# Patient Record
Sex: Female | Born: 1970
Health system: Southern US, Community
[De-identification: ages and names within clinical notes are randomized; demographics above are authoritative.]

## PROBLEM LIST (undated history)

## (undated) DIAGNOSIS — O139 Gestational [pregnancy-induced] hypertension without significant proteinuria, unspecified trimester: Secondary | ICD-10-CM

## (undated) DIAGNOSIS — Z789 Other specified health status: Secondary | ICD-10-CM

## (undated) DIAGNOSIS — E669 Obesity, unspecified: Secondary | ICD-10-CM

## (undated) DIAGNOSIS — R7303 Prediabetes: Secondary | ICD-10-CM

## (undated) DIAGNOSIS — M6740 Ganglion, unspecified site: Secondary | ICD-10-CM

## (undated) DIAGNOSIS — D649 Anemia, unspecified: Secondary | ICD-10-CM

## (undated) DIAGNOSIS — O09529 Supervision of elderly multigravida, unspecified trimester: Secondary | ICD-10-CM

## (undated) DIAGNOSIS — N200 Calculus of kidney: Secondary | ICD-10-CM

## (undated) DIAGNOSIS — E119 Type 2 diabetes mellitus without complications: Secondary | ICD-10-CM

## (undated) DIAGNOSIS — Z603 Acculturation difficulty: Secondary | ICD-10-CM

## (undated) DIAGNOSIS — Z758 Other problems related to medical facilities and other health care: Secondary | ICD-10-CM

## (undated) HISTORY — DX: Obesity, unspecified: E66.9

## (undated) HISTORY — DX: Prediabetes: R73.03

## (undated) HISTORY — PX: NO PAST SURGERIES: SHX2092

## (undated) HISTORY — DX: Ganglion, unspecified site: M67.40

## (undated) HISTORY — DX: Other problems related to medical facilities and other health care: Z75.8

## (undated) HISTORY — DX: Calculus of kidney: N20.0

## (undated) HISTORY — DX: Gestational (pregnancy-induced) hypertension without significant proteinuria, unspecified trimester: O13.9

## (undated) HISTORY — DX: Acculturation difficulty: Z60.3

## (undated) HISTORY — DX: Supervision of elderly multigravida, unspecified trimester: O09.529

## (undated) HISTORY — DX: Other specified health status: Z78.9

## (undated) HISTORY — DX: Anemia, unspecified: D64.9

## (undated) HISTORY — DX: Type 2 diabetes mellitus without complications: E11.9

---

## 2011-10-17 DIAGNOSIS — N200 Calculus of kidney: Secondary | ICD-10-CM

## 2011-10-17 HISTORY — DX: Calculus of kidney: N20.0

## 2011-10-17 NOTE — L&D Delivery Note (Signed)
I attended and precepted this delivery.  Agree with the above note.  Levie Heritage, DO 05/10/2012 8:26 PM

## 2011-10-17 NOTE — L&D Delivery Note (Signed)
Delivery Note At 7:07 PM a viable and healthy female was delivered via SVD (Presentation: left occiput anterior ).  APGAR: pending; weight pending.   Placenta status: intact.  Cord: 3 vessels with the following complications: thin cord.  Meconium present.  Anesthesia:  none Episiotomy: none Lacerations: none Suture Repair: n/a Est. Blood Loss (mL): 300  Mom to postpartum.  Baby to nursery-stable.  [name] 05/10/2012, 7:21 PM

## 2011-10-31 ENCOUNTER — Other Ambulatory Visit: Payer: Self-pay

## 2011-10-31 LAB — OB RESULTS CONSOLE RPR: RPR: NONREACTIVE

## 2011-10-31 LAB — OB RESULTS CONSOLE HIV ANTIBODY (ROUTINE TESTING): HIV: NONREACTIVE

## 2011-10-31 LAB — OB RESULTS CONSOLE GC/CHLAMYDIA: Chlamydia: NEGATIVE

## 2011-11-01 ENCOUNTER — Other Ambulatory Visit (HOSPITAL_COMMUNITY): Payer: Self-pay | Admitting: Nurse Practitioner

## 2011-11-01 DIAGNOSIS — O09529 Supervision of elderly multigravida, unspecified trimester: Secondary | ICD-10-CM

## 2011-11-01 DIAGNOSIS — Z3689 Encounter for other specified antenatal screening: Secondary | ICD-10-CM

## 2011-11-06 ENCOUNTER — Other Ambulatory Visit (HOSPITAL_COMMUNITY): Payer: Self-pay | Admitting: Nurse Practitioner

## 2011-11-06 ENCOUNTER — Ambulatory Visit (HOSPITAL_COMMUNITY)
Admission: RE | Admit: 2011-11-06 | Discharge: 2011-11-06 | Disposition: A | Discharge status: Home / Self Care | Payer: Self-pay | Source: Ambulatory Visit | Attending: Nurse Practitioner | Admitting: Nurse Practitioner

## 2011-11-06 DIAGNOSIS — Z3689 Encounter for other specified antenatal screening: Secondary | ICD-10-CM | POA: Insufficient documentation

## 2011-11-06 DIAGNOSIS — O09529 Supervision of elderly multigravida, unspecified trimester: Secondary | ICD-10-CM

## 2011-11-21 ENCOUNTER — Other Ambulatory Visit (HOSPITAL_COMMUNITY): Payer: Self-pay | Admitting: Physician Assistant

## 2011-11-21 DIAGNOSIS — Z3689 Encounter for other specified antenatal screening: Secondary | ICD-10-CM

## 2011-12-05 ENCOUNTER — Ambulatory Visit (HOSPITAL_COMMUNITY)
Admission: RE | Admit: 2011-12-05 | Discharge: 2011-12-05 | Disposition: A | Discharge status: Home / Self Care | Payer: Self-pay | Source: Ambulatory Visit | Attending: Physician Assistant | Admitting: Physician Assistant

## 2011-12-05 ENCOUNTER — Encounter (HOSPITAL_COMMUNITY): Payer: Self-pay

## 2011-12-05 DIAGNOSIS — O358XX0 Maternal care for other (suspected) fetal abnormality and damage, not applicable or unspecified: Secondary | ICD-10-CM | POA: Insufficient documentation

## 2011-12-05 DIAGNOSIS — Z1389 Encounter for screening for other disorder: Secondary | ICD-10-CM | POA: Insufficient documentation

## 2011-12-05 DIAGNOSIS — O09299 Supervision of pregnancy with other poor reproductive or obstetric history, unspecified trimester: Secondary | ICD-10-CM | POA: Insufficient documentation

## 2011-12-05 DIAGNOSIS — O09529 Supervision of elderly multigravida, unspecified trimester: Secondary | ICD-10-CM | POA: Insufficient documentation

## 2011-12-05 DIAGNOSIS — Z363 Encounter for antenatal screening for malformations: Secondary | ICD-10-CM | POA: Insufficient documentation

## 2011-12-05 DIAGNOSIS — Z3689 Encounter for other specified antenatal screening: Secondary | ICD-10-CM

## 2011-12-19 ENCOUNTER — Other Ambulatory Visit (HOSPITAL_COMMUNITY): Payer: Self-pay | Admitting: Physician Assistant

## 2011-12-19 DIAGNOSIS — Z0489 Encounter for examination and observation for other specified reasons: Secondary | ICD-10-CM

## 2011-12-26 ENCOUNTER — Ambulatory Visit (HOSPITAL_COMMUNITY): Payer: Self-pay

## 2011-12-27 ENCOUNTER — Encounter (HOSPITAL_COMMUNITY): Payer: Self-pay

## 2011-12-27 ENCOUNTER — Ambulatory Visit (HOSPITAL_COMMUNITY)
Admission: RE | Admit: 2011-12-27 | Discharge: 2011-12-27 | Disposition: A | Discharge status: Home / Self Care | Payer: Self-pay | Source: Ambulatory Visit | Attending: Physician Assistant | Admitting: Physician Assistant

## 2011-12-27 ENCOUNTER — Other Ambulatory Visit (HOSPITAL_COMMUNITY): Payer: Self-pay | Admitting: Physician Assistant

## 2011-12-27 DIAGNOSIS — O09529 Supervision of elderly multigravida, unspecified trimester: Secondary | ICD-10-CM | POA: Insufficient documentation

## 2011-12-27 DIAGNOSIS — Z363 Encounter for antenatal screening for malformations: Secondary | ICD-10-CM | POA: Insufficient documentation

## 2011-12-27 DIAGNOSIS — O09299 Supervision of pregnancy with other poor reproductive or obstetric history, unspecified trimester: Secondary | ICD-10-CM | POA: Insufficient documentation

## 2011-12-27 DIAGNOSIS — O280 Abnormal hematological finding on antenatal screening of mother: Secondary | ICD-10-CM

## 2011-12-27 DIAGNOSIS — Z0489 Encounter for examination and observation for other specified reasons: Secondary | ICD-10-CM

## 2011-12-27 DIAGNOSIS — Z1389 Encounter for screening for other disorder: Secondary | ICD-10-CM | POA: Insufficient documentation

## 2011-12-27 DIAGNOSIS — O289 Unspecified abnormal findings on antenatal screening of mother: Secondary | ICD-10-CM | POA: Insufficient documentation

## 2011-12-27 DIAGNOSIS — O358XX0 Maternal care for other (suspected) fetal abnormality and damage, not applicable or unspecified: Secondary | ICD-10-CM | POA: Insufficient documentation

## 2011-12-27 NOTE — Progress Notes (Addendum)
Genetic Counseling  High-Risk Gestation Note  Appointment Date:  12/27/2011 Referred By: Quentin Mulling, PA Date of Birth:  [birth year]   Pregnancy History: [id number] Estimated Date of Delivery: 05/06/12 Estimated Gestational Age: [redacted]w[redacted]d Attending: Particia Nearing, MD   Rebecca Sharp was seen for genetic counseling regarding a maternal age of 77 and an increased risk for Down syndrome based on Quad screening through [facility]. Farrel Gobble, [facility] medical interpreter, translated during our session today.  She was counseled regarding maternal age and the association with risk for chromosome conditions due to nondisjunction with aging of the ova.   We reviewed chromosomes, nondisjunction, and the associated 1 in 73 risk for fetal aneuploidy related to a maternal age of 60 at 21.[redacted] weeks gestation.  She was counseled that the risk for aneuploidy decreases as gestational age increases, accounting for those pregnancies which spontaneously abort.  We specifically discussed Down syndrome (trisomy 33), trisomies 41 and 46, and sex chromosome aneuploidies (47,XXX and 47,XXY) including the common features and prognoses of each.   We also reviewed Rebecca Sharp's maternal serum Quad screen result.  Although technically screen positive for Down syndrome, we discussed that the screen adjusted risk is just slightly higher than her term age related risk (1 in 12) for fetal Down syndrome.  She was counseled regarding other explanations for a screen positive result including normal variation and differences in maternal metabolism.  In addition, we reviewed the screen adjusted reduction in risks for trisomy 18 and ONTDs.  She understands that Quad screening provides a pregnancy specific risk for Down syndrome, but is not considered to be diagnostic.    She was counseled regarding other available screening and diagnostic options including ultrasound, NIPT, and amniocentesis.  Specifically, we  discussed that We discussed another type of screening test, noninvasive prenatal testing (NIPT), which utilizes cell free fetal DNA found in the maternal circulation. This test is not diagnostic for chromosome conditions, but can provide information regarding the presence or absence of extra fetal DNA for chromosomes 13, 18 and 21. Thus, it would not identify or rule out all fetal aneuploidy. The reported detection rate is greater than 99% for Trisomy 21, greater than 97% for Trisomy 18, and is approximately 80% (8 out of 10) for Trisomy 13. The false positive rate is reported to be less than 1% for any of these conditions. The risks, benefits, and limitations of each of these options were reviewed in detail.  After thoughtful consideration of these options, they elected to proceed with ultrasound, but declined NIPT and amniocentesis.  A complete detailed ultrasound was performed today.  The ultrasound report will be documented separately.  They understand that screening tests cannot rule out all birth defects or genetic syndromes.  The patient was advised of this limitation and states she still does not want diagnostic testing at this time.  However, they were counseled that 50-80% of fetuses with Down syndrome and up to 90% of fetuses with trisomies 13 and 18, when well visualized, have detectable anomalies or soft markers by ultrasound.   Rebecca Sharp was provided with written information regarding sickle cell anemia (SCA) including the carrier frequency and incidence in the Hispanic population, the availability of carrier testing and prenatal diagnosis if indicated.  In addition, we discussed that hemoglobinopathies are routinely screened for as part of the [facility] newborn screening panel.  She declined hemoglobin electrophoresis today.   Both family histories were reviewed and found to be noncontributory for birth defects, mental  retardation, and known genetic conditions. Without further information  regarding the provided family history, an accurate genetic risk cannot be calculated. Further genetic counseling is warranted if more information is obtained.  Rebecca Sharp denied exposure to environmental toxins or chemical agents. She denied the use of alcohol, tobacco or street drugs. She denied significant viral illnesses during the course of her pregnancy.   I counseled this patient regarding the above risks and available options.  The approximate face-to-face time with the genetic counselor was 65 minutes.    Despina Arias, MS Certified Genetic Counselor

## 2012-01-02 ENCOUNTER — Other Ambulatory Visit (HOSPITAL_COMMUNITY): Payer: Self-pay | Admitting: Family

## 2012-01-02 DIAGNOSIS — R319 Hematuria, unspecified: Secondary | ICD-10-CM

## 2012-01-05 ENCOUNTER — Ambulatory Visit (HOSPITAL_COMMUNITY)
Admission: RE | Admit: 2012-01-05 | Discharge: 2012-01-05 | Disposition: A | Discharge status: Home / Self Care | Payer: Self-pay | Source: Ambulatory Visit | Attending: Family | Admitting: Family

## 2012-01-05 DIAGNOSIS — O99891 Other specified diseases and conditions complicating pregnancy: Secondary | ICD-10-CM | POA: Insufficient documentation

## 2012-01-05 DIAGNOSIS — R319 Hematuria, unspecified: Secondary | ICD-10-CM | POA: Insufficient documentation

## 2012-02-13 ENCOUNTER — Other Ambulatory Visit (HOSPITAL_COMMUNITY): Payer: Self-pay | Admitting: Family

## 2012-02-13 DIAGNOSIS — Z1389 Encounter for screening for other disorder: Secondary | ICD-10-CM

## 2012-02-21 ENCOUNTER — Ambulatory Visit (HOSPITAL_COMMUNITY): Payer: Self-pay

## 2012-02-22 ENCOUNTER — Ambulatory Visit (HOSPITAL_COMMUNITY)
Admission: RE | Admit: 2012-02-22 | Discharge: 2012-02-22 | Disposition: A | Discharge status: Home / Self Care | Payer: Self-pay | Source: Ambulatory Visit | Attending: Family | Admitting: Family

## 2012-02-22 DIAGNOSIS — O09529 Supervision of elderly multigravida, unspecified trimester: Secondary | ICD-10-CM | POA: Insufficient documentation

## 2012-02-22 DIAGNOSIS — O289 Unspecified abnormal findings on antenatal screening of mother: Secondary | ICD-10-CM | POA: Insufficient documentation

## 2012-02-22 DIAGNOSIS — Z1389 Encounter for screening for other disorder: Secondary | ICD-10-CM

## 2012-02-22 DIAGNOSIS — O09299 Supervision of pregnancy with other poor reproductive or obstetric history, unspecified trimester: Secondary | ICD-10-CM | POA: Insufficient documentation

## 2012-02-27 ENCOUNTER — Other Ambulatory Visit (HOSPITAL_COMMUNITY): Payer: Self-pay | Admitting: Family

## 2012-02-27 DIAGNOSIS — Z1389 Encounter for screening for other disorder: Secondary | ICD-10-CM

## 2012-03-22 ENCOUNTER — Ambulatory Visit (HOSPITAL_COMMUNITY)
Admission: RE | Admit: 2012-03-22 | Discharge: 2012-03-22 | Disposition: A | Discharge status: Home / Self Care | Payer: Self-pay | Source: Ambulatory Visit | Attending: Family | Admitting: Family

## 2012-03-22 DIAGNOSIS — O09299 Supervision of pregnancy with other poor reproductive or obstetric history, unspecified trimester: Secondary | ICD-10-CM | POA: Insufficient documentation

## 2012-03-22 DIAGNOSIS — O289 Unspecified abnormal findings on antenatal screening of mother: Secondary | ICD-10-CM | POA: Insufficient documentation

## 2012-03-22 DIAGNOSIS — O09529 Supervision of elderly multigravida, unspecified trimester: Secondary | ICD-10-CM | POA: Insufficient documentation

## 2012-03-22 DIAGNOSIS — Z1389 Encounter for screening for other disorder: Secondary | ICD-10-CM

## 2012-04-10 LAB — OB RESULTS CONSOLE GBS: GBS: POSITIVE

## 2012-05-06 ENCOUNTER — Encounter (HOSPITAL_COMMUNITY): Payer: Self-pay | Admitting: *Deleted

## 2012-05-06 ENCOUNTER — Other Ambulatory Visit (HOSPITAL_COMMUNITY): Payer: Self-pay | Admitting: Physician Assistant

## 2012-05-06 ENCOUNTER — Telehealth (HOSPITAL_COMMUNITY): Payer: Self-pay | Admitting: *Deleted

## 2012-05-06 DIAGNOSIS — O480 Post-term pregnancy: Secondary | ICD-10-CM

## 2012-05-06 NOTE — Telephone Encounter (Signed)
Preadmission screen Interpreter number [phone]

## 2012-05-07 NOTE — Telephone Encounter (Signed)
Interpreter on 7/23 was [phone]

## 2012-05-09 ENCOUNTER — Ambulatory Visit (HOSPITAL_COMMUNITY)
Admission: RE | Admit: 2012-05-09 | Discharge: 2012-05-09 | Disposition: A | Discharge status: Home / Self Care | Payer: Self-pay | Source: Ambulatory Visit | Attending: Physician Assistant | Admitting: Physician Assistant

## 2012-05-09 DIAGNOSIS — O09529 Supervision of elderly multigravida, unspecified trimester: Secondary | ICD-10-CM | POA: Insufficient documentation

## 2012-05-09 DIAGNOSIS — O480 Post-term pregnancy: Secondary | ICD-10-CM | POA: Insufficient documentation

## 2012-05-10 ENCOUNTER — Encounter (HOSPITAL_COMMUNITY): Payer: Self-pay | Admitting: *Deleted

## 2012-05-10 ENCOUNTER — Inpatient Hospital Stay (HOSPITAL_COMMUNITY)
Admission: AD | Admit: 2012-05-10 | Discharge: 2012-05-11 | DRG: 775 | Disposition: A | Discharge status: Home / Self Care | Payer: Medicaid Other | Source: Ambulatory Visit | Attending: Obstetrics & Gynecology | Admitting: Obstetrics & Gynecology

## 2012-05-10 DIAGNOSIS — O99892 Other specified diseases and conditions complicating childbirth: Principal | ICD-10-CM | POA: Diagnosis present

## 2012-05-10 DIAGNOSIS — Z22330 Carrier of Group B streptococcus: Secondary | ICD-10-CM

## 2012-05-10 DIAGNOSIS — IMO0001 Reserved for inherently not codable concepts without codable children: Secondary | ICD-10-CM

## 2012-05-10 DIAGNOSIS — O09529 Supervision of elderly multigravida, unspecified trimester: Secondary | ICD-10-CM

## 2012-05-10 DIAGNOSIS — O9989 Other specified diseases and conditions complicating pregnancy, childbirth and the puerperium: Secondary | ICD-10-CM

## 2012-05-10 DIAGNOSIS — IMO0002 Reserved for concepts with insufficient information to code with codable children: Secondary | ICD-10-CM

## 2012-05-10 LAB — CBC
HCT: 33.2 % — ABNORMAL LOW (ref 36.0–46.0)
Hemoglobin: 10.7 g/dL — ABNORMAL LOW (ref 12.0–15.0)
MCH: 27.9 pg (ref 26.0–34.0)
MCHC: 32.2 g/dL (ref 30.0–36.0)
RBC: 3.84 MIL/uL — ABNORMAL LOW (ref 3.87–5.11)

## 2012-05-10 MED ORDER — OXYCODONE-ACETAMINOPHEN 5-325 MG PO TABS
1.0000 | ORAL_TABLET | ORAL | Status: DC | PRN
Start: 2012-05-10 — End: 2012-05-11

## 2012-05-10 MED ORDER — OXYTOCIN 40 UNITS IN LACTATED RINGERS INFUSION - SIMPLE MED
1.0000 m[IU]/min | INTRAVENOUS | Status: DC
  Administered 2012-05-10: 2 m[IU]/min via INTRAVENOUS
  Filled 2012-05-10: qty 1000

## 2012-05-10 MED ORDER — IBUPROFEN 600 MG PO TABS
600.0000 mg | ORAL_TABLET | Freq: Four times a day (QID) | ORAL | Status: DC | PRN
  Filled 2012-05-10: qty 1

## 2012-05-10 MED ORDER — BENZOCAINE-MENTHOL 20-0.5 % EX AERO
1.0000 "application " | INHALATION_SPRAY | CUTANEOUS | Status: DC | PRN
  Administered 2012-05-11: 1 via TOPICAL
  Filled 2012-05-10: qty 56

## 2012-05-10 MED ORDER — ONDANSETRON HCL 4 MG PO TABS: 4.0000 mg | ORAL_TABLET | ORAL | Status: DC | PRN

## 2012-05-10 MED ORDER — CITRIC ACID-SODIUM CITRATE 334-500 MG/5ML PO SOLN: 30.0000 mL | ORAL | Status: DC | PRN

## 2012-05-10 MED ORDER — LANOLIN HYDROUS EX OINT: TOPICAL_OINTMENT | CUTANEOUS | Status: DC | PRN

## 2012-05-10 MED ORDER — ONDANSETRON HCL 4 MG/2ML IJ SOLN: 4.0000 mg | Freq: Four times a day (QID) | INTRAMUSCULAR | Status: DC | PRN

## 2012-05-10 MED ORDER — ACETAMINOPHEN 325 MG PO TABS: 650.0000 mg | ORAL_TABLET | ORAL | Status: DC | PRN

## 2012-05-10 MED ORDER — DIPHENHYDRAMINE HCL 25 MG PO CAPS: 25.0000 mg | ORAL_CAPSULE | Freq: Four times a day (QID) | ORAL | Status: DC | PRN

## 2012-05-10 MED ORDER — OXYTOCIN 40 UNITS IN LACTATED RINGERS INFUSION - SIMPLE MED
INTRAVENOUS | Status: AC
  Filled 2012-05-10: qty 1000

## 2012-05-10 MED ORDER — SIMETHICONE 80 MG PO CHEW: 80.0000 mg | CHEWABLE_TABLET | ORAL | Status: DC | PRN

## 2012-05-10 MED ORDER — OXYTOCIN 40 UNITS IN LACTATED RINGERS INFUSION - SIMPLE MED
62.5000 mL/h | Freq: Once | INTRAVENOUS | Status: AC
  Administered 2012-05-10: 62.5 mL/h via INTRAVENOUS

## 2012-05-10 MED ORDER — FENTANYL CITRATE 0.05 MG/ML IJ SOLN
100.0000 ug | INTRAMUSCULAR | Status: DC | PRN
  Administered 2012-05-10 (×2): 100 ug via INTRAVENOUS
  Filled 2012-05-10 (×2): qty 2

## 2012-05-10 MED ORDER — DIBUCAINE 1 % RE OINT: 1.0000 "application " | TOPICAL_OINTMENT | RECTAL | Status: DC | PRN

## 2012-05-10 MED ORDER — ONDANSETRON HCL 4 MG/2ML IJ SOLN: 4.0000 mg | INTRAMUSCULAR | Status: DC | PRN

## 2012-05-10 MED ORDER — ZOLPIDEM TARTRATE 5 MG PO TABS: 5.0000 mg | ORAL_TABLET | Freq: Every evening | ORAL | Status: DC | PRN

## 2012-05-10 MED ORDER — FLEET ENEMA 7-19 GM/118ML RE ENEM: 1.0000 | ENEMA | RECTAL | Status: DC | PRN

## 2012-05-10 MED ORDER — TETANUS-DIPHTH-ACELL PERTUSSIS 5-2.5-18.5 LF-MCG/0.5 IM SUSP: 0.5000 mL | Freq: Once | INTRAMUSCULAR | Status: DC

## 2012-05-10 MED ORDER — DEXTROSE 5 % IV SOLN
5.0000 10*6.[IU] | Freq: Once | INTRAVENOUS | Status: AC
  Administered 2012-05-10: 5 10*6.[IU] via INTRAVENOUS
  Filled 2012-05-10: qty 5

## 2012-05-10 MED ORDER — WITCH HAZEL-GLYCERIN EX PADS: 1.0000 "application " | MEDICATED_PAD | CUTANEOUS | Status: DC | PRN

## 2012-05-10 MED ORDER — DEXTROSE 5 % IV SOLN
2.5000 10*6.[IU] | INTRAVENOUS | Status: DC
  Filled 2012-05-10 (×3): qty 2.5

## 2012-05-10 MED ORDER — OXYTOCIN BOLUS FROM INFUSION
250.0000 mL | Freq: Once | INTRAVENOUS | Status: DC
  Filled 2012-05-10: qty 500

## 2012-05-10 MED ORDER — LACTATED RINGERS IV SOLN: INTRAVENOUS | Status: DC

## 2012-05-10 MED ORDER — SENNOSIDES-DOCUSATE SODIUM 8.6-50 MG PO TABS: 2.0000 | ORAL_TABLET | Freq: Every day | ORAL | Status: DC

## 2012-05-10 MED ORDER — PRENATAL MULTIVITAMIN CH
1.0000 | ORAL_TABLET | Freq: Every day | ORAL | Status: DC
  Administered 2012-05-11: 1 via ORAL
  Filled 2012-05-10: qty 1

## 2012-05-10 MED ORDER — TERBUTALINE SULFATE 1 MG/ML IJ SOLN: 0.2500 mg | Freq: Once | INTRAMUSCULAR | Status: DC | PRN

## 2012-05-10 MED ORDER — IBUPROFEN 600 MG PO TABS
600.0000 mg | ORAL_TABLET | Freq: Four times a day (QID) | ORAL | Status: DC
  Administered 2012-05-10 – 2012-05-11 (×3): 600 mg via ORAL
  Filled 2012-05-10 (×2): qty 1

## 2012-05-10 MED ORDER — LIDOCAINE HCL (PF) 1 % IJ SOLN
30.0000 mL | INTRAMUSCULAR | Status: DC | PRN
  Filled 2012-05-10 (×2): qty 30

## 2012-05-10 MED ORDER — LACTATED RINGERS IV SOLN: 500.0000 mL | INTRAVENOUS | Status: DC | PRN

## 2012-05-10 MED ORDER — OXYCODONE-ACETAMINOPHEN 5-325 MG PO TABS: 1.0000 | ORAL_TABLET | ORAL | Status: DC | PRN

## 2012-05-10 NOTE — H&P (Signed)
Patient seen and examined.  Agree with above note.  Levie Heritage, DO 05/10/2012 4:09 PM

## 2012-05-10 NOTE — H&P (Signed)
Rebecca Sharp is a 41 y.o. female presenting for onset of contractions. States contractions started around 7:30 am and have been every 4-6 minutes.  They are getting closer together.  She has not had loss of fluid.  Has had bloody show.  Has a history of pregnancy induced HTN, but has had no issues with this pregnancy. She denies any other complaints.  Maternal Medical History:  Reason for admission: Reason for admission: contractions.  Contractions: Onset was 3-5 hours ago.   Frequency: irregular.   Perceived severity is moderate.    Fetal activity: Perceived fetal activity is normal.   Last perceived fetal movement was within the past hour.    Prenatal Complications - Diabetes: none.    OB History    Grav Para Term Preterm Abortions TAB SAB Ect Mult Living   3 2 2  0 0 0 0 0 0 2     Past Medical History  Diagnosis Date  . Pregnancy induced hypertension     at end of last 2 pregnancies  . AMA (advanced maternal age) multigravida 35+   . Language Barrier   . Chronic kidney disease     stones  . Obese   . Anemia    Past Surgical History  Procedure Date  . No past surgeries    Family History: family history includes Diabetes in her father. Social History:  reports that she has never smoked. She has never used smokeless tobacco. She reports that she does not drink alcohol or use illicit drugs.   Prenatal Transfer Tool  Maternal Diabetes: No Genetic Screening: Most recent AFP elevated, slightly increased risk for Down Syndrome Maternal Ultrasounds/Referrals: Normal Fetal Ultrasounds or other Referrals:  None Maternal Substance Abuse:  No Significant Maternal Medications:  None Significant Maternal Lab Results:  Lab values include: Group B Strep positive Other Comments:  None  ROS negative except per HPI  Dilation: 2.5 Effacement (%): 50 Station: -3 Exam by:: Dr. Birdie Sons, J. Lowe RN Cervix unchanged on recheck. Blood pressure 127/70, pulse 84, temperature  97.9 F (36.6 C), temperature source Oral, resp. rate 18, height 4' 11.5" (1.511 m), weight 88.905 kg (196 lb), last menstrual period 08/01/2011. Exam Physical Exam  Constitutional: She is oriented to person, place, and time. She appears well-developed and well-nourished.  HENT:  Head: Normocephalic and atraumatic.  Cardiovascular: Normal rate, regular rhythm and normal heart sounds.   Respiratory: Effort normal and breath sounds normal.  GI: Soft. There is no tenderness.       Gravid abdomen  Neurological: She is alert and oriented to person, place, and time.  Psychiatric: She has a normal mood and affect.    FHT: 145, moderate, no accels, prolonged decel seen with a couple of late decels as well, contractions every 5-6 minutes  Prenatal labs: ABO, Rh: B/Positive/-- (01/15 0000) Antibody: Negative (01/15 0000) Rubella: Immune (01/15 0000) RPR: Nonreactive (01/15 0000)  HBsAg: Negative (01/15 0000)  HIV: Non-reactive (01/15 0000)  GBS: Positive (06/26 0000)   Assessment/Plan: Patient is a 41 yo G3P2002 at 40.4 EGA presenting with onset of contractions.  Patient will be admitted to birthing suite for augmentation of labor given prolonged decel. Will start pitocin for labor augmentation. Penicillin for GBS positive. Will support patient through labor.  [name] 05/10/2012, 11:35 AM

## 2012-05-10 NOTE — Progress Notes (Signed)
Rebecca Sharp is a 41 y.o. G3P2002 at [redacted]w[redacted]d by LMP admitted for active labor  Subjective: Patient doing well. No complaints.  Objective: BP 117/71  Pulse 78  Temp 97.7 F (36.5 C) (Oral)  Resp 18  Ht 4' 11.5" (1.511 m)  Wt 88.905 kg (196 lb)  BMI 38.92 kg/m2  LMP 08/01/2011      FHT:  FHR: 145 bpm, variability: moderate,  accelerations:  Abscent,  decelerations:  Present early variables UC:   regular, every 5 minutes SVE:   Dilation: 3.5 Effacement (%): 80 Station: -3 Exam by:: [employer] RNC  Labs: No results found for this basename: WBC, HGB, HCT, MCV, PLT    Assessment / Plan: Augmentation of labor, progressing well  Labor: Pitocin not started yet, SROM with meconium Preeclampsia:  no signs or symptoms of toxicity Fetal Wellbeing:  Category II Pain Control:  Labor support without medications I/D:  n/a Anticipated MOD:  NSVD  [name] 05/10/2012, 4:46 PM

## 2012-05-10 NOTE — Plan of Care (Signed)
Assumed care of pt @ 1440; pt called out with c/o SROM- cardio readj to find fhr recovering from decel- fhr indeterminate for prior 10 min.

## 2012-05-10 NOTE — Progress Notes (Signed)
[facility] Department of Clinical Social Work Documentation of Interpretation   I assisted ___Judy  RN________________ with interpretation of ____questions__________________ for this patient. 

## 2012-05-10 NOTE — MAU Note (Signed)
Contractions started this morning, coming every 6 min,  Small amt of bleeding, no water leaking. Taken to rm for eval.

## 2012-05-10 NOTE — Plan of Care (Signed)
Modification to prior note: assumed care of pt at 1430.

## 2012-05-11 LAB — RPR: RPR Ser Ql: NONREACTIVE

## 2012-05-11 MED ORDER — IBUPROFEN 600 MG PO TABS: 600.0000 mg | ORAL_TABLET | Freq: Four times a day (QID) | ORAL | Status: AC

## 2012-05-11 NOTE — Discharge Summary (Signed)
Obstetric Discharge Summary Reason for Admission: onset of labor Prenatal Procedures: ultrasound Intrapartum Procedures: spontaneous vaginal delivery and GBS prophylaxis Postpartum Procedures: none Complications-Operative and Postpartum: none Hemoglobin  Date Value Range Status  05/10/2012 10.7* 12.0 - 15.0 g/dL Final     HCT  Date Value Range Status  05/10/2012 33.2* 36.0 - 46.0 % Final    Physical Exam:  General: alert and cooperative Lochia: appropriate Uterine Fundus: firm Incision: n/a DVT Evaluation: No evidence of DVT seen on physical exam. Negative Homan's sign. No cords or calf tenderness. No significant calf/ankle edema.  Discharge Diagnoses: Term Pregnancy-delivered  Discharge Information: Date: 05/11/2012 Activity: unrestricted Diet: routine Medications: PNV and Ibuprofen Condition: stable Instructions: refer to practice specific booklet Discharge to: home Follow-up Information    Please follow up. (Please follow-up with your OB within 6 weeks of discharge)          Newborn Data: Live born female  Birth Weight: 7 lb 2.3 oz (3240 g) APGAR: 8, 9  Home with mother.  [name] 05/11/2012, 7:39 AM I have seen/examed this patient and agree with the above assessment and plan. Plumer Mittelstaedt E.

## 2012-05-13 NOTE — Progress Notes (Signed)
Post discharge chart review completed.  

## 2012-05-15 ENCOUNTER — Inpatient Hospital Stay (HOSPITAL_COMMUNITY): Admission: RE | Admit: 2012-05-15 | Payer: Self-pay | Source: Ambulatory Visit

## 2012-05-16 NOTE — Discharge Summary (Signed)
Agree with above note.  Rebecca Sharp 05/16/2012 9:55 AM

## 2013-06-26 ENCOUNTER — Ambulatory Visit: Payer: Medicaid Other

## 2013-06-26 ENCOUNTER — Ambulatory Visit: Payer: Medicaid Other | Admitting: Internal Medicine

## 2013-10-14 IMAGING — US US OB COMP +14 WK
1 series · 12 of 23 positions shown · non-contrast
Comparison: none

[Series 1: us ob detail +14 wk · 12 of 23 slices shown]
[im 1/23]
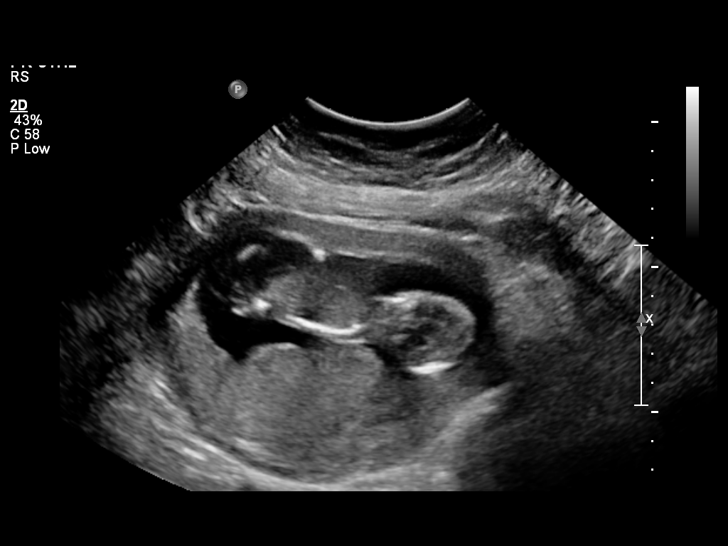
[im 3/23]
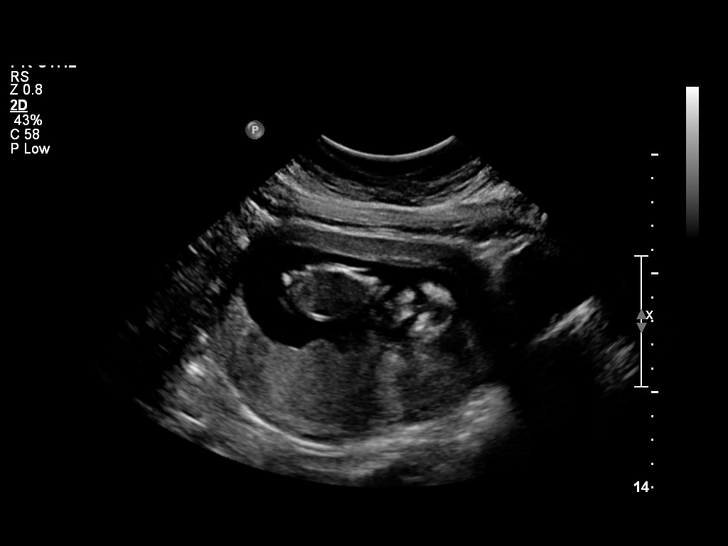
[im 5/23]
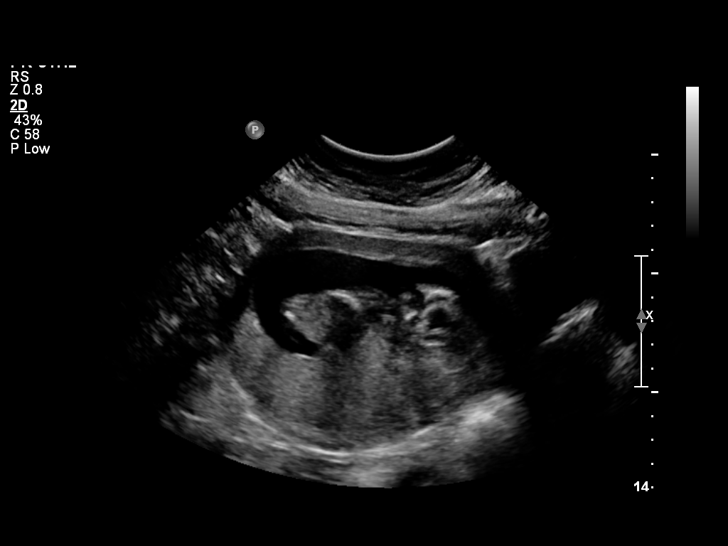
[im 7/23]
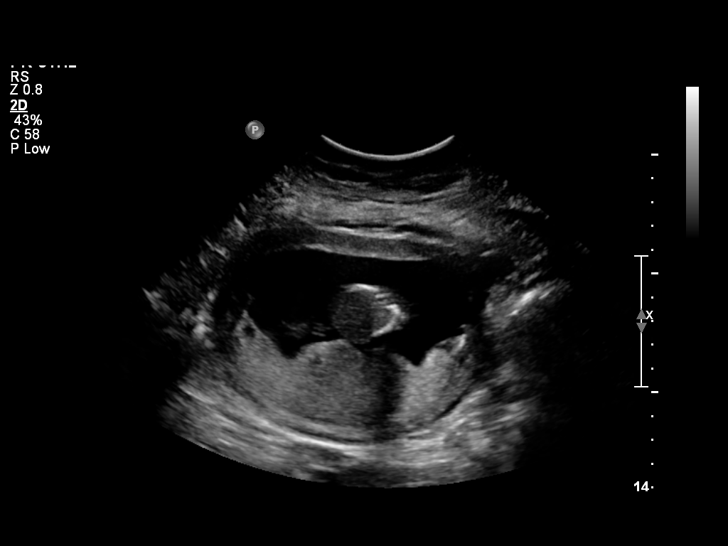
[im 9/23]
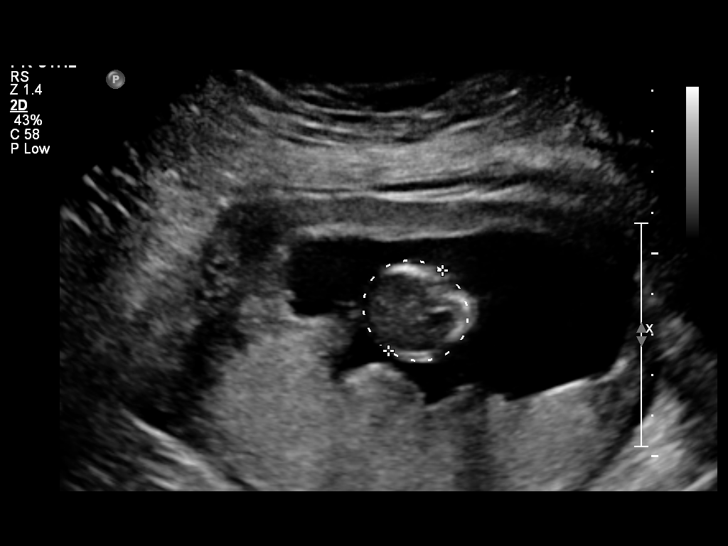
[im 11/23]
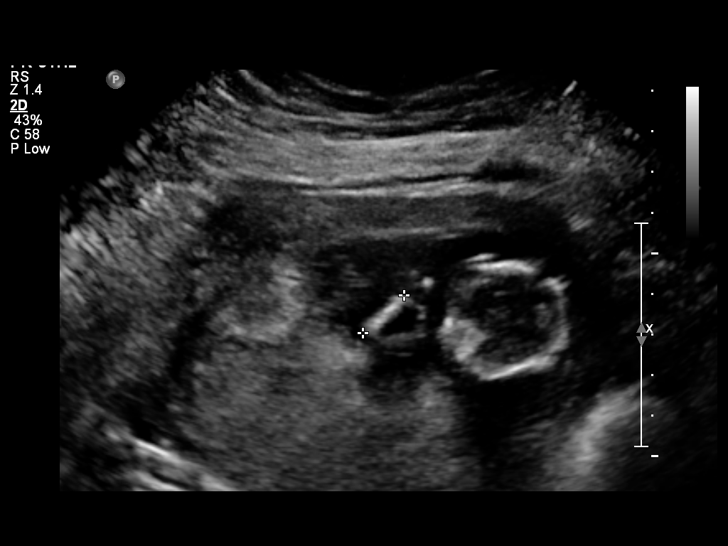
[im 13/23]
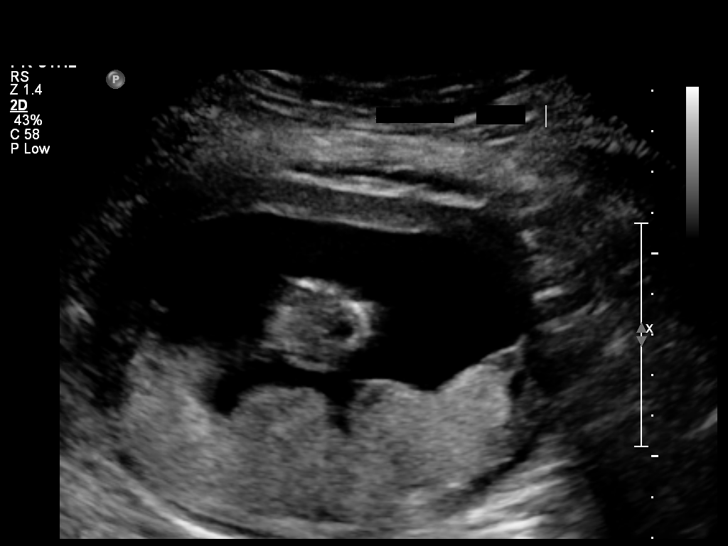
[im 15/23]
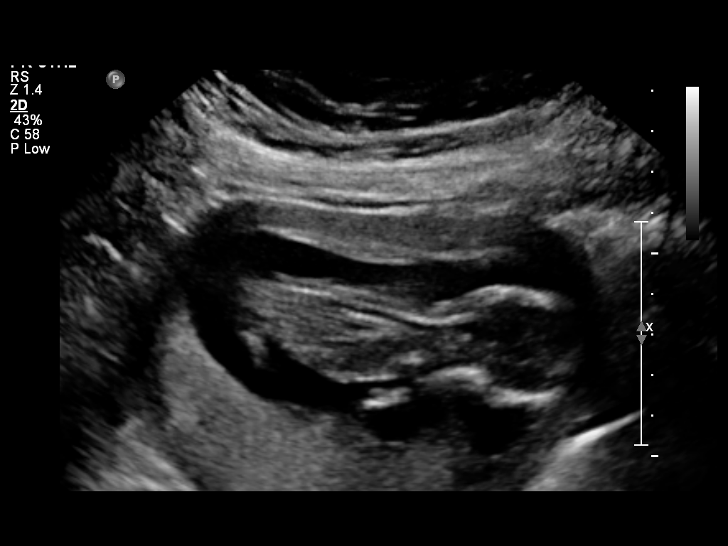
[im 17/23]
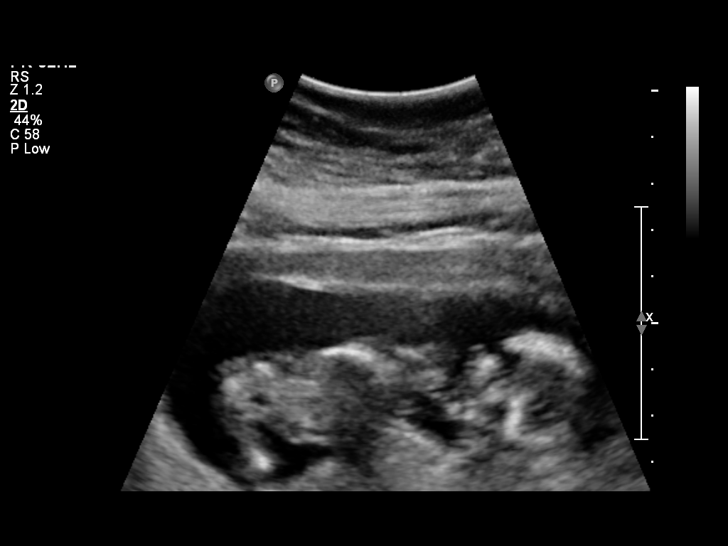
[im 19/23]
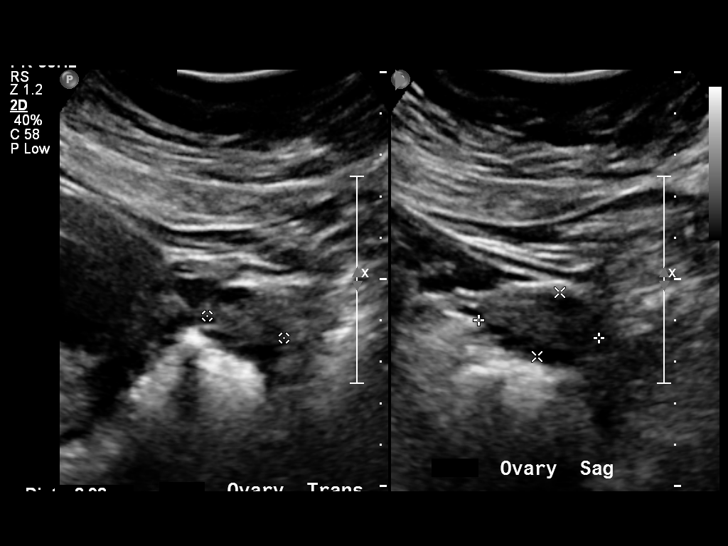
[im 21/23]
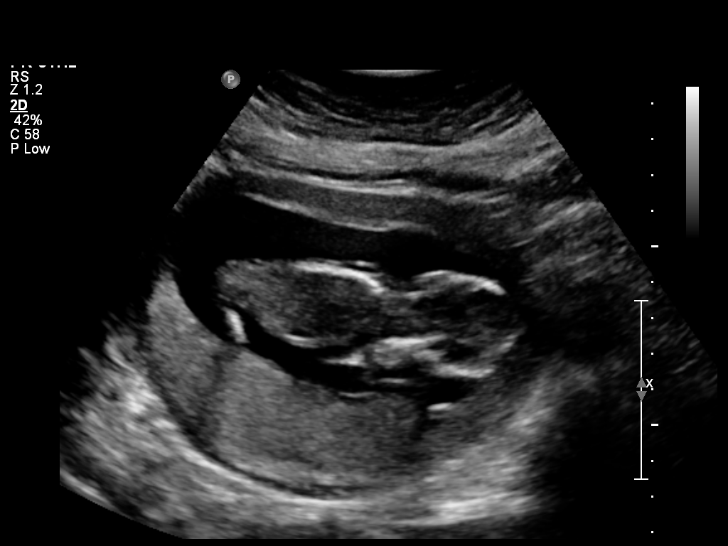
[im 23/23]
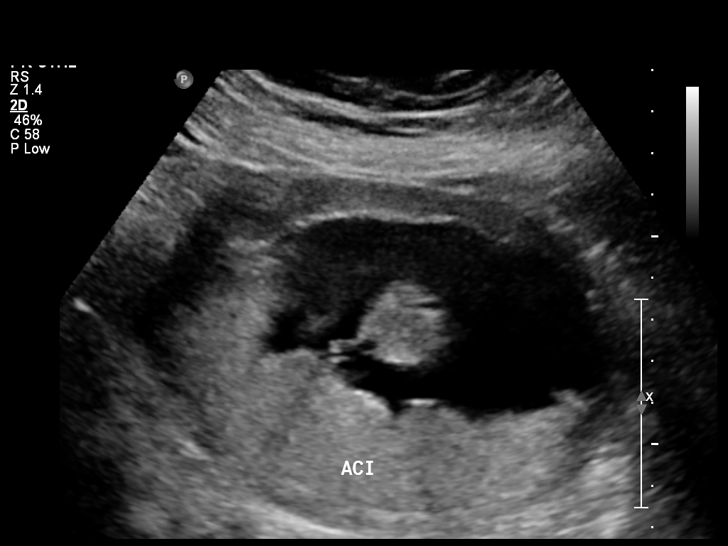

[12 of 23 positions shown; findings below may reference images not displayed]

OBSTETRICS REPORT
                      (Signed Final 11/06/2011 [DATE])

 Order#:         [id number]
Procedures

 US OB COMP + 14 WK                                    76805.1
Indications

 Unsure of LMP;  Establish Gestational [AGE]
Fetal Evaluation

 Fetal Heart Rate:  153                         bpm
 Cardiac Activity:  Observed
 Presentation:      Cephalic
 Placenta:          Posterior
 P. Cord            Not well visualized
 Insertion:

 Amniotic Fluid
 AFI FV:      Subjectively within normal limits
Biometry

 BPD:     24.2  mm    G. Age:   14w 1d                CI:        70.48   70 - 86
                                                      FL/HC:
 HC:      91.9  mm    G. Age:   14w 1d                HC/AC:      1.16   1.14 -

 AC:      78.9  mm    G. Age:   14w 2d                FL/BPD:
 FL:      12.4  mm    G. Age:   13w 4d                FL/AC:      15.7   20 - 24
 HUM:     13.7  mm    G. Age:   13w 6d
 Est. FW:      88  gm      0 lb 3 oz
Gestational Age

 LMP:           16w 5d       Date:   07/12/11                 EDD:   04/17/12
 U/S Today:     14w 0d                                        EDD:   05/06/12
 Best:          14w 0d    Det. By:   U/S (11/06/11)           EDD:   05/06/12
Anatomy

 Cranium:           Appears normal      Aortic Arch:       Basic anatomy
                                                           exam per order
 Fetal Cavum:       Not well            Ductal Arch:       Basic anatomy
                    visualized                             exam per order
 Ventricles:        Not well            Diaphragm:         Basic anatomy
                    visualized                             exam per order
 Choroid Plexus:    Appears normal      Stomach:           Appears
                                                           normal, left
                                                           sided
 Cerebellum:        Not well            Abdomen:           Not well
                    visualized                             visualized
 Posterior Fossa:   Not well            Abdominal Wall:    Appears nml
                    visualized                             (cord insert,
                                                           abd wall)
 Nuchal Fold:       Not well            Cord Vessels:      Not well
                    visualized                             visualized
 Face:              Not well            Kidneys:           Not well
                    visualized                             visualized
 Heart:             Not well            Bladder:           Appears normal
                    visualized
 RVOT:              Not well            Spine:             Not well
                    visualized                             visualized
 LVOT:              Not well            Limbs:             Not well
                    visualized                             visualized

 Other:     Technically difficult due to early GA.
Cervix Uterus Adnexa

 Cervix:       Closed.

 Left Ovary:   Within normal limits. 2.9cm x 1.6cm x 1.9cm
 Right Ovary:  Within normal limits. 2.3cm x 1.8cm x 1.9cm
 Adnexa:     No abnormality visualized.
Impression

 Single living IUP with US Gest. Age of 14w 0d, and EDD of
 05/06/2012.
 Suboptimal evaluation of anatomy due to early GA, but no
 early fetal anomalies visualized.
Recommendations

 Followup OB US for fetal anatomic evaluation at 18-19 wks
 GA.

## 2014-01-30 IMAGING — US US OB FOLLOW-UP
1 series · 12 of 28 positions shown · non-contrast
Comparison: none

[Series 1: us ob follow up · 12 of 47 slices shown]
[im 2/47]
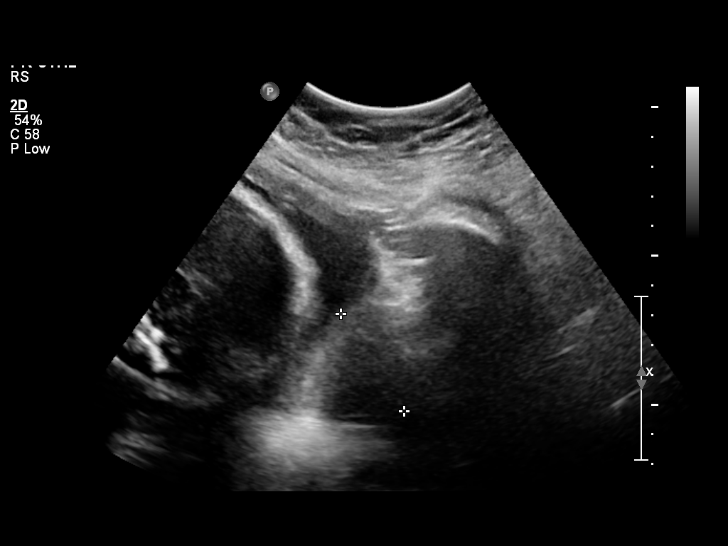
[im 6/47]
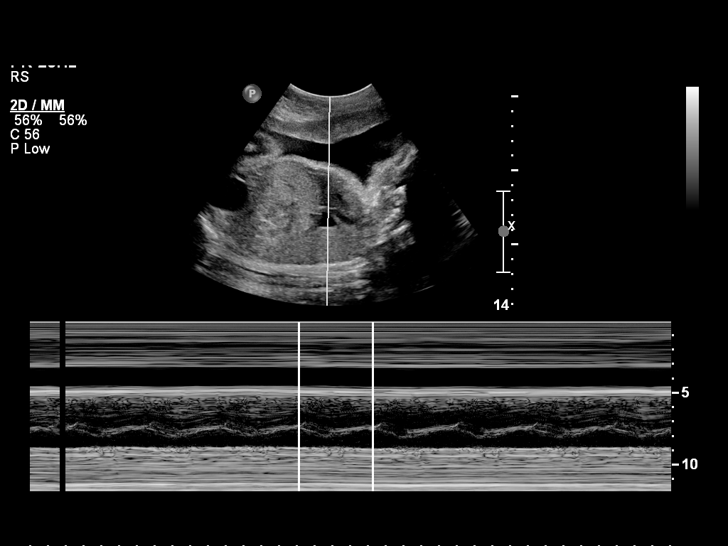
[im 9/47]
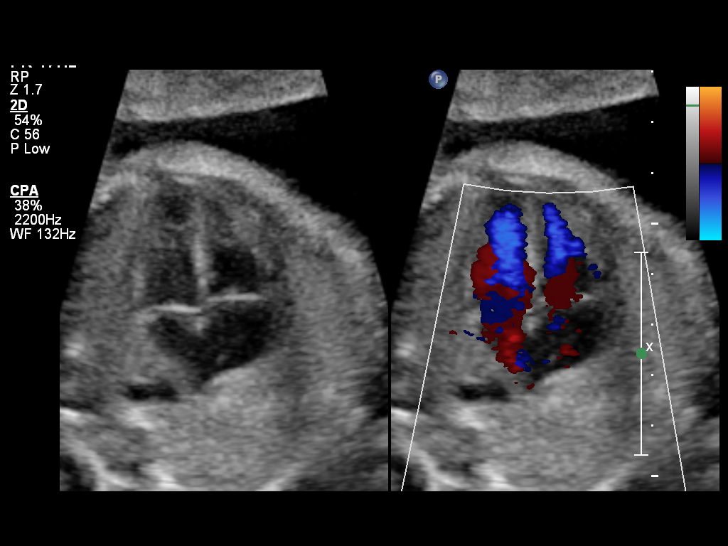
[im 14/47]
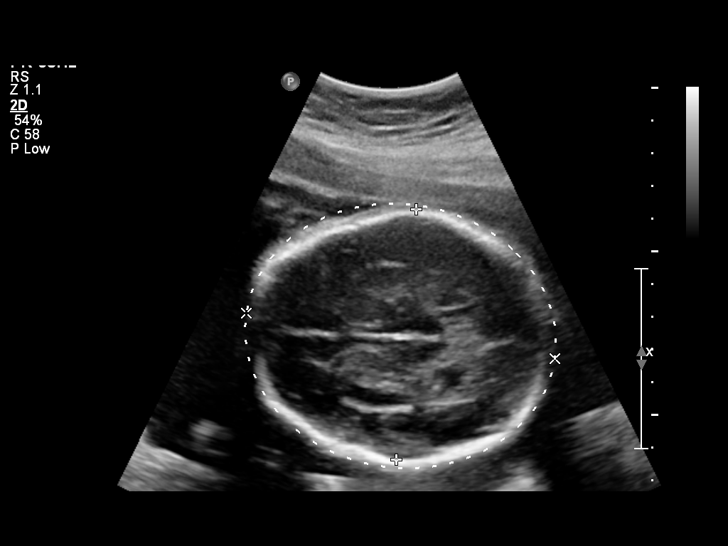
[im 18/47]
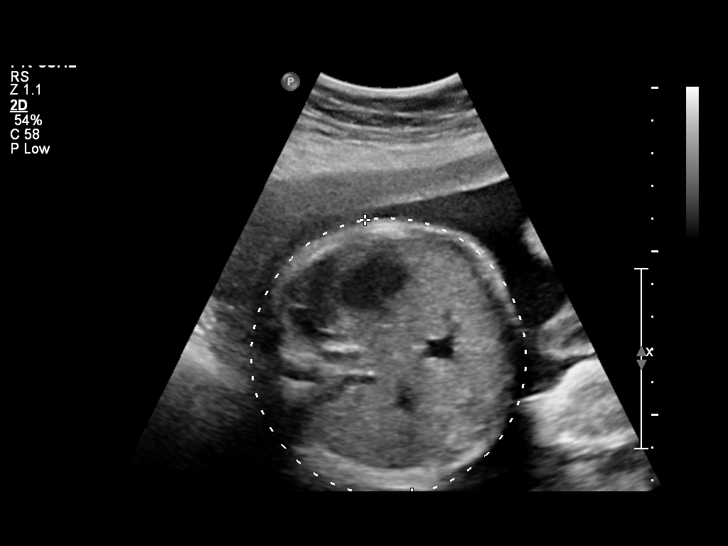
[im 21/47]
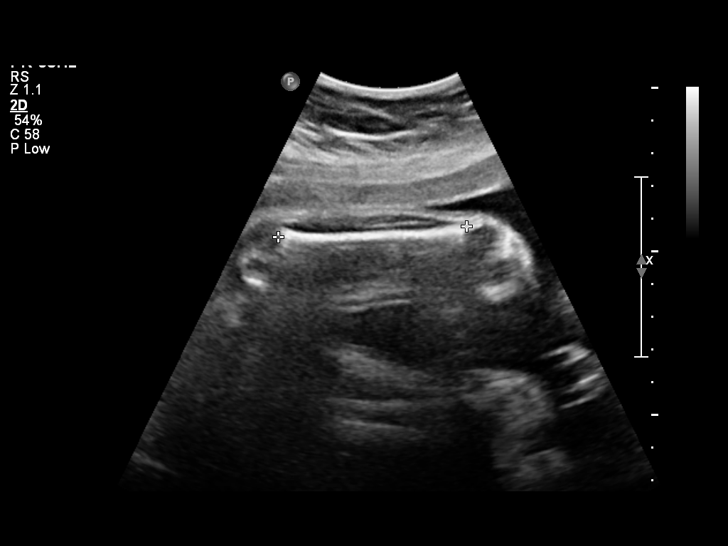
[im 26/47]
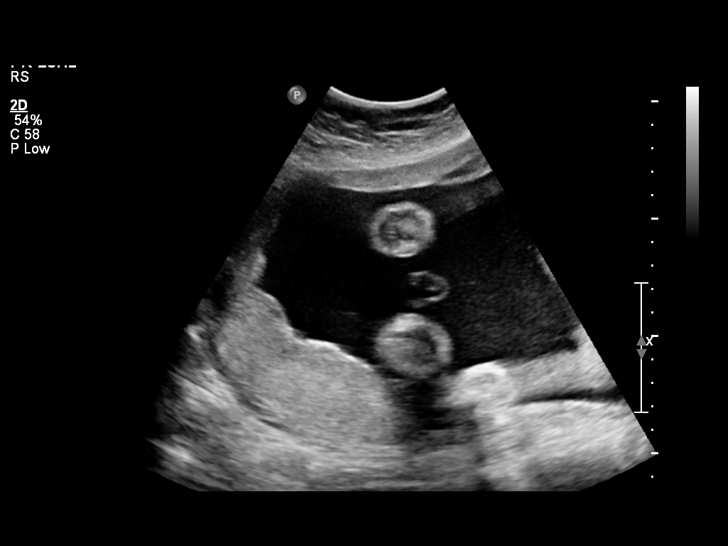
[im 29/47]
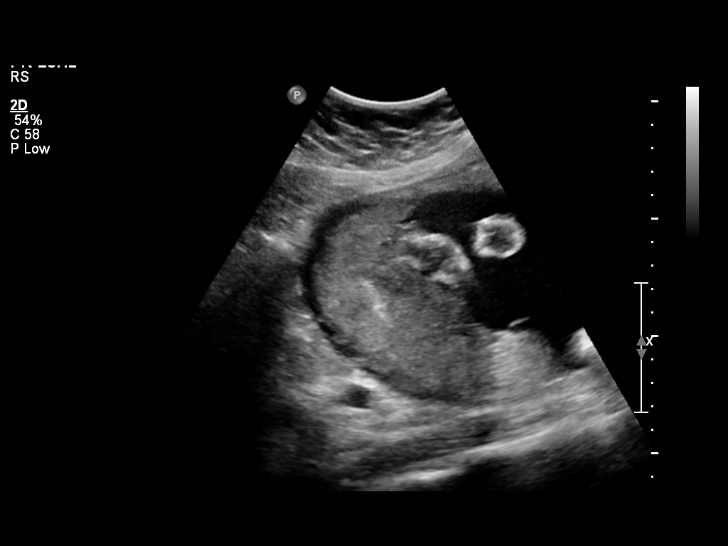
[im 33/47]
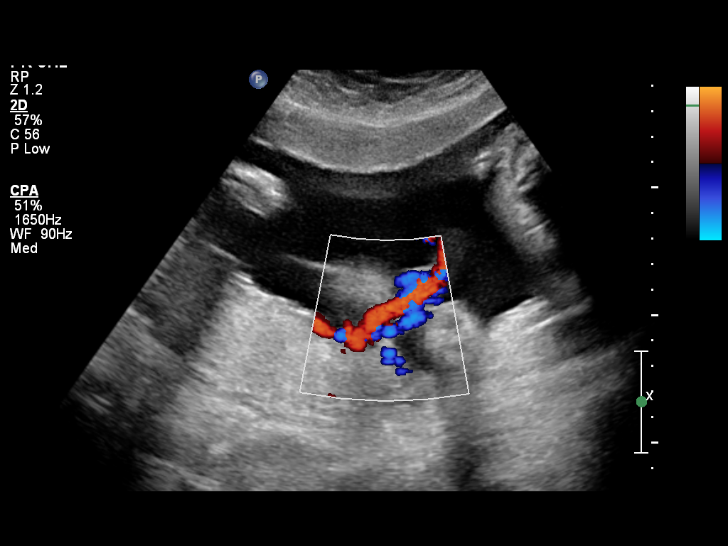
[im 38/47]
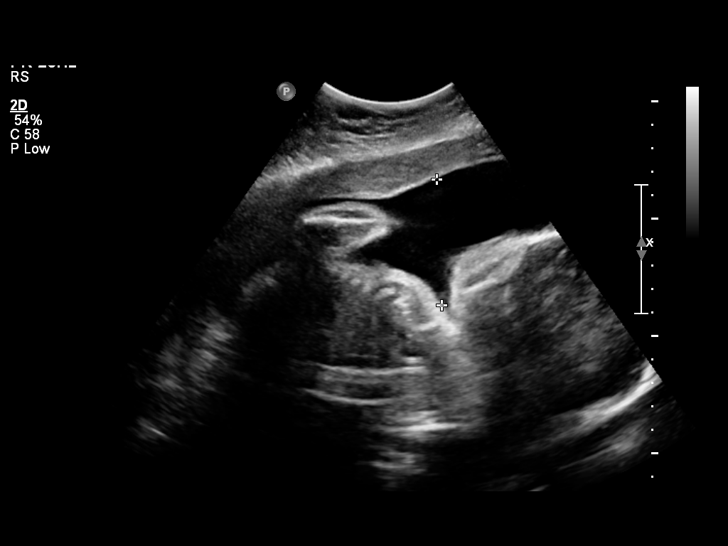
[im 41/47]
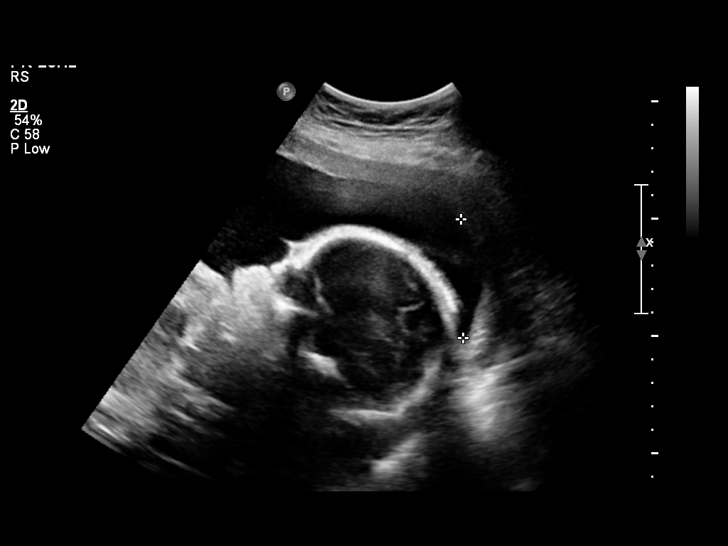
[im 45/47]
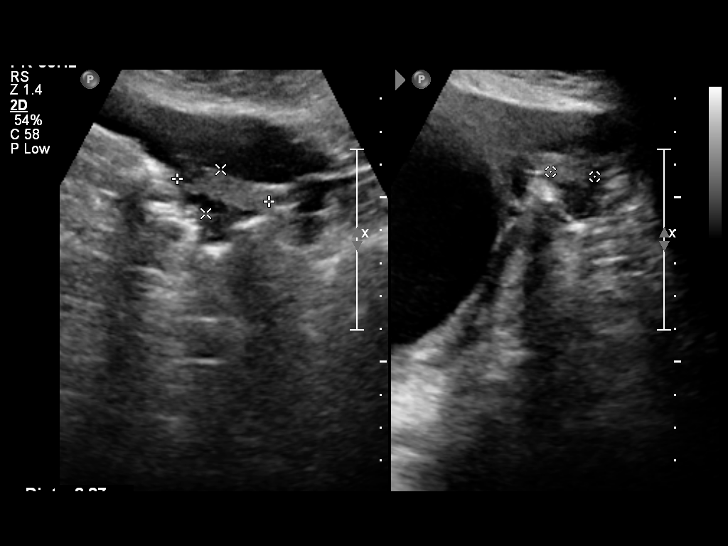

[12 of 28 positions shown; findings below may reference images not displayed]

OBSTETRICS REPORT
                      (Signed Final 02/22/2012 [DATE])

 Order#:         [id number]
Procedures

 US OB FOLLOW UP                                       76816.1
Indications

 Assess Fetal Growth / Estimated Fetal Weight
 Poor obstetrical history (previous gestational HTN)
 Advanced maternal age (AMA), Multigravida
 Abnormal biochemical screen (quad) for Trisomy
 21
 Increased risk of Down Syndrome ([DATE])
Fetal Evaluation

 Fetal Heart Rate:  144                         bpm
 Cardiac Activity:  Observed
 Presentation:      Cephalic
 Placenta:          Posterior Fundal, above
                    cervical os
 P. Cord            Marginal insertion
 Insertion:

 Amniotic Fluid
 AFI FV:      Subjectively within normal limits
 AFI Sum:     23.12   cm      94   %Tile     Larg Pckt:   8.22   cm
 RUQ:   4.5    cm    RLQ:    8.22   cm    LUQ:   5.05    cm   LLQ:    5.35   cm
Biometry

 BPD:     76.2  mm    G. Age:   30w 4d                CI:        76.61   70 - 86
                                                      FL/HC:      20.6   19.6 -

 HC:     275.8  mm    G. Age:   30w 1d       37  %    HC/AC:      1.05   0.99 -

 AC:     262.5  mm    G. Age:   30w 2d       73  %    FL/BPD:     74.7   71 - 87
 FL:      56.9  mm    G. Age:   29w 6d       48  %    FL/AC:      21.7   20 - 24

 Est. FW:    0035  gm      3 lb 6 oz     67  %
Gestational Age

 LMP:           32w 1d       Date:   07/12/11                 EDD:   04/17/12
 U/S Today:     30w 2d                                        EDD:   04/30/12
 Best:          29w 3d    Det. By:   U/S (11/06/11)           EDD:   05/06/12
Anatomy

 Cranium:           Appears normal      Aortic Arch:       Previously seen
 Fetal Cavum:       Appears normal      Ductal Arch:       Appears normal
 Ventricles:        Appears normal      Diaphragm:         Previously seen
 Choroid Plexus:    Previously seen     Stomach:           Appears
                                                           normal, left
                                                           sided
 Cerebellum:        Previously seen     Abdomen:           Appears normal
 Posterior Fossa:   Previously seen     Abdominal Wall:    Previously seen
 Nuchal Fold:       Previously seen     Cord Vessels:      Previously seen
 Face:              Appears normal      Kidneys:           Appear normal
                    (lips/profile/orbit
                    s)
 Heart:             Appears normal      Bladder:           Appears normal
                    (4 chamber &
                    axis)
 RVOT:              Previously seen     Spine:             Previously seen
 LVOT:              Previously seen     Limbs:             Previously seen
Cervix Uterus Adnexa

 Cervical Length:   3.4       cm

 Cervix:       Normal appearance by transabdominal scan.
 Uterus:       No abnormality visualized.
 Cul De Sac:   No free fluid seen.
 Left Ovary:   Within normal limits.
 Right Ovary:  Within normal limits.

 Adnexa:     No abnormality visualized.
Impression

 Single living intrauterine gestation in cephalic presentation
 with concordant gestational age and fetal indices.  The EFW
 today is at the 67th percentile.  Normal amniotic fluid volume.

 Marginal insertion of umbilical cord into placenta, 1.6 cm from
 edge.

## 2014-08-17 ENCOUNTER — Encounter (HOSPITAL_COMMUNITY): Payer: Self-pay | Admitting: *Deleted

## 2015-07-22 ENCOUNTER — Ambulatory Visit (INDEPENDENT_AMBULATORY_CARE_PROVIDER_SITE_OTHER): Payer: Self-pay | Admitting: Internal Medicine

## 2015-07-22 ENCOUNTER — Encounter: Payer: Self-pay | Admitting: Internal Medicine

## 2015-07-22 VITALS — BP 144/92 | HR 76 | Ht 59.5 in | Wt 196.0 lb

## 2015-07-22 DIAGNOSIS — R109 Unspecified abdominal pain: Secondary | ICD-10-CM

## 2015-07-22 DIAGNOSIS — I10 Essential (primary) hypertension: Secondary | ICD-10-CM

## 2015-07-22 DIAGNOSIS — N200 Calculus of kidney: Secondary | ICD-10-CM | POA: Insufficient documentation

## 2015-07-22 MED ORDER — LISINOPRIL-HYDROCHLOROTHIAZIDE 10-12.5 MG PO TABS: 1.0000 | ORAL_TABLET | Freq: Every day | ORAL | Status: DC

## 2015-07-22 NOTE — Patient Instructions (Signed)
Please let us know if you start a relationship.   You do not want to get pregnant with your new blood pressure medication. Once you start Lisinopril/HCTZ, stop the  hydrochlorothiazide you are taking now.

## 2015-07-22 NOTE — Progress Notes (Signed)
   Subjective:    Patient ID: Rebecca Sharp, female    DOB: April 02, 1971, 44 y.o.   MRN: [id number]  Hypertension Chronicity: Follow up of Hypertension.  Hx of PIH with all 3 pregnancies.  Started on HCTZ 12.5 mg  on 06/23/2015. Episode onset: 2 months ago noted to have high bp during physical at [facility]. The problem has been gradually improving since onset. Pertinent negatives include no chest pain, headaches or shortness of breath. The current treatment provides mild improvement. There are no compliance problems (Though patient did not bring her medication with her today to confirm.).       Review of Systems  Respiratory: Negative for shortness of breath.   Cardiovascular: Negative for chest pain.  Neurological: Negative for headaches.       Objective:   Physical Exam  Constitutional: No distress.  obese  Eyes: EOM are normal. Pupils are equal, round, and reactive to light.  Cardiovascular: Normal rate, regular rhythm and normal heart sounds.  Exam reveals no friction rub.   No murmur heard. Pulmonary/Chest: Breath sounds normal.  Abdominal: Soft. Bowel sounds are normal.  Musculoskeletal:       Right ankle: She exhibits no swelling.       Left ankle: She exhibits no swelling.          Assessment & Plan:  1.  Hypertension:  Improved, but still not at goal.   Add Lisinopril 10 mg to HCTZ 12.5 mg for combination pill.  Discussed importance of not getting pregnant while taking this combination medication as can cause severe problems with fetus/child. She agrees to notify the office if she is contemplating relationship/intercourse to discuss birth control.  Not interested in birth control other than condoms at this time. Previously discussed improved diet and physical activity.  Currently not in a relationship and specifically does not want a relationship.  BMET today after taking HCTZ for 1 month. NUrse visit with bp check and BMet in 2 weeks after starting Lisinopril/HCTZ  combination.

## 2015-07-23 LAB — BASIC METABOLIC PANEL
BUN/Creatinine Ratio: 16 (ref 9–23)
BUN: 11 mg/dL (ref 6–24)
CO2: 20 mmol/L (ref 18–29)
Calcium: 9 mg/dL (ref 8.7–10.2)
Chloride: 99 mmol/L (ref 97–108)
Creatinine, Ser: 0.68 mg/dL (ref 0.57–1.00)
GFR calc Af Amer: 124 mL/min/{1.73_m2} (ref >59)
GFR calc non Af Amer: 107 mL/min/{1.73_m2} (ref >59)
GLUCOSE: 89 mg/dL (ref 65–99)
POTASSIUM: 4.4 mmol/L (ref 3.5–5.2)
SODIUM: 136 mmol/L (ref 134–144)

## 2015-07-30 NOTE — Progress Notes (Signed)
Quick Note:  07/30/15 9:04am Patient informed of results and reminded of appointment on 08/05/15 @ 10:00am. ______

## 2015-08-05 ENCOUNTER — Ambulatory Visit (INDEPENDENT_AMBULATORY_CARE_PROVIDER_SITE_OTHER): Payer: Self-pay

## 2015-08-05 ENCOUNTER — Other Ambulatory Visit: Payer: No Typology Code available for payment source | Admitting: Internal Medicine

## 2015-08-05 VITALS — BP 136/86 | HR 72

## 2015-08-05 DIAGNOSIS — I10 Essential (primary) hypertension: Secondary | ICD-10-CM

## 2015-08-06 LAB — BASIC METABOLIC PANEL
BUN / CREAT RATIO: 19 (ref 9–23)
BUN: 14 mg/dL (ref 6–24)
CHLORIDE: 104 mmol/L (ref 97–106)
CO2: 20 mmol/L (ref 18–29)
Calcium: 9.4 mg/dL (ref 8.7–10.2)
Creatinine, Ser: 0.74 mg/dL (ref 0.57–1.00)
GFR calc Af Amer: 115 mL/min/{1.73_m2} (ref >59)
GFR calc non Af Amer: 100 mL/min/{1.73_m2} (ref >59)
GLUCOSE: 105 mg/dL — AB (ref 65–99)
Potassium: 4.3 mmol/L (ref 3.5–5.2)
Sodium: 142 mmol/L (ref 136–144)

## 2015-08-19 ENCOUNTER — Ambulatory Visit (INDEPENDENT_AMBULATORY_CARE_PROVIDER_SITE_OTHER): Payer: Self-pay | Admitting: Internal Medicine

## 2015-08-19 ENCOUNTER — Encounter: Payer: Self-pay | Admitting: Internal Medicine

## 2015-08-19 VITALS — BP 136/88 | HR 78 | Ht 59.5 in | Wt 192.0 lb

## 2015-08-19 DIAGNOSIS — R739 Hyperglycemia, unspecified: Secondary | ICD-10-CM

## 2015-08-19 DIAGNOSIS — Z0000 Encounter for general adult medical examination without abnormal findings: Secondary | ICD-10-CM

## 2015-08-19 DIAGNOSIS — I10 Essential (primary) hypertension: Secondary | ICD-10-CM

## 2015-08-19 NOTE — Progress Notes (Signed)
   Subjective:    Patient ID: Rebecca Sharp, female    DOB: 1971/07/08, 44 y.o.   MRN: [id number]  HPI   1.  Hypertension/obesity:  Has increased physical activity, eating healthier.  Decreased meat intake and increased more veggies and fruits.  States dancing in the house now.  Lost 4 lbs since visit last month.  No problems with Lisinopril/HCTZ combo.    2.  Mild Hyperglycemia:  Discussed her BMP recently was fine except for mildly elevated blood sugar.  Pt. States she was fasting with that bloodwork.   Had a 20 minute episode of generalized weakness, headache when returning home from work 3 nights ago.  Had eaten 6 hours earlier and felt really hungry.  Drank some soda and put a wet rag on back of neck and gradually felt better.  No associated nausea or vomiting.    Review of Systems     Objective:   Physical Exam  NAD Chest:  CTA CV:  RRR without murmur or rub, radial pulses normal and equal LE:  No ankle edema        Assessment & Plan:  1.  Hypertension:  Better control  On LIsinopril/HCTZ.  Reminded patient to notify office if she started relationship where she might become pregnant.    2.  Hyperglycemia:  Hemoglobin A1C today. Episode of weakness may have been low blood sugar.  Encouraged pt. To have healthy snacks to eat at work to eat when she goes several hours without calories--apple with small amt PB for instance.  3.  Obesity:  Encouraged continued weight loss with lifestyle changes.  Applauded 4 lb weight loss.

## 2015-08-19 NOTE — Patient Instructions (Signed)
Sigue haciendo actividades fisicas y comer saludable para [employer]ar peso

## 2015-08-20 LAB — HEMOGLOBIN A1C
ESTIMATED AVERAGE GLUCOSE: 128 mg/dL
HEMOGLOBIN A1C: 6.1 % — AB (ref 4.8–5.6)

## 2015-09-01 NOTE — Progress Notes (Signed)
Quick Note: ° °09/01/15 left voicemail to return call °______ °

## 2015-09-02 NOTE — Progress Notes (Signed)
Quick Note:  09/02/15 Patient returned call. Results were given. ______

## 2015-09-06 ENCOUNTER — Other Ambulatory Visit: Payer: Self-pay | Admitting: Internal Medicine

## 2015-09-06 DIAGNOSIS — Z1231 Encounter for screening mammogram for malignant neoplasm of breast: Secondary | ICD-10-CM

## 2015-09-30 ENCOUNTER — Ambulatory Visit
Admission: RE | Admit: 2015-09-30 | Discharge: 2015-09-30 | Disposition: A | Discharge status: Home / Self Care | Payer: No Typology Code available for payment source | Source: Ambulatory Visit | Attending: Internal Medicine | Admitting: Internal Medicine

## 2015-09-30 DIAGNOSIS — Z1231 Encounter for screening mammogram for malignant neoplasm of breast: Secondary | ICD-10-CM

## 2015-11-18 ENCOUNTER — Ambulatory Visit (INDEPENDENT_AMBULATORY_CARE_PROVIDER_SITE_OTHER): Payer: Self-pay | Admitting: Internal Medicine

## 2015-11-18 VITALS — BP 124/78 | HR 76 | Ht 59.5 in | Wt 187.0 lb

## 2015-11-18 DIAGNOSIS — M545 Low back pain: Secondary | ICD-10-CM

## 2015-11-18 DIAGNOSIS — I10 Essential (primary) hypertension: Secondary | ICD-10-CM

## 2015-11-18 DIAGNOSIS — R7309 Other abnormal glucose: Secondary | ICD-10-CM

## 2015-11-18 LAB — GLUCOSE, POCT (MANUAL RESULT ENTRY): POC GLUCOSE: 88 mg/dL (ref 70–99)

## 2015-11-18 NOTE — Progress Notes (Signed)
   Subjective:    Patient ID: Rebecca Sharp, female    DOB: [birth year], 45 y.o.   MRN: [id number]  HPI  1.  Prediabetes:  A1C after last visit in November at 6.1%.  Discussed diabetes.  Has lost 5 lbs since November.  Long discussion about diet and physical activity again.  Wants to get an exercise machine of some sort.  2.  Hypertension:  Tolerating Lisinopril/Hctz well.  No relationship currently.  Rediscussed need to change medication if contemplating a sexual relationship, especially if not using condoms regularly.  3.  Left low back pain to lateral left thigh:  States had this with pregnancy in 2013.  Has this maybe once or twice a month and lasts 1-2 weeks.  Only has the pain when sitting and walking.  Better if lying down when pain comes on.  Cannot relate the pain to any position or activity.   Pain can be quite severe.  Describes a cramp or colicky type pain.   Last time had this was about 2 months ago.  Lasted only 2 days. Has never tried Tylenol or Ibuprofen for the pain (or other med)   Current outpatient prescriptions:  .  lisinopril-hydrochlorothiazide (PRINZIDE,ZESTORETIC) 10-12.5 MG tablet, Take 1 tablet by mouth daily., Disp: 90 tablet, Rfl: 3 .  Multiple Vitamin (MULTIVITAMIN) tablet, Take 1 tablet by mouth daily., Disp: , Rfl:    Allergies: NKDA   Review of Systems     Objective:   Physical Exam NAD HEENT:  PERRL, EOMI, Neck:  Supple, No adenopathy Chest:  CTA CV:  RRR with normal S1 and S2, No S3, S4 or murmur appreciated, Radial pulses normal and equal Abd:  S, NT, No HSM or masses, +BS throughout.  No flank tenderness Back:  NT over L/S spinous processes, NT over paraspinous musculature.  Full forward flexion and extension. Neuro:  LE with 5/5 strength and 2+/4 DTRs throughout. Normal gait.       Assessment & Plan:  1.  Prediabetes:  Congratulated on weight loss.  Discussed importance on continued daily physical activity and healthy eating habits  for more weight loss.  2.  Essential Hypertension:  Controlled.    3.  History of left low back pain with extension into left thigh.  No findings today.  To return when symptomatic.

## 2015-11-18 NOTE — Patient Instructions (Addendum)
Tome un vaso de agua antes de cada comida Tome un minimo de 6 a 8 vasos de agua diarios Coma tres veces al dia Coma una proteina y Neomia Dear grasa saludable con comida.  (huevos, pescado, pollo, pavo, y limite carnes rojas Coma 5 porciones diarias de legumbres.  Mezcle los colores Coma 2 porciones diarias de frutas con cascara cuando sea comestible Use platos pequeos Suelte su tenedor o cuchara despues de cada mordida hata que se mastique y se trague Come en la mesa con amigos o familiares por lo menos una vez al dia Apague la televisin y aparatos electrnicos durante la comida  Su objetivo debe ser perder Neomia Dear libra por semana  Habla si tiene dolor de espalda

## 2015-11-19 ENCOUNTER — Ambulatory Visit: Payer: No Typology Code available for payment source | Admitting: Internal Medicine

## 2015-12-13 ENCOUNTER — Encounter: Payer: Self-pay | Admitting: Internal Medicine

## 2016-02-17 ENCOUNTER — Encounter: Payer: Self-pay | Admitting: Internal Medicine

## 2016-02-17 ENCOUNTER — Ambulatory Visit (INDEPENDENT_AMBULATORY_CARE_PROVIDER_SITE_OTHER): Payer: Self-pay | Admitting: Internal Medicine

## 2016-02-17 VITALS — BP 130/80 | HR 70 | Resp 17 | Ht 59.0 in | Wt 186.5 lb

## 2016-02-17 DIAGNOSIS — R7303 Prediabetes: Secondary | ICD-10-CM | POA: Insufficient documentation

## 2016-02-17 DIAGNOSIS — I10 Essential (primary) hypertension: Secondary | ICD-10-CM

## 2016-02-17 NOTE — Progress Notes (Signed)
   Subjective:    Patient ID: Rebecca Sharp, female    DOB: [birth year], 45 y.o.   MRN: [id number]  HPI   1.  Prediabetes:  Has lost about 1/2 lb since February.  Blood glucose this morning at 11:35 was 91. She states she will lose more weight over the summer as she will be more physically active. Not eating red meat much at all any longer. Drinking water before and during meals. Does not eat much in way of vegetables--does not like.     2.  Essential Hypertension:  Taking Lisinopril/HCTZ daily.  No problems.     Current outpatient prescriptions:  .  lisinopril-hydrochlorothiazide (PRINZIDE,ZESTORETIC) 10-12.5 MG tablet, Take 1 tablet by mouth daily., Disp: 90 tablet, Rfl: 3 .  Multiple Vitamin (MULTIVITAMIN) tablet, Take 1 tablet by mouth daily. Reported on 02/17/2016, Disp: , Rfl:    No Known Allergies    Review of Systems     Objective:   Physical Exam HEENT:  PERRL, EOMI Lungs:  CTA CV:  RRR without murmur or rub, radial pulses normal and equal LE:  No edema       Assessment & Plan:  1.  Prediabetes with obesity:  Urged patient to work on diet and add more veggies and fruit with goal at 1 lb per week weight loss.  Dietary information given again.  Continue 3 month follow up  2.  Essential Hypertension:  Fair control with Lisinopril/HCTZ.  To continue to work on lifestyle changes for weight loss and to increase Physical activity.

## 2016-02-17 NOTE — Patient Instructions (Signed)

## 2016-02-19 LAB — GLUCOSE, POCT (MANUAL RESULT ENTRY): POC GLUCOSE: 91 mg/dL (ref 70–99)

## 2016-05-18 ENCOUNTER — Ambulatory Visit (INDEPENDENT_AMBULATORY_CARE_PROVIDER_SITE_OTHER): Payer: Self-pay | Admitting: Internal Medicine

## 2016-05-18 ENCOUNTER — Encounter: Payer: Self-pay | Admitting: Internal Medicine

## 2016-05-18 VITALS — BP 122/70 | HR 74 | Resp 16 | Ht 59.0 in | Wt 191.0 lb

## 2016-05-18 DIAGNOSIS — Z79899 Other long term (current) drug therapy: Secondary | ICD-10-CM

## 2016-05-18 DIAGNOSIS — R7303 Prediabetes: Secondary | ICD-10-CM

## 2016-05-18 DIAGNOSIS — I10 Essential (primary) hypertension: Secondary | ICD-10-CM

## 2016-05-18 LAB — GLUCOSE, POCT (MANUAL RESULT ENTRY): POC Glucose: 120 mg/dl — AB (ref 70–99)

## 2016-05-18 MED ORDER — LISINOPRIL-HYDROCHLOROTHIAZIDE 10-12.5 MG PO TABS: ORAL_TABLET | ORAL | 11 refills | Status: DC

## 2016-05-18 NOTE — Progress Notes (Signed)
   Subjective:    Patient ID: Rebecca Sharp, female    DOB: September 09, 1971, 45 y.o.   MRN: [id number]  HPI   1.  Essential Hypertension:  Taking meds and no problems  2.   Prediabetes:  Weight up 5 lbs from May.  Not eating vegetables much. Many excuses as to why not eating vegetables. Has not been called about diabetic prevention class.  She is interested. Lab Results  Component Value Date   POCGLU 120 (A) 05/18/2016   Current Meds  Medication Sig  . lisinopril-hydrochlorothiazide (PRINZIDE,ZESTORETIC) 10-12.5 MG tablet 1 tab by mouth daily  . Multiple Vitamin (MULTIVITAMIN) tablet Take 1 tablet by mouth daily. Reported on 02/17/2016  . [DISCONTINUED] lisinopril-hydrochlorothiazide (PRINZIDE,ZESTORETIC) 10-12.5 MG tablet Take 1 tablet by mouth daily.   No Known Allergies      Review of Systems     Objective:   Physical Exam  Obese Lungs:  CTA CV:  RRR without murmur or rub, radial pulses normal and equal.  LE without edema Abd:  S, NT, No HSM or mass.        Assessment & Plan:  1.  Essential Hypertension:  Controlled.   CMP  2.  Prediabetes:  Planning to have diabetic nutrition next door with Susie Piper in next week.  Schedule with session.  Listed for diabetes prevention course. Check A1C  Follow up in 4 months--to work on physical activity and increasing veggies in diet.

## 2016-05-19 LAB — COMPREHENSIVE METABOLIC PANEL
A/G RATIO: 1.2 (ref 1.2–2.2)
ALT: 19 IU/L (ref 0–32)
AST: 17 IU/L (ref 0–40)
Albumin: 4.1 g/dL (ref 3.5–5.5)
Alkaline Phosphatase: 63 IU/L (ref 39–117)
BILIRUBIN TOTAL: 0.3 mg/dL (ref 0.0–1.2)
BUN / CREAT RATIO: 14 (ref 9–23)
BUN: 9 mg/dL (ref 6–24)
CHLORIDE: 103 mmol/L (ref 96–106)
CO2: 23 mmol/L (ref 18–29)
Calcium: 8.9 mg/dL (ref 8.7–10.2)
Creatinine, Ser: 0.65 mg/dL (ref 0.57–1.00)
GFR calc non Af Amer: 109 mL/min/{1.73_m2} (ref >59)
GFR, EST AFRICAN AMERICAN: 126 mL/min/{1.73_m2} (ref >59)
Globulin, Total: 3.3 g/dL (ref 1.5–4.5)
Glucose: 99 mg/dL (ref 65–99)
POTASSIUM: 4.4 mmol/L (ref 3.5–5.2)
SODIUM: 139 mmol/L (ref 134–144)
TOTAL PROTEIN: 7.4 g/dL (ref 6.0–8.5)

## 2016-05-19 LAB — HGB A1C W/O EAG: Hgb A1c MFr Bld: 5.6 % (ref 4.8–5.6)

## 2016-09-21 ENCOUNTER — Ambulatory Visit (INDEPENDENT_AMBULATORY_CARE_PROVIDER_SITE_OTHER): Payer: Self-pay | Admitting: Internal Medicine

## 2016-09-21 ENCOUNTER — Encounter: Payer: Self-pay | Admitting: Internal Medicine

## 2016-09-21 VITALS — BP 128/84 | HR 76 | Resp 12 | Ht 59.0 in | Wt 190.0 lb

## 2016-09-21 DIAGNOSIS — Z1239 Encounter for other screening for malignant neoplasm of breast: Secondary | ICD-10-CM

## 2016-09-21 DIAGNOSIS — R7303 Prediabetes: Secondary | ICD-10-CM

## 2016-09-21 DIAGNOSIS — I10 Essential (primary) hypertension: Secondary | ICD-10-CM

## 2016-09-21 DIAGNOSIS — Z1231 Encounter for screening mammogram for malignant neoplasm of breast: Secondary | ICD-10-CM

## 2016-09-21 LAB — GLUCOSE, POCT (MANUAL RESULT ENTRY): POC GLUCOSE: 106 mg/dL — AB (ref 70–99)

## 2016-09-21 NOTE — Progress Notes (Signed)
   Subjective:    Patient ID: Rebecca Sharp, female    DOB: 1971/07/23, 45 y.o.   MRN: [id number]  HPI   1.  Obesity with prediabetes:  A1C last visit was down into high normal range.  She has decreased weight by one lb during holiday season.   Is eating more servings daily--but then states only 2 portions.   2 servings of fruit daily. Has a treadmill she uses 30-40 minutes daily  [facility]  Has not had mammogram, but due this month. [facility]  2.  Essential Hypertension:  Relates she stopped her Lisinopril/HCTZ 6 months ago, something she had not previously shared.   Current Meds  Medication Sig  . Multiple Vitamin (MULTIVITAMIN) tablet Take 1 tablet by mouth daily. Reported on 02/17/2016   No Known Allergies      Review of Systems     Objective:   Physical Exam  Happy, NAD Obese Lungs:  CTA CV:  RRR without murmur or rub, radial and DP pulses normal and equal LE:  No edema        Assessment & Plan:  1.  Obesity/Prediabetes:  A1C in normal range in August.  Recheck again in April.  Goal to remain at current weight or lose any weight during holidays, but to start with goal of 1 lb loss per week starting in January. Recheck in April  2.  Essential Hypertension: not taking Lisinopril/HCTZ--hold medication for now as bp fine.  3. HM:  Refuses influenza, encouraged her to have all her blood work done here and not PHD as I cannot see what they do. Needs mammogram scholarship To consider having annual exam here save for the every third year she needs a pap as [facility]

## 2016-11-22 ENCOUNTER — Ambulatory Visit
Admission: RE | Admit: 2016-11-22 | Discharge: 2016-11-22 | Disposition: A | Discharge status: Home / Self Care | Payer: No Typology Code available for payment source | Source: Ambulatory Visit | Attending: Internal Medicine | Admitting: Internal Medicine

## 2016-11-22 DIAGNOSIS — Z1239 Encounter for other screening for malignant neoplasm of breast: Secondary | ICD-10-CM

## 2017-01-25 ENCOUNTER — Ambulatory Visit (INDEPENDENT_AMBULATORY_CARE_PROVIDER_SITE_OTHER): Payer: Self-pay | Admitting: Internal Medicine

## 2017-01-25 ENCOUNTER — Encounter: Payer: Self-pay | Admitting: Internal Medicine

## 2017-01-25 VITALS — BP 124/86 | HR 62 | Resp 12 | Ht 58.5 in | Wt 194.0 lb

## 2017-01-25 DIAGNOSIS — E6609 Other obesity due to excess calories: Secondary | ICD-10-CM | POA: Insufficient documentation

## 2017-01-25 DIAGNOSIS — E66812 Obesity, class 2: Secondary | ICD-10-CM | POA: Insufficient documentation

## 2017-01-25 DIAGNOSIS — IMO0001 Reserved for inherently not codable concepts without codable children: Secondary | ICD-10-CM

## 2017-01-25 DIAGNOSIS — R7303 Prediabetes: Secondary | ICD-10-CM

## 2017-01-25 DIAGNOSIS — E669 Obesity, unspecified: Secondary | ICD-10-CM

## 2017-01-25 DIAGNOSIS — Z6839 Body mass index (BMI) 39.0-39.9, adult: Secondary | ICD-10-CM

## 2017-01-25 DIAGNOSIS — M6740 Ganglion, unspecified site: Secondary | ICD-10-CM

## 2017-01-25 DIAGNOSIS — I10 Essential (primary) hypertension: Secondary | ICD-10-CM

## 2017-01-25 HISTORY — DX: Obesity, unspecified: E66.9

## 2017-01-25 HISTORY — DX: Ganglion, unspecified site: M67.40

## 2017-01-25 NOTE — Progress Notes (Signed)
   Subjective:    Patient ID: Rebecca Sharp, female    DOB: 05-29-1971, 46 y.o.   MRN: [id number]  HPI   1.  History of elevated BP for which she took Lisinopril/HCTZ.  Stopped 3 months before last visit in December and BP has remained in normal range.    2.  Obesity:  Has not been physically active as before. Describes a fairly healthy diet, but only eating 2 vegetables daily.    3.  Swelling on left lateral ankle since January 2018. Noted when working at [employer] when first appeared.  Not enlarging, no redness or pain associated.  4.  Problem for which she would like to speak with Samul Dada, LCSW     No outpatient prescriptions have been marked as taking for the 01/25/17 encounter (Office Visit) with Julieanne Manson, MD.   No Known Allergies   Review of Systems     Objective:   Physical Exam  NAD Lungs:  CTA CV:  RRR without murmur or rub, radial and DP pulses normal and equal. LE:  1.5 cm mildly compressible fluid filled cyst beneath skin, lateral left ankle.  No erythema, nontender.  Full ROM of left ankle       Assessment & Plan:  1.  Obesity:  To get back to work on lifestyle changes.  2.  Prediabetes:  As above.  Follow up in 3 months with fasting labs.  3.  Ganglion cyst, left lateral ankle:  She is not interested in surgery.  Not causing problems at this time.  4.  History of elevated bp:  No concern at this time.  5.  "Problem":  To speak with LCSW, Samul Dada --on her way for warm hand off.

## 2017-01-25 NOTE — Patient Instructions (Signed)

## 2017-01-30 ENCOUNTER — Other Ambulatory Visit: Payer: Self-pay | Admitting: Licensed Clinical Social Worker

## 2017-02-02 ENCOUNTER — Ambulatory Visit (INDEPENDENT_AMBULATORY_CARE_PROVIDER_SITE_OTHER): Payer: Self-pay | Admitting: Licensed Clinical Social Worker

## 2017-02-02 DIAGNOSIS — F439 Reaction to severe stress, unspecified: Secondary | ICD-10-CM
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
## 2017-02-13 ENCOUNTER — Ambulatory Visit (INDEPENDENT_AMBULATORY_CARE_PROVIDER_SITE_OTHER): Payer: Self-pay | Admitting: Licensed Clinical Social Worker

## 2017-02-13 DIAGNOSIS — F439 Reaction to severe stress, unspecified: Secondary | ICD-10-CM
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
## 2017-05-03 ENCOUNTER — Ambulatory Visit (INDEPENDENT_AMBULATORY_CARE_PROVIDER_SITE_OTHER): Payer: Self-pay | Admitting: Internal Medicine

## 2017-05-03 ENCOUNTER — Encounter: Payer: Self-pay | Admitting: Internal Medicine

## 2017-05-03 VITALS — BP 128/82 | HR 70 | Resp 12 | Ht 59.5 in | Wt 197.0 lb

## 2017-05-03 DIAGNOSIS — Z658 Other specified problems related to psychosocial circumstances: Secondary | ICD-10-CM

## 2017-05-03 DIAGNOSIS — Z6839 Body mass index (BMI) 39.0-39.9, adult: Secondary | ICD-10-CM

## 2017-05-03 DIAGNOSIS — R7303 Prediabetes: Secondary | ICD-10-CM
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
## 2017-05-04 LAB — HGB A1C W/O EAG: Hgb A1c MFr Bld: 5.6 % (ref 4.8–5.6)

## 2017-05-11 ENCOUNTER — Ambulatory Visit (INDEPENDENT_AMBULATORY_CARE_PROVIDER_SITE_OTHER): Payer: Self-pay | Admitting: Licensed Clinical Social Worker

## 2017-05-11 DIAGNOSIS — Z639 Problem related to primary support group, unspecified: Secondary | ICD-10-CM

## 2017-05-11 DIAGNOSIS — Z630 Problems in relationship with spouse or partner: Secondary | ICD-10-CM
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
## 2017-06-14 ENCOUNTER — Other Ambulatory Visit: Payer: Self-pay | Admitting: Licensed Clinical Social Worker

## 2017-06-19 ENCOUNTER — Other Ambulatory Visit: Payer: Self-pay | Admitting: Licensed Clinical Social Worker

## 2017-08-02 ENCOUNTER — Ambulatory Visit (INDEPENDENT_AMBULATORY_CARE_PROVIDER_SITE_OTHER): Payer: Self-pay | Admitting: Internal Medicine

## 2017-08-02 ENCOUNTER — Encounter: Payer: Self-pay | Admitting: Internal Medicine

## 2017-08-02 VITALS — BP 134/78 | HR 72 | Resp 12 | Ht 59.5 in | Wt 200.0 lb

## 2017-08-02 DIAGNOSIS — R7303 Prediabetes: Secondary | ICD-10-CM

## 2017-08-02 DIAGNOSIS — Z6839 Body mass index (BMI) 39.0-39.9, adult: Secondary | ICD-10-CM

## 2017-08-02 DIAGNOSIS — Z638 Other specified problems related to primary support group: Secondary | ICD-10-CM

## 2017-08-02 NOTE — Progress Notes (Signed)
   Subjective:    Patient ID: Rebecca Sharp, female    DOB: 06-09-1971, 46 y.o.   MRN: [id number]  HPI   1.  Family concerns:  Spoke with Karie FetchJennifer Pena once after our last visit.  Was unable to follow up subsequently as her phone was out.  Is not taking care of herself and has gained a bit more weight. Has a court date regarding her son, who is now 45 yo.    2.  Elevated BP:  Okay after rests and rechecked.   Review of Systems     Objective:   Physical Exam  Lungs:  CTA CV:  RRR without murmur or rub, radial pulses normal and equal      Assessment & Plan:  1.  Elevated BP:  Resolved to normal range with rest.  2.  Prediabetes/weight concerns:  Not able to focus on her health currently due to the family stress regarding her 45 yo son.    3.  Family stress:  Warm hand off again to Samul DadaN. Knight, LCSW--to schedule follow up

## 2017-08-14 ENCOUNTER — Ambulatory Visit (INDEPENDENT_AMBULATORY_CARE_PROVIDER_SITE_OTHER): Payer: Self-pay | Admitting: Licensed Clinical Social Worker

## 2017-08-14 DIAGNOSIS — Z638 Other specified problems related to primary support group: Secondary | ICD-10-CM
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
## 2017-08-28 ENCOUNTER — Ambulatory Visit: Payer: Self-pay | Admitting: Licensed Clinical Social Worker

## 2017-08-28 DIAGNOSIS — Z638 Other specified problems related to primary support group: Secondary | ICD-10-CM
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
## 2017-11-01 ENCOUNTER — Ambulatory Visit: Payer: Self-pay | Admitting: Internal Medicine

## 2017-11-02 ENCOUNTER — Ambulatory Visit: Payer: Self-pay | Admitting: Internal Medicine

## 2017-12-04 ENCOUNTER — Ambulatory Visit: Payer: Self-pay | Admitting: Internal Medicine

## 2018-01-02 ENCOUNTER — Ambulatory Visit: Payer: Self-pay | Admitting: Internal Medicine

## 2018-01-02 ENCOUNTER — Encounter: Payer: Self-pay | Admitting: Internal Medicine

## 2018-01-02 VITALS — BP 130/88 | HR 88 | Resp 12 | Ht 59.5 in | Wt 207.0 lb

## 2018-01-02 DIAGNOSIS — F439 Reaction to severe stress, unspecified: Secondary | ICD-10-CM

## 2018-01-02 DIAGNOSIS — Z1239 Encounter for other screening for malignant neoplasm of breast: Secondary | ICD-10-CM

## 2018-01-02 DIAGNOSIS — Z1231 Encounter for screening mammogram for malignant neoplasm of breast: Secondary | ICD-10-CM

## 2018-01-02 DIAGNOSIS — R7303 Prediabetes: Secondary | ICD-10-CM

## 2018-01-02 DIAGNOSIS — I10 Essential (primary) hypertension: Secondary | ICD-10-CM

## 2018-01-02 DIAGNOSIS — Z6839 Body mass index (BMI) 39.0-39.9, adult: Secondary | ICD-10-CM

## 2018-01-02 DIAGNOSIS — L818 Other specified disorders of pigmentation: Secondary | ICD-10-CM

## 2018-01-02 NOTE — Patient Instructions (Signed)
Latex:  NO!!!!!!!  Latex Free:  probablemente esta bien

## 2018-01-02 NOTE — Progress Notes (Signed)
   Subjective:    Patient ID: Rebecca Sharp, female    DOB: 04/12/1971, 47 y.o.   MRN: [id number]  HPI  1.  Stress:  Still concerns  with son, who is living with her brother.  He has probation until November.  Her relationship with her partner is still quite strained.  He does not want their daughter to get counseling or treatment.   Partner is easily angered so she does not feel she can go against his wishes. She does not feel he would harm her unless perhaps she left him and took their daughter.  He has told her she can leave, but she cannot take their daughter.   They went to counseling together once, but he refused to return, though it was suggested he should. She is willing to get back in counseling with Nilda SimmerNatosha Knight, LCSW.  She states she hasn't been in as it is too cold.    2.  Prediabetes/obesity:  Has not been walking as she feels the temp has been too cold outside.    3.  Right upper chest and right dorsal hand with losesof pigment.  Does state when wears gloves, her hand gets itchy, she scratches and also her right chest.  She is also losing pigment under her breasts bilaterally. She is not sure if the gloves are latex or not.  Cannot get a good description of the gloves from her..  Current Meds  Medication Sig  . Multiple Vitamin (MULTIVITAMIN) tablet Take 1 tablet by mouth daily. Reported on 02/17/2016   No Known Allergies  Review of Systems     Objective:   Physical Exam NAD Lungs:  CTA CV:  RRR without murmur or rub, radial and DP pulses normal and equal. Abd:  S, NT, No HSM or mass, + BS Skin:  Areas of differing degrees of hypopigmentation on right dorsal hand and right upper chest as well as under both breast that are not well demarcated.    LE:  No edema       Assessment & Plan:  1.  Family dysfunction/difficulties:  Have asked patient to restart counseling with Samul DadaN. Knight, LCSW.  She seems amenable today. Suspect much of her lack of self care is due  to this stressor.  2.  Obesity and prediabetes:  Weight back up over last few months.  Encouraged to always have a plan B for physical activity when she feels weather is inclement or better yet, to wear layers and get outside regardless.  3.  Essential Hypertension:  Encouraged working on #2 to prevent return to more elevated BP.  Currently not taking medication as just in high normal range.  4.  Postinflammatory hypopigmentation:  Encouraged to utilize other gloves (needs to find out what her gloves are made of) Also to be certain her skin is not coming in contact with latex from bra--should be cloth covered to avoid inflammation of inframammary area as well.  5.  HM:  Schedule  mammogram

## 2018-02-06 ENCOUNTER — Telehealth: Payer: Self-pay | Admitting: Licensed Clinical Social Worker

## 2018-02-06 NOTE — Telephone Encounter (Signed)
LCSW left VM with patient.

## 2018-02-19 ENCOUNTER — Ambulatory Visit
Admission: RE | Admit: 2018-02-19 | Discharge: 2018-02-19 | Disposition: A | Discharge status: Home / Self Care | Payer: No Typology Code available for payment source | Source: Ambulatory Visit | Attending: Internal Medicine | Admitting: Internal Medicine

## 2018-02-19 DIAGNOSIS — Z1239 Encounter for other screening for malignant neoplasm of breast: Secondary | ICD-10-CM

## 2018-04-03 ENCOUNTER — Encounter: Payer: Self-pay | Admitting: Nurse Practitioner

## 2018-04-03 ENCOUNTER — Ambulatory Visit: Payer: Self-pay | Attending: Nurse Practitioner | Admitting: Nurse Practitioner

## 2018-04-03 VITALS — BP 132/84 | HR 88 | Temp 98.7°F | Ht 59.0 in | Wt 207.2 lb

## 2018-04-03 DIAGNOSIS — L80 Vitiligo: Secondary | ICD-10-CM | POA: Insufficient documentation

## 2018-04-03 DIAGNOSIS — Z87898 Personal history of other specified conditions: Secondary | ICD-10-CM

## 2018-04-03 DIAGNOSIS — I10 Essential (primary) hypertension: Secondary | ICD-10-CM | POA: Insufficient documentation

## 2018-04-03 DIAGNOSIS — R7303 Prediabetes: Secondary | ICD-10-CM | POA: Insufficient documentation

## 2018-04-03 DIAGNOSIS — Z87442 Personal history of urinary calculi: Secondary | ICD-10-CM | POA: Insufficient documentation

## 2018-04-03 DIAGNOSIS — Z8249 Family history of ischemic heart disease and other diseases of the circulatory system: Secondary | ICD-10-CM | POA: Insufficient documentation

## 2018-04-03 DIAGNOSIS — E669 Obesity, unspecified: Secondary | ICD-10-CM | POA: Insufficient documentation

## 2018-04-03 MED ORDER — BETAMETHASONE DIPROPIONATE 0.05 % EX LOTN: TOPICAL_LOTION | Freq: Two times a day (BID) | CUTANEOUS | 0 refills | Status: DC

## 2018-04-03 MED FILL — BETAMETHASONE DP 0.05% LOT: 0.05 | 30 days supply | Qty: 60 | Fill #0

## 2018-04-03 NOTE — Progress Notes (Signed)
Assessment & Plan:  Rebecca Sharp was seen today for new patient (initial visit).  Diagnoses and all orders for this visit:  Vitiligo -     betamethasone dipropionate 0.05 % lotion; Apply topically 2 (two) times daily. Follow up 6 weeks for improvement  Essential hypertension -     CBC -     CMP14+EGFR -     Lipid panel Continue all antihypertensives as prescribed.  Remember to bring in your blood pressure log with you for your follow up appointment.  DASH/Mediterranean Diets are healthier choices for HTN.    History of prediabetes -     Hemoglobin A1c Continue blood sugar control as discussed in office today, low carbohydrate diet, and regular physical exercise as tolerated, 150 minutes per week (30 min each day, 5 days per week, or 50 min 3 days per week).    Patient has been counseled on age-appropriate routine health concerns for screening and prevention. These are reviewed and up-to-date. Referrals have been placed accordingly. Immunizations are up-to-date or declined.    Subjective:   Chief Complaint  Patient presents with  . New Patient (Initial Visit)    Pt. have spots on her hands and stated it itches for a month.    HPI Rebecca Sharp 47 y.o. female presents to office today to establish care. VRI was used to communicate directly with patient for the entire encounter including providing detailed patient instructions. She has concerns of an ongoing rash on her bilateral hands R>L, forearms and under right breast. This has been ongoing for several months. She has a history of HTN and was taking lisinopril several years ago however she reports her blood pressure is controlled with ginger tea. Denies chest pain, shortness of breath, palpitations, lightheadedness, dizziness, headaches or BLE edema. She has a history of prediabetes with highest A1c 6.1. She has never taken any oral diabetic agents. Denies any hypo or hyperglycemic symptoms.  BP Readings from Last 3 Encounters:   04/03/18 132/84  01/02/18 130/88  08/02/17 134/78   Lab Results  Component Value Date   HGBA1C 6.1 08/19/2015    Rash Patient presents with a rash.  Symptoms have been present for several months.  The rash is located:  SEE HPI. Since then it has not spread to other body parts.  Parent has not tried any topical or oral medications for initial treatment. Pruriitis is mild. Patient does not have fever. Recent illnesses: none. Sick contacts: none known     Review of Systems  Constitutional: Negative for fever, malaise/fatigue and weight loss.  HENT: Negative.  Negative for nosebleeds.   Eyes: Negative.  Negative for blurred vision, double vision and photophobia.  Respiratory: Negative.  Negative for cough and shortness of breath.   Cardiovascular: Negative.  Negative for chest pain, palpitations and leg swelling.  Gastrointestinal: Negative.  Negative for heartburn, nausea and vomiting.  Musculoskeletal: Negative.  Negative for myalgias.  Skin: Positive for itching and rash.  Neurological: Negative.  Negative for dizziness, focal weakness, seizures and headaches.  Psychiatric/Behavioral: Negative.  Negative for suicidal ideas.    Past Medical History:  Diagnosis Date  . AMA (advanced maternal age) multigravida 76+   . Anemia   . Ganglion cyst 01/25/2017   Left ankle  . Kidney stones 2013   With pregnancy   . Language barrier   . Obese   . Obesity 01/25/2017  . Prediabetes 02/17/2016  . Pregnancy induced hypertension    at end of last 2 pregnancies  Past Surgical History:  Procedure Laterality Date  . NO PAST SURGERIES      Family History  Problem Relation Age of Onset  . Diabetes Father   . Hypertension Father     Social History Reviewed with no changes to be made today.   Outpatient Medications Prior to Visit  Medication Sig Dispense Refill  . Multiple Vitamin (MULTIVITAMIN) tablet Take 1 tablet by mouth daily. Reported on 02/17/2016     No  facility-administered medications prior to visit.     No Known Allergies     Objective:    BP 132/84 (BP Location: Left Arm, Patient Position: Sitting, Cuff Size: Large)   Pulse 88   Temp 98.7 F (37.1 C) (Oral)   Ht 4' 11"  (1.499 m)   Wt 207 lb 3.2 oz (94 kg)   SpO2 98%   BMI 41.85 kg/m  Wt Readings from Last 3 Encounters:  04/03/18 207 lb 3.2 oz (94 kg)  01/02/18 207 lb (93.9 kg)  08/02/17 200 lb (90.7 kg)    Physical Exam  Constitutional: She is oriented to person, place, and time. She appears well-developed and well-nourished. She is cooperative.  HENT:  Head: Normocephalic and atraumatic.  Eyes: EOM are normal.  Neck: Normal range of motion.  Cardiovascular: Normal rate, regular rhythm and normal heart sounds. Exam reveals no gallop and no friction rub.  No murmur heard. Pulmonary/Chest: Effort normal and breath sounds normal. No tachypnea. No respiratory distress. She has no decreased breath sounds. She has no wheezes. She has no rhonchi. She has no rales. She exhibits no tenderness.  Abdominal: Soft. Bowel sounds are normal.  Musculoskeletal: Normal range of motion. She exhibits no edema.  Neurological: She is alert and oriented to person, place, and time. Coordination normal.  Skin: Skin is warm, dry and intact. Rash noted. Rash is macular (and hypopigmented areas on bilateral hands and forearms as well as under the right breast. ).     Psychiatric: She has a normal mood and affect. Her behavior is normal. Judgment and thought content normal.  Nursing note and vitals reviewed.        Patient has been counseled extensively about nutrition and exercise as well as the importance of adherence with medications and regular follow-up. The patient was given clear instructions to go to ER or return to medical center if symptoms don't improve, worsen or new problems develop. The patient verbalized understanding.   Follow-up: Return in about 6 weeks (around 05/15/2018).    Gildardo Pounds, FNP-BC [facility] [place], [place]   04/03/2018, 2:48 PM

## 2018-04-04 ENCOUNTER — Ambulatory Visit: Payer: Self-pay | Admitting: Internal Medicine

## 2018-04-04 LAB — LIPID PANEL
CHOLESTEROL TOTAL: 155 mg/dL (ref 100–199)
Chol/HDL Ratio: 3.8 ratio (ref 0.0–4.4)
HDL: 41 mg/dL (ref >39)
LDL Calculated: 77 mg/dL (ref 0–99)
TRIGLYCERIDES: 186 mg/dL — AB (ref 0–149)
VLDL Cholesterol Cal: 37 mg/dL (ref 5–40)

## 2018-04-04 LAB — CMP14+EGFR
A/G RATIO: 1.3 (ref 1.2–2.2)
ALK PHOS: 66 IU/L (ref 39–117)
ALT: 46 IU/L — ABNORMAL HIGH (ref 0–32)
AST: 60 IU/L — AB (ref 0–40)
Albumin: 4.2 g/dL (ref 3.5–5.5)
BILIRUBIN TOTAL: 0.2 mg/dL (ref 0.0–1.2)
BUN/Creatinine Ratio: 21 (ref 9–23)
BUN: 16 mg/dL (ref 6–24)
CHLORIDE: 101 mmol/L (ref 96–106)
CO2: 21 mmol/L (ref 20–29)
Calcium: 9.4 mg/dL (ref 8.7–10.2)
Creatinine, Ser: 0.75 mg/dL (ref 0.57–1.00)
GFR calc Af Amer: 111 mL/min/{1.73_m2} (ref >59)
GFR calc non Af Amer: 97 mL/min/{1.73_m2} (ref >59)
GLOBULIN, TOTAL: 3.3 g/dL (ref 1.5–4.5)
Glucose: 89 mg/dL (ref 65–99)
POTASSIUM: 4.3 mmol/L (ref 3.5–5.2)
SODIUM: 136 mmol/L (ref 134–144)
Total Protein: 7.5 g/dL (ref 6.0–8.5)

## 2018-04-04 LAB — CBC
HEMATOCRIT: 34 % (ref 34.0–46.6)
Hemoglobin: 10.9 g/dL — ABNORMAL LOW (ref 11.1–15.9)
MCH: 26.1 pg — AB (ref 26.6–33.0)
MCHC: 32.1 g/dL (ref 31.5–35.7)
MCV: 81 fL (ref 79–97)
Platelets: 296 10*3/uL (ref 150–450)
RBC: 4.18 x10E6/uL (ref 3.77–5.28)
RDW: 16 % — AB (ref 12.3–15.4)
WBC: 9.2 10*3/uL (ref 3.4–10.8)

## 2018-04-04 LAB — HEMOGLOBIN A1C
ESTIMATED AVERAGE GLUCOSE: 123 mg/dL
Hgb A1c MFr Bld: 5.9 % — ABNORMAL HIGH (ref 4.8–5.6)

## 2018-04-04 LAB — TSH: TSH: 1.33 u[IU]/mL (ref 0.450–4.500)

## 2018-04-16 ENCOUNTER — Telehealth: Payer: Self-pay

## 2018-04-16 NOTE — Telephone Encounter (Signed)
-----   Message from Claiborne RiggZelda W Fleming, NP sent at 04/11/2018  9:38 AM EDT ----- Thyroid is normal. Slightly anemic likely due to periods. She can take an iron tablet over the counter ferrous sulfate 325mg  every other day to help with this. Triglycerides are also increased. INSTRUCTIONS: Work on a low fat, heart healthy diet and participate in regular aerobic exercise program by working out at least 150 minutes per week. No fried foods. No red meat or beef. No junk foods, sodas, sugary drinks, unhealthy snacking, alcohol or smoking.

## 2018-04-16 NOTE — Telephone Encounter (Signed)
CMA attempt to call patient to inform on lab results.  No answer and left a VM for patient to call back.  If patient call back, please inform:  Thyroid is normal. Slightly anemic likely due to periods. She can take an iron tablet over the counter ferrous sulfate 325mg  every other day to help with this. Triglycerides are also increased. INSTRUCTIONS: Work on a low fat, heart healthy diet and participate in regular aerobic exercise program by working out at least 150 minutes per week. No fried foods. No red meat or beef. No junk foods, sodas, sugary drinks, unhealthy snacking, alcohol or smoking.

## 2018-05-15 ENCOUNTER — Ambulatory Visit: Payer: Self-pay

## 2018-05-15 ENCOUNTER — Encounter: Payer: Self-pay | Admitting: Nurse Practitioner

## 2018-05-15 ENCOUNTER — Ambulatory Visit: Payer: Self-pay | Attending: Nurse Practitioner | Admitting: Nurse Practitioner

## 2018-05-15 DIAGNOSIS — Z79899 Other long term (current) drug therapy: Secondary | ICD-10-CM | POA: Insufficient documentation

## 2018-05-15 DIAGNOSIS — L80 Vitiligo: Secondary | ICD-10-CM | POA: Insufficient documentation

## 2018-05-15 MED ORDER — BETAMETHASONE DIPROPIONATE 0.05 % EX LOTN: TOPICAL_LOTION | Freq: Two times a day (BID) | CUTANEOUS | 0 refills | Status: DC

## 2018-05-15 MED ORDER — BETAMETHASONE DIPROPIONATE 0.05 % EX LOTN: TOPICAL_LOTION | Freq: Two times a day (BID) | CUTANEOUS | 2 refills | Status: DC

## 2018-05-15 MED FILL — BETAMETHASONE DIPROPIONATE: 0.05 | 20 days supply | Qty: 60 | Fill #0

## 2018-05-15 NOTE — Progress Notes (Signed)
Assessment & Plan:  Kenady was seen today for follow-up.  Diagnoses and all orders for this visit:  Vitiligo -     betamethasone dipropionate 0.05 % lotion; Apply topically 2 (two) times daily.    Patient has been counseled on age-appropriate routine health concerns for screening and prevention. These are reviewed and up-to-date. Referrals have been placed accordingly. Immunizations are up-to-date or declined.    Subjective:   Chief Complaint  Patient presents with  . Follow-up    Patient is here to follow-up on skin rash. Patient stated the lotion seem like it's helping her.    HPI Rebecca Sharp 47 y.o. female presents to office today for follow up of vitiligo. She was prescribed topical therapy at her last appointment with me on 04-03-2018 for vitiligo. Today she endorses some improvement of the hyporpigmentation of her right hand and forearm. Will refill topical lotion today. She denies any new rashes or lesions.    Review of Systems  Constitutional: Negative for fever, malaise/fatigue and weight loss.  Eyes: Negative.  Negative for blurred vision, double vision and photophobia.  Respiratory: Negative.  Negative for cough and shortness of breath.   Cardiovascular: Negative.  Negative for chest pain, palpitations and leg swelling.  Gastrointestinal: Negative.  Negative for heartburn, nausea and vomiting.  Skin: Positive for rash.  Neurological: Negative.  Negative for dizziness, focal weakness, seizures and headaches.  Psychiatric/Behavioral: Negative.  Negative for suicidal ideas.    Past Medical History:  Diagnosis Date  . AMA (advanced maternal age) multigravida 35+   . Anemia   . Ganglion cyst 01/25/2017   Left ankle  . Kidney stones 2013   With pregnancy   . Language barrier   . Obese   . Obesity 01/25/2017  . Prediabetes 02/17/2016  . Pregnancy induced hypertension    at end of last 2 pregnancies    Past Surgical History:  Procedure Laterality Date  .  NO PAST SURGERIES      Family History  Problem Relation Age of Onset  . Diabetes Father   . Hypertension Father     Social History Reviewed with no changes to be made today.   Outpatient Medications Prior to Visit  Medication Sig Dispense Refill  . Multiple Vitamin (MULTIVITAMIN) tablet Take 1 tablet by mouth daily. Reported on 02/17/2016    . betamethasone dipropionate 0.05 % lotion Apply topically 2 (two) times daily. 60 mL 0   No facility-administered medications prior to visit.     No Known Allergies     Objective:    BP 133/83 (BP Location: Left Arm, Patient Position: Sitting, Cuff Size: Large)   Pulse 93   Temp 98.1 F (36.7 C) (Oral)   Ht 4\' 11"  (1.499 m)   Wt 202 lb 12.8 oz (92 kg)   SpO2 95%   BMI 40.96 kg/m  Wt Readings from Last 3 Encounters:  05/15/18 202 lb 12.8 oz (92 kg)  04/03/18 207 lb 3.2 oz (94 kg)  01/02/18 207 lb (93.9 kg)    Physical Exam  Constitutional: She is oriented to person, place, and time. She appears well-developed and well-nourished. She is cooperative.  HENT:  Head: Normocephalic and atraumatic.  Cardiovascular: Normal rate, regular rhythm and normal heart sounds. Exam reveals no gallop and no friction rub.  No murmur heard. Pulmonary/Chest: Effort normal and breath sounds normal. No tachypnea. No respiratory distress. She has no decreased breath sounds. She has no wheezes. She has no rhonchi. She has no  rales. She exhibits no tenderness.  Abdominal: Bowel sounds are normal.  Musculoskeletal: Normal range of motion. She exhibits no edema.  Neurological: She is alert and oriented to person, place, and time. Coordination normal.  Skin: Skin is warm and dry. Rash noted. Rash is macular.  Hypopigmented areas on right hand and right forearm  Psychiatric: She has a normal mood and affect. Her behavior is normal. Judgment and thought content normal.  Nursing note and vitals reviewed.        Patient has been counseled extensively about  nutrition and exercise as well as the importance of adherence with medications and regular follow-up. The patient was given clear instructions to go to ER or return to medical center if symptoms don't improve, worsen or new problems develop. The patient verbalized understanding.   Follow-up: No follow-ups on file.   Claiborne RiggZelda W Jayleen Scaglione, FNP-BC [facility] [place], [place] [phone] [id number]   05/15/2018, 4:05 PM

## 2018-06-22 ENCOUNTER — Encounter (HOSPITAL_COMMUNITY): Payer: Self-pay | Admitting: *Deleted

## 2018-06-22 ENCOUNTER — Emergency Department (HOSPITAL_COMMUNITY)
Admission: EM | Admit: 2018-06-22 | Discharge: 2018-06-22 | Disposition: A | Discharge status: Home / Self Care | Payer: No Typology Code available for payment source | Attending: Emergency Medicine | Admitting: Emergency Medicine

## 2018-06-22 ENCOUNTER — Other Ambulatory Visit: Payer: Self-pay

## 2018-06-22 DIAGNOSIS — M25511 Pain in right shoulder: Secondary | ICD-10-CM | POA: Insufficient documentation

## 2018-06-22 DIAGNOSIS — Y999 Unspecified external cause status: Secondary | ICD-10-CM | POA: Insufficient documentation

## 2018-06-22 DIAGNOSIS — Z79899 Other long term (current) drug therapy: Secondary | ICD-10-CM | POA: Insufficient documentation

## 2018-06-22 DIAGNOSIS — Y9389 Activity, other specified: Secondary | ICD-10-CM | POA: Insufficient documentation

## 2018-06-22 DIAGNOSIS — Y929 Unspecified place or not applicable: Secondary | ICD-10-CM | POA: Insufficient documentation

## 2018-06-22 DIAGNOSIS — I10 Essential (primary) hypertension: Secondary | ICD-10-CM | POA: Insufficient documentation

## 2018-06-22 DIAGNOSIS — X500XXA Overexertion from strenuous movement or load, initial encounter: Secondary | ICD-10-CM | POA: Insufficient documentation

## 2018-06-22 MED ORDER — METHOCARBAMOL 500 MG PO TABS
500.0000 mg | ORAL_TABLET | Freq: Once | ORAL | Status: AC
  Administered 2018-06-22: 500 mg via ORAL
  Filled 2018-06-22: qty 1

## 2018-06-22 MED ORDER — METHOCARBAMOL 500 MG PO TABS: 500.0000 mg | ORAL_TABLET | Freq: Two times a day (BID) | ORAL | 0 refills | Status: DC

## 2018-06-22 MED ORDER — NAPROXEN 500 MG PO TABS: 500.0000 mg | ORAL_TABLET | Freq: Two times a day (BID) | ORAL | 0 refills | Status: DC

## 2018-06-22 MED ORDER — NAPROXEN 250 MG PO TABS
500.0000 mg | ORAL_TABLET | Freq: Once | ORAL | Status: AC
  Administered 2018-06-22: 500 mg via ORAL
  Filled 2018-06-22: qty 2

## 2018-06-22 NOTE — ED Provider Notes (Signed)
[facility] EMERGENCY DEPARTMENT Provider Note   CSN: [id number] Arrival date & time: 06/22/18  1935     History   Chief Complaint Chief Complaint  Patient presents with  . Shoulder Pain    HPI Rebecca Sharp is a 47 y.o. female with a hx of anemia, kidney stones, obesity presents to the Emergency Department complaining of gradual, persistent, progressively worsening left shoulder pain onset 6:30PM after picking up a tote bag.  Pt reports she did not think the bag was very heavy.  No other new activity or heavy lifting.  Pt reports the pain is intense and in the posterior right shoulder.  She states the area feels "tight."  No treatments PTA.  Pt denies neck pain, falls, known trauma, numbness, tingling, weakness.  Additionally, pt denies SOB, CP, pain with exertion, abd pain, N/V/D, syncope.  She denies recent travel, leg swelling, calf tenderness, fever, chills, headache, vision changes.  Nothing seems to make the pain better.  It is worsened with palpation.  She reports full ROM of the right shoulder.    The history is provided by the patient and medical records. No language interpreter was used.    Past Medical History:  Diagnosis Date  . AMA (advanced maternal age) multigravida 35+   . Anemia   . Ganglion cyst 01/25/2017   Left ankle  . Kidney stones 2013   With pregnancy   . Language barrier   . Obese   . Obesity 01/25/2017  . Prediabetes 02/17/2016  . Pregnancy induced hypertension    at end of last 2 pregnancies    Patient Active Problem List   Diagnosis Date Noted  . Obesity 01/25/2017  . Ganglion cyst 01/25/2017  . Prediabetes 02/17/2016  . Essential hypertension 08/19/2015  . Kidney stones     Past Surgical History:  Procedure Laterality Date  . NO PAST SURGERIES       OB History    Gravida  3   Para  3   Term  3   Preterm  0   AB  0   Living  3     SAB  0   TAB  0   Ectopic  0   Multiple  0   Live Births  3           Home Medications    Prior to Admission medications   Medication Sig Start Date End Date Taking? Authorizing Provider  Multiple Vitamin (MULTIVITAMIN) tablet Take 1 tablet by mouth daily. Reported on 02/17/2016 05/22/15  [facility] Public  betamethasone dipropionate 0.05 % lotion Apply topically 2 (two) times daily. Patient not taking: Reported on 06/22/2018 05/15/18   Claiborne Rigg, NP  methocarbamol (ROBAXIN) 500 MG tablet Take 1 tablet (500 mg total) by mouth 2 (two) times daily. 06/22/18   Tysean [place], Dahlia Client, PA-C  naproxen (NAPROSYN) 500 MG tablet Take 1 tablet (500 mg total) by mouth 2 (two) times daily with a meal. 06/22/18   Mirtie Bastyr, Dahlia Client, PA-C    Family History Family History  Problem Relation Age of Onset  . Diabetes Father   . Hypertension Father     Social History Social History   Tobacco Use  . Smoking status: Never Smoker  . Smokeless tobacco: Never Used  Substance Use Topics  . Alcohol use: No    Alcohol/week: 0.0 standard drinks  . Drug use: No     Allergies   Patient has no known allergies.  Review of Systems Review of Systems  Constitutional: Negative for appetite change, diaphoresis, fatigue, fever and unexpected weight change.  HENT: Negative for mouth sores.   Eyes: Negative for visual disturbance.  Respiratory: Negative for cough, chest tightness, shortness of breath and wheezing.   Cardiovascular: Negative for chest pain.  Gastrointestinal: Negative for abdominal pain, constipation, diarrhea, nausea and vomiting.  Endocrine: Negative for polydipsia, polyphagia and polyuria.  Genitourinary: Negative for dysuria, frequency, hematuria and urgency.  Musculoskeletal: Positive for arthralgias. Negative for back pain and neck stiffness.  Skin: Negative for rash.  Allergic/Immunologic: Negative for immunocompromised state.  Neurological: Negative for syncope, light-headedness and headaches.  Hematological:  Does not bruise/bleed easily.  Psychiatric/Behavioral: Negative for sleep disturbance. The patient is not nervous/anxious.      Physical Exam Updated Vital Signs BP 135/79 (BP Location: Right Arm)   Pulse 93   Temp 98.9 F (37.2 C) (Oral)   Resp 16   Ht 5\' 3"  (1.6 m)   Wt 90.7 kg   SpO2 100%   BMI 35.43 kg/m   Physical Exam  Constitutional: She appears well-developed and well-nourished. No distress.  Awake, alert, nontoxic appearance  HENT:  Head: Normocephalic and atraumatic.  Mouth/Throat: Oropharynx is clear and moist. No oropharyngeal exudate.  FROM without pain No TTP along the midline or paraspinal muscles No step-off or deformity  Eyes: Conjunctivae are normal. No scleral icterus.  Neck: Normal range of motion. Neck supple.  Cardiovascular: Normal rate, regular rhythm and intact distal pulses.  Pulmonary/Chest: Effort normal and breath sounds normal. No respiratory distress. She has no wheezes.  Equal chest expansion  Abdominal: Soft. Bowel sounds are normal. She exhibits no mass. There is no tenderness. There is no rigidity, no rebound, no guarding, no CVA tenderness and negative Murphy's sign.  Musculoskeletal: Normal range of motion. She exhibits no edema.       Right shoulder: She exhibits tenderness, pain and spasm. She exhibits normal range of motion, no bony tenderness, no effusion, no deformity, no laceration and normal strength.  TTP along the right trapezius and posterior shoulder No TTP along the midline or paraspinal muscles of the T-spine or L-spine.    Neurological: She is alert.  Speech is clear and goal oriented Moves extremities without ataxia  Skin: Skin is warm and dry. She is not diaphoretic.  Psychiatric: She has a normal mood and affect.  Nursing note and vitals reviewed.    ED Treatments / Results   Procedures Procedures (including critical care time)  Medications Ordered in ED Medications  naproxen (NAPROSYN) tablet 500 mg (has no  administration in time range)  methocarbamol (ROBAXIN) tablet 500 mg (has no administration in time range)     Initial Impression / Assessment and Plan / ED Course  I have reviewed the triage vital signs and the nursing notes.  Pertinent labs & imaging results that were available during my care of the patient were reviewed by me and considered in my medical decision making (see chart for details).  Clinical Course as of Jun 22 2304  Sat Jun 22, 2018  2302 No tachycardia, no hypotension, afebrile  Pulse Rate: 93 [HM]    Clinical Course User Index [HM] Lyniah Fujita, Dahlia Client, [place]    Patient presents with posterior right shoulder pain and tenderness to palpation over the right trapezius.  She has no chest pain or shortness of breath.  No dyspnea on exertion.  Doubt acute coronary syndrome.  Patient has no abdominal pain, nausea or vomiting.  Abdomen is soft and nontender.  Doubt cholelithiasis or cholecystitis.  Patient is full range of motion without nuchal rigidity of her neck.  Suspect strain/spasm of the right trapezius.  She has full range of motion of the right shoulder and no joint line tenderness.  No evidence of septic joint.  No trauma to suggest fracture.  No indication for x-ray at this time.  Patient is low risk for DVT and is PERC negative.  Patient is well-appearing.  She does not appear distressed.  Naproxen and Robaxin given here in the emergency department.  She is to follow with her primary care physician for further evaluation in the next 48 hours.  She is to return to the emergency department for shortness of breath, chest pain, worsening shoulder pain, vomiting or other concerns.  Final Clinical Impressions(s) / ED Diagnoses   Final diagnoses:  Acute pain of right shoulder    ED Discharge Orders         Ordered    naproxen (NAPROSYN) 500 MG tablet  2 times daily with meals     06/22/18 2304    methocarbamol (ROBAXIN) 500 MG tablet  2 times daily     06/22/18 2304             Joycelynn Fritsche, Dahlia Client, PA-C 06/22/18 2305    Margarita Grizzle, MD 06/24/18 2017

## 2018-06-22 NOTE — Discharge Instructions (Signed)
1. Medications: alternate naprosyn and tylenol for pain control, Robaxin for muscle spasm; usual home medications 2. Treatment: rest, ice, drink plenty of fluids, gentle stretching 3. Follow Up: Please followup with your PCP in 2-3 days if no improvement for discussion of your diagnoses and further evaluation after today's visit; if you do not have a primary care doctor use the resource guide provided to find one; Please return to the ER for worsening symptoms or other concerns

## 2018-06-22 NOTE — ED Triage Notes (Signed)
THE PT HAS  HAD RT SHOULDER PAIN FOR ONE HOUR  NO KNOWN INJURY  SHE IS LAUGHING TALKING WITH FAMILY  NO DISTRESS

## 2018-07-03 ENCOUNTER — Ambulatory Visit: Payer: Self-pay | Attending: Nurse Practitioner | Admitting: Physician Assistant

## 2018-07-03 ENCOUNTER — Other Ambulatory Visit: Payer: Self-pay

## 2018-07-03 VITALS — BP 129/86 | HR 99 | Temp 98.1°F | Resp 16 | Wt 200.0 lb

## 2018-07-03 DIAGNOSIS — Z87442 Personal history of urinary calculi: Secondary | ICD-10-CM | POA: Insufficient documentation

## 2018-07-03 DIAGNOSIS — Z789 Other specified health status: Secondary | ICD-10-CM

## 2018-07-03 DIAGNOSIS — R12 Heartburn: Secondary | ICD-10-CM

## 2018-07-03 DIAGNOSIS — R7303 Prediabetes: Secondary | ICD-10-CM | POA: Insufficient documentation

## 2018-07-03 DIAGNOSIS — Z603 Acculturation difficulty: Secondary | ICD-10-CM

## 2018-07-03 DIAGNOSIS — Z09 Encounter for follow-up examination after completed treatment for conditions other than malignant neoplasm: Secondary | ICD-10-CM

## 2018-07-03 DIAGNOSIS — E669 Obesity, unspecified: Secondary | ICD-10-CM | POA: Insufficient documentation

## 2018-07-03 DIAGNOSIS — L80 Vitiligo: Secondary | ICD-10-CM

## 2018-07-03 DIAGNOSIS — M25511 Pain in right shoulder: Secondary | ICD-10-CM

## 2018-07-03 MED ORDER — OMEPRAZOLE 20 MG PO CPDR: 20.0000 mg | DELAYED_RELEASE_CAPSULE | Freq: Every day | ORAL | 3 refills | Status: DC

## 2018-07-03 MED ORDER — BETAMETHASONE DIPROPIONATE 0.05 % EX LOTN: TOPICAL_LOTION | Freq: Two times a day (BID) | CUTANEOUS | 2 refills | Status: DC

## 2018-07-03 MED FILL — ?OMEPRazole 20mg CPDR: 20 | 30 days supply | Qty: 30 | Fill #0

## 2018-07-03 MED FILL — BETAMETHASONE DIPROPIONATE: 0.05 | 60 days supply | Qty: 60 | Fill #0

## 2018-07-03 NOTE — Progress Notes (Signed)
Right shoulder pain

## 2018-07-03 NOTE — Progress Notes (Signed)
Patient ID: Rebecca Sharp, female   DOB: 07/07/1971, 47 y.o.   MRN: [id number]         Rebecca Sharp, is a 47 y.o. female  [id number]  [id number]  DOB - 07/07/1971  Subjective:  Chief Complaint and HPI: Rebecca Sharp is a 47 y.o. female here today After being seen in the ED for shoulder pain 07/02/2018.  She took naproxen and methocarbamol and the problem is resolved.    She c/o many years h/o heartburn.  No OTC.  No CP.  No N/V.  Worse when she lays down.  Some burping and excess gas.  No changes in BMs.  No melena, no hematochezia.    From ED note:  Patient presents with posterior right shoulder pain and tenderness to palpation over the right trapezius.  She has no chest pain or shortness of breath.  No dyspnea on exertion.  Doubt acute coronary syndrome.  Patient has no abdominal pain, nausea or vomiting.  Abdomen is soft and nontender.  Doubt cholelithiasis or cholecystitis.  Patient is full range of motion without nuchal rigidity of her neck.  Suspect strain/spasm of the right trapezius.  She has full range of motion of the right shoulder and no joint line tenderness.  No evidence of septic joint.  No trauma to suggest fracture.  No indication for x-ray at this time.  Patient is low risk for DVT and is PERC negative.  Patient is well-appearing.  She does not appear distressed.  Naproxen and Robaxin given here in the emergency department.  She is to follow with her primary care physician for further evaluation in the next 48 hours.  She is to return to the emergency department for shortness of breath, chest pain, worsening shoulder pain, vomiting or other concerns.   ROS:   Constitutional:  No f/c, No night sweats, No unexplained weight loss. EENT:  No vision changes, No blurry vision, No hearing changes. No mouth, throat, or ear problems.  Respiratory: No cough, No SOB Cardiac: No CP, no palpitations GI:  No abd pain, No N/V/D. GU: No Urinary  s/sx Musculoskeletal: No joint pain Neuro: No headache, no dizziness, no motor weakness.  Skin: No rash Endocrine:  No polydipsia. No polyuria.  Psych: Denies SI/HI  No problems updated.  ALLERGIES: No Known Allergies  PAST MEDICAL HISTORY: Past Medical History:  Diagnosis Date  . AMA (advanced maternal age) multigravida 35+   . Anemia   . Ganglion cyst 01/25/2017   Left ankle  . Kidney stones 2013   With pregnancy   . Language barrier   . Obese   . Obesity 01/25/2017  . Prediabetes 02/17/2016  . Pregnancy induced hypertension    at end of last 2 pregnancies    MEDICATIONS AT HOME: Prior to Admission medications   Medication Sig Start Date End Date Taking? Authorizing Provider  methocarbamol (ROBAXIN) 500 MG tablet Take 1 tablet (500 mg total) by mouth 2 (two) times daily. 06/22/18  Yes Muthersbaugh, Dahlia ClientHannah, PA-C  Multiple Vitamin (MULTIVITAMIN) tablet Take 1 tablet by mouth daily. Reported on 02/17/2016 05/22/15  Yes [facility]  naproxen (NAPROSYN) 956500 MG tablet Take 1 tablet (500 mg total) by mouth 2 (two) times daily with a meal. 06/22/18  Yes Muthersbaugh, Dahlia ClientHannah, PA-C  betamethasone dipropionate 0.05 % lotion Apply topically 2 (two) times daily. 07/03/18   Anders SimmondsMcClung, Maxamillion Banas M, PA-C  omeprazole (PRILOSEC) 20 MG capsule Take 1 capsule (20 mg total) by mouth daily.  07/03/18   Anders Simmonds, PA-C     Objective:  EXAM:   Vitals:   07/03/18 1448  BP: 129/86  Pulse: 99  Resp: 16  Temp: 98.1 F (36.7 C)  TempSrc: Oral  SpO2: 98%  Weight: 200 lb (90.7 kg)    General appearance : A&OX3. NAD. Non-toxic-appearing HEENT: Atraumatic and Normocephalic.  PERRLA. EOM intact.  TM clear B. Mouth-MMM, post pharynx WNL w/o erythema, No PND. Neck: supple, no JVD. No cervical lymphadenopathy. No thyromegaly Chest/Lungs:  Breathing-non-labored, Good air entry bilaterally, breath sounds normal without rales, rhonchi, or wheezing  CVS: S1 S2 regular, no  murmurs, gallops, rubs  Abdomen: Bowel sounds present, Non tender and not distended with no gaurding, rigidity or rebound. Extremities: Bilateral Lower Ext shows no edema, both legs are warm to touch with = pulse throughout Neurology:  CN II-XII grossly intact, Non focal.   Psych:  TP linear. J/I WNL. Normal speech. Appropriate eye contact and affect.  Skin:  No Rash  Data Review Lab Results  Component Value Date   HGBA1C 5.9 (H) 04/03/2018   HGBA1C 5.6 05/03/2017   HGBA1C 5.6 05/18/2016     Assessment & Plan   1. Vitiligo - betamethasone dipropionate 0.05 % lotion; Apply topically 2 (two) times daily.  Dispense: 60 mL; Refill: 2  2. Heartburn - omeprazole (PRILOSEC) 20 MG capsule; Take 1 capsule (20 mg total) by mouth daily.  Dispense: 30 capsule; Refill: 3 - Comprehensive metabolic panel - H. pylori breath test  3. Acute pain of right shoulder resolved  4. Encounter for examination following treatment at hospital improved  5. Language barrier stratus interpreters used and additional time performing visit was required.      Patient have been counseled extensively about nutrition and exercise  F/up 3 months or sooner if needed  The patient was given clear instructions to go to ER or return to medical center if symptoms don't improve, worsen or new problems develop. The patient verbalized understanding. The patient was told to call to get lab results if they haven't heard anything in the next week.     Georgian Co, PA-C [facility] [place], [place] [phone]   07/03/2018, 3:18 PM

## 2018-07-05 LAB — COMPREHENSIVE METABOLIC PANEL
ALK PHOS: 68 IU/L (ref 39–117)
ALT: 34 IU/L — AB (ref 0–32)
AST: 63 IU/L — ABNORMAL HIGH (ref 0–40)
Albumin/Globulin Ratio: 1.2 (ref 1.2–2.2)
Albumin: 4.1 g/dL (ref 3.5–5.5)
BILIRUBIN TOTAL: 0.2 mg/dL (ref 0.0–1.2)
BUN/Creatinine Ratio: 18 (ref 9–23)
BUN: 13 mg/dL (ref 6–24)
CO2: 21 mmol/L (ref 20–29)
CREATININE: 0.72 mg/dL (ref 0.57–1.00)
Calcium: 9.1 mg/dL (ref 8.7–10.2)
Chloride: 102 mmol/L (ref 96–106)
GFR, EST AFRICAN AMERICAN: 116 mL/min/{1.73_m2} (ref >59)
GFR, EST NON AFRICAN AMERICAN: 101 mL/min/{1.73_m2} (ref >59)
Globulin, Total: 3.4 g/dL (ref 1.5–4.5)
Glucose: 138 mg/dL — ABNORMAL HIGH (ref 65–99)
Potassium: 4.7 mmol/L (ref 3.5–5.2)
Sodium: 138 mmol/L (ref 134–144)
Total Protein: 7.5 g/dL (ref 6.0–8.5)

## 2018-07-05 LAB — HELICOBACTER PYLORI  ANTIBODY, IGM: H pylori, IgM Abs: 9.9 units — ABNORMAL HIGH (ref 0.0–8.9)

## 2018-07-09 ENCOUNTER — Telehealth: Payer: Self-pay | Admitting: *Deleted

## 2018-07-09 NOTE — Telephone Encounter (Signed)
Medical Assistant used [employer] to contact patient.  Interpreter Name: Randas Interpreter #: [phone] !!!Please inform patient of blood sugar being elevated and needing to eliminate sugar and white cards to avoid developing diabetes. Liver test was also elevated and needs to avoid alcohol and products containing. Patient is also negative for stomach ulcer bacteria, follow up as planned!!!

## 2018-07-09 NOTE — Telephone Encounter (Signed)
-----   Message from Anders SimmondsAngela M McClung, [place] JerseyPA-C sent at 07/07/2018  6:53 PM EDT ----- Please call patient.  Her blood sugar was a little high.  She needs to eliminate sugar and white carbs from her diet or she could develop diabetes.  Her liver function test is a little high.  Avoid alcoholo and products that contain alcohol.  Her stomach ulcer test was negative.  F/up as planned.  Thanks, Georgian CoAngela McClung, PA-C

## 2018-10-02 ENCOUNTER — Other Ambulatory Visit: Payer: Self-pay

## 2018-10-02 ENCOUNTER — Ambulatory Visit: Payer: Self-pay | Attending: Nurse Practitioner | Admitting: Nurse Practitioner

## 2018-10-02 ENCOUNTER — Encounter: Payer: Self-pay | Admitting: Nurse Practitioner

## 2018-10-02 VITALS — BP 125/84 | HR 92 | Temp 98.5°F | Ht 60.0 in | Wt 199.4 lb

## 2018-10-02 DIAGNOSIS — L80 Vitiligo: Secondary | ICD-10-CM | POA: Insufficient documentation

## 2018-10-02 DIAGNOSIS — Z791 Long term (current) use of non-steroidal anti-inflammatories (NSAID): Secondary | ICD-10-CM | POA: Insufficient documentation

## 2018-10-02 DIAGNOSIS — Z833 Family history of diabetes mellitus: Secondary | ICD-10-CM | POA: Insufficient documentation

## 2018-10-02 DIAGNOSIS — E669 Obesity, unspecified: Secondary | ICD-10-CM | POA: Insufficient documentation

## 2018-10-02 DIAGNOSIS — Z6838 Body mass index (BMI) 38.0-38.9, adult: Secondary | ICD-10-CM | POA: Insufficient documentation

## 2018-10-02 DIAGNOSIS — R7303 Prediabetes: Secondary | ICD-10-CM | POA: Insufficient documentation

## 2018-10-02 DIAGNOSIS — Z79899 Other long term (current) drug therapy: Secondary | ICD-10-CM | POA: Insufficient documentation

## 2018-10-02 LAB — POCT GLYCOSYLATED HEMOGLOBIN (HGB A1C): HBA1C, POC (PREDIABETIC RANGE): 6.1 % (ref 5.7–6.4)

## 2018-10-02 LAB — GLUCOSE, POCT (MANUAL RESULT ENTRY): POC Glucose: 124 mg/dl — AB (ref 70–99)

## 2018-10-02 MED ORDER — BETAMETHASONE DIPROPIONATE 0.05 % EX LOTN: TOPICAL_LOTION | Freq: Two times a day (BID) | CUTANEOUS | 2 refills | Status: DC

## 2018-10-02 MED FILL — BETAMETHASONE DIPROPIONATE: 0.05 | 30 days supply | Qty: 60 | Fill #0

## 2018-10-02 NOTE — Progress Notes (Signed)
Assessment & Plan:  Rebecca Sharp was seen Rebecca Sharp for medical management of chronic issues.  Diagnoses and all orders for this visit:  Prediabetes -     Glucose (CBG) -     POCT glycosylated hemoglobin (Hb A1C) Continue blood sugar control as discussed in office today, low carbohydrate diet, and regular physical exercise as tolerated, 150 minutes per week (30 min each day, 5 days per week, or 50 min 3 days per week).  Annual eye exams and foot exams are recommended.   Vitiligo -     betamethasone dipropionate 0.05 % lotion; Apply topically 2 (two) times daily. Chronic and stable.     Patient has been counseled on age-appropriate routine health concerns for screening and prevention. These are reviewed and up-to-date. Referrals have been placed accordingly. Immunizations are up-to-date or declined.    Subjective:   Chief Complaint  Patient presents with  . Medical Management of Chronic Issues   HPI Rebecca Sharp 47 y.o. female presents to office today for follow up to prediabetes.    Prediabetes Chronic and well controlled with diet. She is doing well however does not exercise. Her weight is stable. She denies any hypo or hyperglycemic symptoms.  She is overdue for eye exam. Patient has been advised to apply for financial assistance and schedule to see our financial counselor. She is not taking an ACE. She does not have a glucometer and does not monitor her blood glucose levels at home.  Lab Results  Component Value Date   HGBA1C 5.9 (H) 04/03/2018    Skin Problem She has a history of vitiligo which is improving with betamethasone dipropionate lotion.   Review of Systems  Constitutional: Negative for fever, malaise/fatigue and weight loss.  HENT: Negative.  Negative for nosebleeds.   Eyes: Negative.  Negative for blurred vision, double vision and photophobia.  Respiratory: Negative.  Negative for cough and shortness of breath.   Cardiovascular: Negative.  Negative for  chest pain, palpitations and leg swelling.  Gastrointestinal: Negative.  Negative for heartburn, nausea and vomiting.  Musculoskeletal: Negative.  Negative for myalgias.  Skin:       vitiligo  Neurological: Negative.  Negative for dizziness, focal weakness, seizures and headaches.  Psychiatric/Behavioral: Negative.  Negative for suicidal ideas.    Past Medical History:  Diagnosis Date  . AMA (advanced maternal age) multigravida 35+   . Anemia   . Ganglion cyst 01/25/2017   Left ankle  . Kidney stones 2013   With pregnancy   . Language barrier   . Obese   . Obesity 01/25/2017  . Prediabetes 02/17/2016  . Pregnancy induced hypertension    at end of last 2 pregnancies    Past Surgical History:  Procedure Laterality Date  . NO PAST SURGERIES      Family History  Problem Relation Age of Onset  . Diabetes Father   . Hypertension Father     Social History Reviewed with no changes to be made today.   Outpatient Medications Prior to Visit  Medication Sig Dispense Refill  . methocarbamol (ROBAXIN) 500 MG tablet Take 1 tablet (500 mg total) by mouth 2 (two) times daily. 20 tablet 0  . Multiple Vitamin (MULTIVITAMIN) tablet Take 1 tablet by mouth daily. Reported on 02/17/2016    . naproxen (NAPROSYN) 500 MG tablet Take 1 tablet (500 mg total) by mouth 2 (two) times daily with a meal. 30 tablet 0  . omeprazole (PRILOSEC) 20 MG capsule Take 1 capsule (20 mg  total) by mouth daily. 30 capsule 3  . betamethasone dipropionate 0.05 % lotion Apply topically 2 (two) times daily. 60 mL 2   No facility-administered medications prior to visit.     No Known Allergies     Objective:    BP 125/84   Pulse 92   Temp 98.5 F (36.9 C) (Oral)   Ht 5' (1.524 m)   Wt 199 lb 6.4 oz (90.4 kg)   SpO2 96%   BMI 38.94 kg/m  Wt Readings from Last 3 Encounters:  10/02/18 199 lb 6.4 oz (90.4 kg)  07/03/18 200 lb (90.7 kg)  06/22/18 200 lb (90.7 kg)    Physical Exam Vitals signs and nursing note  reviewed.  Constitutional:      Appearance: She is well-developed.  HENT:     Head: Normocephalic and atraumatic.  Neck:     Musculoskeletal: Normal range of motion.  Cardiovascular:     Rate and Rhythm: Normal rate and regular rhythm.     Heart sounds: Normal heart sounds. No murmur. No friction rub. No gallop.   Pulmonary:     Effort: Pulmonary effort is normal. No tachypnea or respiratory distress.     Breath sounds: Normal breath sounds. No decreased breath sounds, wheezing, rhonchi or rales.  Chest:     Chest wall: No tenderness.  Abdominal:     General: Bowel sounds are normal.     Palpations: Abdomen is soft.  Musculoskeletal: Normal range of motion.  Skin:    General: Skin is warm and dry.     Findings: Rash present. Rash is macular.     Comments: Macular hypopigmented areas on bilateral hands and forearms  Neurological:     Mental Status: She is alert and oriented to person, place, and time.     Coordination: Coordination normal.  Psychiatric:        Behavior: Behavior normal. Behavior is cooperative.        Thought Content: Thought content normal.        Judgment: Judgment normal.        Patient has been counseled extensively about nutrition and exercise as well as the importance of adherence with medications and regular follow-up. The patient was given clear instructions to go to ER or return to medical center if symptoms don't improve, worsen or new problems develop. The patient verbalized understanding.   Follow-up: Return in about 3 months (around 01/01/2019) for FASTING labs and Physical.   Claiborne Rigg, FNP-BC [facility] [place], [place] [phone]   10/06/2018, 7:52 PM

## 2018-10-02 NOTE — Patient Instructions (Signed)
Plan de alimentación para personas con prediabetes  Prediabetes Eating Plan  La prediabetes es una afección que hace que los niveles de azúcar en la sangre (glucosa) sean más altos de lo normal. Esto aumenta el riesgo de tener diabetes. Para prevenir la diabetes, es posible que su médico le recomiende cambios en la dieta y otros cambios en su estilo de vida que lo ayuden a lograr lo siguiente:  · Controlar los niveles de glucemia.  · Mejorar los niveles de colesterol.  · Controlar la presión arterial.  El médico puede recomendarle que trabaje con un especialista en alimentación y nutrición (nutricionista) para elaborar el plan de comidas más conveniente para usted.  Consejos para seguir este plan:  Estilo de vida  · Establezca metas para bajar de peso con la ayuda de su equipo de atención médica. A la mayoría de las personas con prediabetes se les recomienda bajar un 7 % de su peso corporal.  · Haga ejercicio al menos 30 minutos 5 días por semana, como mínimo.  · Asista a un grupo de apoyo o solicite el apoyo continuo de un consejero de salud mental.  · Tome los medicamentos de venta libre y los recetados solamente como se lo haya indicado el médico.  Leer las etiquetas de los alimentos  · Lea las etiquetas de los alimentos envasados para controlar la cantidad de grasa, sal (sodio) y azúcar que contienen. Evite los alimentos que contengan lo siguiente:  ? Grasas saturadas.  ? Grasas trans.  ? Azúcares agregados.  · Evite los alimentos que contengan más de 300 miligramos (mg) de sodio por porción. Limite el consumo diario de sodio a menos de 2300 mg por día.  De compras  · Evite comprar alimentos procesados y preelaborados.  Cocción  · Cocine con aceite de oliva. No use mantequilla, manteca de cerdo o mantequilla clarificada.  · Cocine los alimentos al horno, a la parrilla, asados o hervidos. Evite freírlos.  Planificación de las comidas    · Trabaje con el nutricionista para crear un plan de alimentación que sea  adecuado para usted. Esto puede incluir lo siguiente:  ? Registro de la cantidad de calorías que ingiere. Use un registro de alimentos, un cuaderno o una aplicación móvil para anotar lo que comió en cada comida.  ? Uso del índice glucémico (IG) para planificar las comidas. El índice muestra con qué rapidez elevará la glucemia un alimento. Elija alimentos con bajo IG. Estos demoran más en elevar la glucemia.  · Considere la posibilidad de seguir una dieta mediterránea. Esta dieta incluye lo siguiente:  ? Varias porciones de frutas y verduras frescas por día.  ? Pescado al menos dos veces por semana.  ? Varias porciones de cereales integrales, frijoles, frutos secos y semillas por día.  ? Aceite de oliva en lugar de otras grasas.  ? Consumo moderado de alcohol.  ? Pequeñas cantidades de carnes rojas y lácteos enteros.  · Si tiene hipertensión arterial, quizás deba limitar el consumo de sodio o seguir una dieta como el plan de alimentación basado en Enfoques Alimentarios para Detener la Hipertensión (Dietary Approaches to Stop Hypertension, DASH). Este es un plan de alimentación cuyo objetivo es bajar la hipertensión arterial.  ¿Qué alimentos se recomiendan?  Es posible que los alimentos incluidos a continuación no constituyan la lista completa. Hable con el nutricionista sobre las mejores opciones alimenticias para usted.  Cereales  Productos integrales, como panes, galletas, cereales y pastas de salvado o integrales. Avena sin azúcar. Trigo burgol. Cebada. Quinua. Arroz integral. Tacos   o tortillas de harina de maíz o de salvado.  Verduras  Lechuga. Espinaca. Guisantes. Remolachas. Coliflor. Repollo. Brócoli. Zanahorias. Tomates. Calabaza. Berenjena. Hierbas. Pimienta. Cebollas. Pepinos. Repollitos de Bruselas.  Frutas  Frutos rojos. Bananas. Manzanas. Naranjas. Uvas. Papaya. Mango. Granada. Kiwi. Pomelo. Cerezas.  Carnes y otros alimentos ricos en proteínas  Mariscos. Carne de ave sin piel. Cortes magros de cerdo y  carne de res. Tofu. Huevos. Frutos secos. Frijoles.  Lácteos  Productos lácteos descremados o semidescremados, como yogur, queso cottage y queso.  Bebidas  Agua. Té. Café. Gaseosas sin azúcar o dietéticas. Soda. Leche descremada o semidescremada. Productos alternativos a la leche, como leche de soja o de almendras.  Grasas y aceites  Aceite de oliva. Aceite de canola. Aceite de girasol. Aceite de semillas de uva. [place]ac[place]. Nueces.  Dulces y postres  Pudin sin azúcar o con bajo contenido de grasa. Helado y otros postres congelados sin azúcar o con bajo contenido de grasa.  Condimentos y otros alimentos  Hierbas. Especias sin sodio. Mostaza. Salsa de pepinillos. Kétchup con bajo contenido de grasa y de azúcar. Salsa barbacoa con bajo contenido de grasa y de azúcar. Mayonesa con bajo contenido de grasa o sin grasa.  ¿Qué alimentos no se recomiendan?  Es posible que los alimentos incluidos a continuación no constituyan la lista completa. Hable con el nutricionista sobre las mejores opciones alimenticias para usted.  Cereales  Productos elaborados con harina y harina blanca refinada, como panes, pastas, bocadillos y cereales.  Verduras  Verduras enlatadas. Verduras congeladas con mantequilla o salsa de crema.  Frutas  Frutas enlatadas al almíbar.  Carnes y otros alimentos ricos en proteínas  Cortes de carne con grasa. Carne de ave con piel. Carne empanizada o frita. Carnes procesadas.  Lácteos  Yogur, queso o leche enteros.  Bebidas  Bebidas azucaradas, como té helado dulce y gaseosas.  Grasas y aceites  Mantequilla. Manteca de cerdo. Mantequilla clarificada.  Dulces y postres  Productos horneados, como pasteles, pastelitos, galletas dulces y tarta de queso.  Condimentos y otros alimentos  Mezclas de especias con sal agregada. Kétchup. Salsa barbacoa. Mayonesa.  Resumen  · Para prevenir la diabetes, es posible que deba hacer cambios en la dieta y otros cambios en su estilo de vida para ayudar a controlar el azúcar en la  sangre, mejorar los niveles de colesterol y controlar la presión arterial.  · Establezca metas para bajar de peso con la ayuda de su equipo de atención médica. A la mayoría de las personas con prediabetes se les recomienda bajar un 7 por ciento de su peso corporal.  · Considere la posibilidad de seguir una dieta mediterránea que incluya muchas frutas y verduras frescas, cereales integrales, frijoles, frutos secos, semillas, pescado, carnes magras, lácteos descremados y aceites saludables.  Esta información no tiene como fin reemplazar el consejo del médico. Asegúrese de hacerle al médico cualquier pregunta que tenga.  Document Released: 06/23/2015 Document Revised: 04/16/2017 Document Reviewed: 04/16/2017  Elsevier Interactive Patient Education © 2019 [employer]

## 2018-10-06 ENCOUNTER — Encounter: Payer: Self-pay | Admitting: Nurse Practitioner

## 2018-10-16 ENCOUNTER — Ambulatory Visit (HOSPITAL_COMMUNITY)
Admission: EM | Admit: 2018-10-16 | Discharge: 2018-10-16 | Disposition: A | Discharge status: Home / Self Care | Payer: Self-pay | Attending: Family Medicine | Admitting: Family Medicine

## 2018-10-16 ENCOUNTER — Encounter (HOSPITAL_COMMUNITY): Payer: Self-pay

## 2018-10-16 DIAGNOSIS — N644 Mastodynia: Secondary | ICD-10-CM | POA: Insufficient documentation

## 2018-10-16 DIAGNOSIS — N63 Unspecified lump in unspecified breast: Secondary | ICD-10-CM | POA: Insufficient documentation

## 2018-10-16 MED ORDER — NAPROXEN 375 MG PO TABS: 375.0000 mg | ORAL_TABLET | Freq: Two times a day (BID) | ORAL | 0 refills | Status: DC

## 2018-10-16 NOTE — ED Triage Notes (Signed)
Pt presents with pain in left breast.

## 2018-10-16 NOTE — Discharge Instructions (Addendum)
Naproxen prescribed.  Use as needed for pain Breast ultrasound ordered.  [facility] should be in contact with you to schedule that appointment.  Please call them or the office if you have not heard from them by next week.   Please follow up with PCP following the results of your test(s) Return or go to the ED if you have any new or worsening symptoms

## 2018-10-16 NOTE — ED Provider Notes (Signed)
[facility]   [id number] 10/16/18 Arrival Time: 7829  CC: Left breast pain  SUBJECTIVE: Hx obtained through family friend.  Declines interpretor.    Rebecca Sharp is a 48 y.o. female who presents with a left breast pain x 2 days.  Denies trauma, or precipitating event.  Localizes pain to left breast.  Describes it as intermittent and "cramping" in character.  Denies aggravating or alleviating factors.  Reports similar symptoms in the past after an injury to her breast.  Last mammogram February of 2019 and normal.   Denies fever, chills, nausea, vomiting, erythema, skin changes, dimpling, nipple discharge, axillary LAD, SOB, chest pain, abdominal pain, changes in bowel or bladder function.    Denies family hx of breast/ ovarian cancer  ROS: As per HPI.  Past Medical History:  Diagnosis Date  . AMA (advanced maternal age) multigravida 35+   . Anemia   . Ganglion cyst 01/25/2017   Left ankle  . Kidney stones 2013   With pregnancy   . Language barrier   . Obese   . Obesity 01/25/2017  . Prediabetes 02/17/2016  . Pregnancy induced hypertension    at end of last 2 pregnancies   Past Surgical History:  Procedure Laterality Date  . NO PAST SURGERIES     No Known Allergies No current facility-administered medications on file prior to encounter.    Current Outpatient Medications on File Prior to Encounter  Medication Sig Dispense Refill  . betamethasone dipropionate 0.05 % lotion Apply topically 2 (two) times daily. 60 mL 2  . methocarbamol (ROBAXIN) 500 MG tablet Take 1 tablet (500 mg total) by mouth 2 (two) times daily. 20 tablet 0  . Multiple Vitamin (MULTIVITAMIN) tablet Take 1 tablet by mouth daily. Reported on 02/17/2016    . omeprazole (PRILOSEC) 20 MG capsule Take 1 capsule (20 mg total) by mouth daily. 30 capsule 3   Social History   Socioeconomic History  . Marital status: Single    Spouse name: Not on file  . Number of children: 3  . Years of education:  <8th grade  . Highest education level: Not on file  Occupational History  . Occupation: Multimedia programmer: [employer]  Social Needs  . Financial resource strain: Not on file  . Food insecurity:    Worry: Not on file    Inability: Not on file  . Transportation needs:    Medical: Not on file    Non-medical: Not on file  Tobacco Use  . Smoking status: Never Smoker  . Smokeless tobacco: Never Used  Substance and Sexual Activity  . Alcohol use: No    Alcohol/week: 0.0 standard drinks  . Drug use: No  . Sexual activity: Yes    Birth control/protection: Condom    Comment: No current boyfriend or spouse-not interested  Lifestyle  . Physical activity:    Days per week: Not on file    Minutes per session: Not on file  . Stress: Not on file  Relationships  . Social connections:    Talks on phone: Not on file    Gets together: Not on file    Attends religious service: Not on file    Active member of club or organization: Not on file    Attends meetings of clubs or organizations: Not on file    Relationship status: Not on file  . Intimate partner violence:    Fear of current or ex partner: Not on file    Emotionally  abused: Not on file    Physically abused: Not on file    Forced sexual activity: Not on file  Other Topics Concern  . Not on file  Social History Narrative  . Not on file   Family History  Problem Relation Age of Onset  . Diabetes Father   . Hypertension Father     OBJECTIVE: Vitals:   10/16/18 0948  BP: (!) 149/90  Pulse: 88  Resp: 16  Temp: 97.9 F (36.6 C)  TempSrc: Oral  SpO2: 98%    General appearance: alert; no distress Head: NCAT Lungs: clear to auscultation bilaterally Heart: regular rate and rhythm.  Radial pulse 2+ bilaterally Breast: Declines chaperone: Breast appear symmetrical without obvious deformity or erythema; 2x2 cm lump appreciated over LUQ in 1-3 o'clock position,  lump freely moveable, mildly TTP; no nipple discharge; no axillary  LAD Neuro: Ambulates without difficulty Psychological: alert and cooperative; normal mood and affect  ASSESSMENT & PLAN:  1. Breast lump in upper outer quadrant   2. Breast pain, left     Meds ordered this encounter  Medications  . naproxen (NAPROSYN) 375 MG tablet    Sig: Take 1 tablet (375 mg total) by mouth 2 (two) times daily.    Dispense:  20 tablet    Refill:  0    Order Specific Question:   Supervising Provider    Answer:   Eustace Moore [[id number]]   Naproxen prescribed.  Use as needed for pain Breast ultrasound ordered.  [facility] should be in contact with you to schedule that appointment.  Please call them or the office if you have not heard from them by next week.   Please follow up with PCP following the results of your test(s) Return or go to the ED if you have any new or worsening symptoms   Reviewed expectations re: course of current medical issues. Questions answered. Outlined signs and symptoms indicating need for more acute intervention. Patient verbalized understanding. After Visit Summary given.   Rennis Harding, PA-C 10/16/18 1035

## 2018-10-17 ENCOUNTER — Ambulatory Visit: Payer: Self-pay | Attending: Family Medicine

## 2018-10-18 ENCOUNTER — Other Ambulatory Visit: Payer: Self-pay | Admitting: Emergency Medicine

## 2018-10-18 DIAGNOSIS — N6320 Unspecified lump in the left breast, unspecified quadrant: Secondary | ICD-10-CM

## 2018-10-23 ENCOUNTER — Other Ambulatory Visit: Payer: Self-pay | Admitting: Emergency Medicine

## 2018-10-28 ENCOUNTER — Other Ambulatory Visit (HOSPITAL_COMMUNITY): Payer: Self-pay | Admitting: *Deleted

## 2018-10-28 ENCOUNTER — Encounter (HOSPITAL_COMMUNITY): Payer: Self-pay | Admitting: Emergency Medicine

## 2018-10-28 ENCOUNTER — Emergency Department (HOSPITAL_COMMUNITY)
Admission: EM | Admit: 2018-10-28 | Discharge: 2018-10-28 | Disposition: A | Discharge status: Home / Self Care | Payer: Self-pay | Attending: Emergency Medicine | Admitting: Emergency Medicine

## 2018-10-28 ENCOUNTER — Other Ambulatory Visit: Payer: Self-pay

## 2018-10-28 DIAGNOSIS — W208XXA Other cause of strike by thrown, projected or falling object, initial encounter: Secondary | ICD-10-CM | POA: Insufficient documentation

## 2018-10-28 DIAGNOSIS — Y939 Activity, unspecified: Secondary | ICD-10-CM | POA: Insufficient documentation

## 2018-10-28 DIAGNOSIS — Y929 Unspecified place or not applicable: Secondary | ICD-10-CM | POA: Insufficient documentation

## 2018-10-28 DIAGNOSIS — N644 Mastodynia: Secondary | ICD-10-CM

## 2018-10-28 DIAGNOSIS — S0990XA Unspecified injury of head, initial encounter: Secondary | ICD-10-CM | POA: Insufficient documentation

## 2018-10-28 DIAGNOSIS — Y998 Other external cause status: Secondary | ICD-10-CM | POA: Insufficient documentation

## 2018-10-28 DIAGNOSIS — Z79899 Other long term (current) drug therapy: Secondary | ICD-10-CM | POA: Insufficient documentation

## 2018-10-28 NOTE — ED Notes (Signed)
Patient verbalizes understanding of discharge instructions. Opportunity for questioning and answers were provided. Armband removed by staff, pt discharged from ED.  

## 2018-10-28 NOTE — ED Provider Notes (Signed)
[facility] EMERGENCY DEPARTMENT Provider Note   CSN: [id number] Arrival date & time: 10/28/18  1659     History   Chief Complaint Chief Complaint  Patient presents with  . Head Injury  History is obtained from professional medical interpreter using language line.  Patient speaks no English  HPI Rebecca Sharp is a 48 y.o. female.  HPI Patient reports that a drill fell onto her head this morning.  She complains of pain at the top of her head which is nonradiating at the site where the drill struck her head.  She did not lose consciousness did not follow ground has no visual changes.  Denies any neck pain.  Pain was initially severe now is minimal.  She treated herself with Tylenol earlier today with relief.  No other associated symptoms nothing made symptoms better or worse.  Pain is improving steadily. Past Medical History:  Diagnosis Date  . AMA (advanced maternal age) multigravida 35+   . Anemia   . Ganglion cyst 01/25/2017   Left ankle  . Kidney stones 2013   With pregnancy   . Language barrier   . Obese   . Obesity 01/25/2017  . Prediabetes 02/17/2016  . Pregnancy induced hypertension    at end of last 2 pregnancies    Patient Active Problem List   Diagnosis Date Noted  . Obesity 01/25/2017  . Ganglion cyst 01/25/2017  . Prediabetes 02/17/2016  . Essential hypertension 08/19/2015  . Kidney stones     Past Surgical History:  Procedure Laterality Date  . NO PAST SURGERIES       OB History    Gravida  3   Para  3   Term  3   Preterm  0   AB  0   Living  3     SAB  0   TAB  0   Ectopic  0   Multiple  0   Live Births  3            Home Medications    Prior to Admission medications   Medication Sig Start Date End Date Taking? Authorizing Provider  betamethasone dipropionate 0.05 % lotion Apply topically 2 (two) times daily. 10/02/18   Claiborne Rigg, NP  methocarbamol (ROBAXIN) 500 MG tablet Take 1 tablet (500  mg total) by mouth 2 (two) times daily. 06/22/18   Muthersbaugh, Dahlia Client, PA-C  Multiple Vitamin (MULTIVITAMIN) tablet Take 1 tablet by mouth daily. Reported on 02/17/2016 05/22/15   Health, [facility] Department Of Public  naproxen (NAPROSYN) 828 MG tablet Take 1 tablet (375 mg total) by mouth 2 (two) times daily. 10/16/18   Wurst, [place], PA-C  omeprazole (PRILOSEC) 20 MG capsule Take 1 capsule (20 mg total) by mouth daily. 07/03/18   Anders Simmonds, PA-C    Family History Family History  Problem Relation Age of Onset  . Diabetes Father   . Hypertension Father     Social History Social History   Tobacco Use  . Smoking status: Never Smoker  . Smokeless tobacco: Never Used  Substance Use Topics  . Alcohol use: No    Alcohol/week: 0.0 standard drinks  . Drug use: No     Allergies   Patient has no known allergies.   Review of Systems Review of Systems  Constitutional: Negative.   HENT: Negative.   Respiratory: Negative.   Cardiovascular: Negative.   Gastrointestinal: Negative.   Musculoskeletal: Negative.   Skin: Negative.   Neurological:  Positive for headaches.  Psychiatric/Behavioral: Negative.   All other systems reviewed and are negative.    Physical Exam Updated Vital Signs BP 130/71 (BP Location: Left Arm)   Pulse 78   Temp 98.4 F (36.9 C) (Oral)   Resp 14   LMP 09/29/2018   SpO2 100%   Physical Exam Vitals signs and nursing note reviewed.  Constitutional:      Appearance: She is well-developed.  HENT:     Head: Normocephalic and atraumatic.     Comments: Soft tissue swelling of scalp.  No open wounds.  Minimally tender at top of head.    Right Ear: Tympanic membrane normal.     Left Ear: Tympanic membrane normal.  Eyes:     Conjunctiva/sclera: Conjunctivae normal.     Pupils: Pupils are equal, round, and reactive to light.  Neck:     Musculoskeletal: Neck supple. No muscular tenderness.     Thyroid: No thyromegaly.     Trachea: No tracheal  deviation.  Cardiovascular:     Rate and Rhythm: Normal rate.     Heart sounds: No murmur.  Pulmonary:     Effort: Pulmonary effort is normal.  Abdominal:     General: There is no distension.  Musculoskeletal: Normal range of motion.  Skin:    General: Skin is warm and dry.  Neurological:     General: No focal deficit present.     Mental Status: She is alert.     Coordination: Coordination normal.     Comments: Gait normal Romberg normal pronator drift normal motor strength 5/5 overall cranial nerves II through XII grossly intact      ED Treatments / Results  Labs (all labs ordered are listed, but only abnormal results are displayed) Labs Reviewed - No data to display  EKG None  Radiology No results found.  Procedures Procedures (including critical care time)  Medications Ordered in ED Medications - No data to display   Initial Impression / Assessment and Plan / ED Course  I have reviewed the triage vital signs and the nursing notes.  Pertinent labs & imaging results that were available during my care of the patient were reviewed by me and considered in my medical decision making (see chart for details).     Imaging not indicated discussed with patient who agrees.  Plan Tylenol for pain.  Follow-up with PMD or work comp doctor as needed.  Head injury instructions given.  Final Clinical Impressions(s) / ED Diagnoses   Final diagnoses:  Minor head injury, initial encounter    ED Discharge Orders    None       Doug SouJacubowitz, Theressa Piedra, MD 10/28/18 1842

## 2018-10-28 NOTE — Discharge Instructions (Addendum)
Take Tylenol as directed for pain.  If you have significant pain in a week, asked to see your workers compensation doctor or return here if concerned for any reason

## 2018-10-28 NOTE — ED Triage Notes (Addendum)
t to ED reports a hammer fell on her head while at work. Pt denies LOC, change in vision, nausea/vomiting, does not take blood thinners. Pt reports pain to top of head 5/10. Pt in no acute distress at this time. No obvious injury noted to head.

## 2018-11-15 ENCOUNTER — Ambulatory Visit: Payer: Self-pay | Attending: Nurse Practitioner

## 2018-11-21 ENCOUNTER — Ambulatory Visit: Payer: Self-pay

## 2018-11-21 ENCOUNTER — Encounter (HOSPITAL_COMMUNITY): Payer: Self-pay

## 2018-11-21 ENCOUNTER — Other Ambulatory Visit (HOSPITAL_COMMUNITY): Payer: Self-pay | Admitting: Obstetrics and Gynecology

## 2018-11-21 ENCOUNTER — Ambulatory Visit (HOSPITAL_COMMUNITY)
Admission: RE | Admit: 2018-11-21 | Discharge: 2018-11-21 | Disposition: A | Discharge status: Home / Self Care | Payer: Self-pay | Source: Ambulatory Visit | Attending: Obstetrics and Gynecology | Admitting: Obstetrics and Gynecology

## 2018-11-21 VITALS — BP 128/74 | Ht 59.0 in | Wt 204.0 lb

## 2018-11-21 DIAGNOSIS — N644 Mastodynia: Secondary | ICD-10-CM

## 2018-11-21 DIAGNOSIS — Z1239 Encounter for other screening for malignant neoplasm of breast: Secondary | ICD-10-CM

## 2018-11-21 NOTE — Progress Notes (Signed)
Complaints of left breast pain that last a week and resolved one week ago.   Pap Smear: Pap smear not completed today. [facility] and normal per patient. Per patient has no history of an abnormal Pap smear. No Pap smear results are in Epic.  Physical exam: Breasts Breasts symmetrical. No skin abnormalities bilateral breasts. No nipple retraction bilateral breasts. No nipple discharge bilateral breasts. No lymphadenopathy. No lumps palpated bilateral breasts. No complaints of pain or tenderness on exam. [facility]. Appointment scheduled for Tuesday, Feb 25, 2019 at 0930.        Pelvic/Bimanual No Pap smear completed today since last Pap smear was in February 2019 per patient. Pap smear not indicated per BCCCP guidelines.   Smoking History: Patient has never smoked.  Patient Navigation: Patient education provided. Access to services provided for patient through [facility] program. Spanish interpreter provided.   Breast and Cervical Cancer Risk Assessment: Patient has no family history of breast cancer, known genetic mutations, or radiation treatment to the chest before age 47. Patient has no history of cervical dysplasia, immunocompromised, or DES exposure in-utero.  Risk Assessment    Risk Scores      11/21/2018   Last edited by: Priscille HeidelbergBrannock, Dj Senteno P, RN   5-year risk: 0.5 %   Lifetime risk: 6 %         Used Spanish interpreter Natale LayErika McReynolds from [facility].

## 2018-11-21 NOTE — Patient Instructions (Signed)
Explained breast self awareness with Uvaldo Rising. Patient did not need a Pap smear today due to last Pap smear was in February 2019 per patient. Let her know BCCCP will cover Pap smears every 3 years unless has a history of abnormal Pap smears. Referred patient to the [facility] for a screening mammogram. Appointment scheduled for Tuesday, Feb 25, 2019 at 0930. Patient aware of appointment and will be there. Let patient know the Breast Center will follow up with her within a couple weeks following appointment with results of mammogram by letter or phone. Uvaldo Rising verbalized understanding.  Patton Swisher, Kathaleen Maser, RN 1:17 PM

## 2018-11-22 ENCOUNTER — Encounter (HOSPITAL_COMMUNITY): Payer: Self-pay | Admitting: *Deleted

## 2019-01-03 ENCOUNTER — Ambulatory Visit: Payer: Self-pay | Admitting: Nurse Practitioner

## 2019-03-26 ENCOUNTER — Other Ambulatory Visit (HOSPITAL_COMMUNITY): Payer: Self-pay | Admitting: Obstetrics and Gynecology

## 2019-03-26 DIAGNOSIS — Z1231 Encounter for screening mammogram for malignant neoplasm of breast: Secondary | ICD-10-CM

## 2019-05-06 ENCOUNTER — Ambulatory Visit
Admission: RE | Admit: 2019-05-06 | Discharge: 2019-05-06 | Disposition: A | Discharge status: Home / Self Care | Payer: No Typology Code available for payment source | Source: Ambulatory Visit | Attending: Obstetrics and Gynecology | Admitting: Obstetrics and Gynecology

## 2019-05-06 ENCOUNTER — Other Ambulatory Visit: Payer: Self-pay

## 2019-05-06 DIAGNOSIS — Z1231 Encounter for screening mammogram for malignant neoplasm of breast: Secondary | ICD-10-CM

## 2019-05-23 MED FILL — BETAMETHASONE DIPROPIONATE: 0.05 | 60 days supply | Qty: 60 | Fill #1

## 2019-05-27 MED FILL — OMEPRAZOLE 20 MG CAP: 20 | 30 days supply | Qty: 30 | Fill #1

## 2019-06-30 ENCOUNTER — Ambulatory Visit: Payer: No Typology Code available for payment source

## 2019-08-09 ENCOUNTER — Encounter (HOSPITAL_COMMUNITY): Payer: Self-pay

## 2019-08-09 ENCOUNTER — Ambulatory Visit (HOSPITAL_COMMUNITY)
Admission: EM | Admit: 2019-08-09 | Discharge: 2019-08-09 | Disposition: A | Discharge status: Home / Self Care | Payer: No Typology Code available for payment source | Attending: Emergency Medicine | Admitting: Emergency Medicine

## 2019-08-09 ENCOUNTER — Other Ambulatory Visit: Payer: Self-pay

## 2019-08-09 DIAGNOSIS — N898 Other specified noninflammatory disorders of vagina: Secondary | ICD-10-CM | POA: Insufficient documentation

## 2019-08-09 DIAGNOSIS — R739 Hyperglycemia, unspecified: Secondary | ICD-10-CM | POA: Insufficient documentation

## 2019-08-09 DIAGNOSIS — N390 Urinary tract infection, site not specified: Secondary | ICD-10-CM | POA: Insufficient documentation

## 2019-08-09 DIAGNOSIS — R319 Hematuria, unspecified: Secondary | ICD-10-CM | POA: Insufficient documentation

## 2019-08-09 LAB — POCT URINALYSIS DIP (DEVICE)
Bilirubin Urine: NEGATIVE
Glucose, UA: 500 mg/dL — AB
Ketones, ur: 80 mg/dL — AB
Nitrite: NEGATIVE
Protein, ur: 100 mg/dL — AB
Specific Gravity, Urine: 1.025 (ref 1.005–1.030)
Urobilinogen, UA: 0.2 mg/dL (ref 0.0–1.0)
pH: 6 (ref 5.0–8.0)

## 2019-08-09 LAB — GLUCOSE, CAPILLARY: Glucose-Capillary: 317 mg/dL — ABNORMAL HIGH (ref 70–99)

## 2019-08-09 MED ORDER — FLUCONAZOLE 150 MG PO TABS: 150.0000 mg | ORAL_TABLET | Freq: Every day | ORAL | 0 refills | Status: DC

## 2019-08-09 MED ORDER — CLOTRIMAZOLE 1 % EX CREA: TOPICAL_CREAM | CUTANEOUS | 0 refills | Status: DC

## 2019-08-09 MED ORDER — CEPHALEXIN 500 MG PO CAPS: 500.0000 mg | ORAL_CAPSULE | Freq: Three times a day (TID) | ORAL | 0 refills | Status: AC

## 2019-08-09 MED ORDER — METFORMIN HCL 500 MG PO TABS: 500.0000 mg | ORAL_TABLET | Freq: Two times a day (BID) | ORAL | 0 refills | Status: DC

## 2019-08-09 NOTE — ED Provider Notes (Signed)
[facility]    CSN: [id number] Arrival date & time: 08/09/19  1019      History   Chief Complaint Chief Complaint  Patient presents with  . Hyperglycemia  . Vaginal Itching    HPI Rebecca Sharp is a 48 y.o. female.   Patient presents with 1 month history of vaginal itching.  She also reports she feels thirsty all the time for the past 2 weeks.  Patient states in the past she has been diagnosed as prediabetic and is concerned that her blood sugar may be high.  She denies fever, chills, abdominal pain, dysuria, back pain, vaginal discharge, pelvic pain, or other symptoms.  LMP: 07/19/2019  HPI  Past Medical History:  Diagnosis Date  . AMA (advanced maternal age) multigravida 55+   . Anemia   . Ganglion cyst 01/25/2017   Left ankle  . Kidney stones 2013   With pregnancy   . Language barrier   . Obese   . Obesity 01/25/2017  . Prediabetes 02/17/2016  . Pregnancy induced hypertension    at end of last 2 pregnancies    Patient Active Problem List   Diagnosis Date Noted  . Obesity 01/25/2017  . Ganglion cyst 01/25/2017  . Prediabetes 02/17/2016  . Essential hypertension 08/19/2015  . Kidney stones     Past Surgical History:  Procedure Laterality Date  . NO PAST SURGERIES      OB History    Gravida  3   Para  3   Term  3   Preterm  0   AB  0   Living  3     SAB  0   TAB  0   Ectopic  0   Multiple  0   Live Births  3            Home Medications    Prior to Admission medications   Medication Sig Start Date End Date Taking? Authorizing Provider  betamethasone dipropionate 0.05 % lotion Apply topically 2 (two) times daily. 10/02/18   Gildardo Pounds, NP  cephALEXin (KEFLEX) 500 MG capsule Take 1 capsule (500 mg total) by mouth 3 (three) times daily for 7 days. 08/09/19 08/16/19  Sharion Balloon, NP  clotrimazole (LOTRIMIN) 1 % cream Apply to affected area 2 times daily 08/09/19   Sharion Balloon, NP  metFORMIN (GLUCOPHAGE) 500  MG tablet Take 1 tablet (500 mg total) by mouth 2 (two) times daily with a meal. 08/09/19   Sharion Balloon, NP  methocarbamol (ROBAXIN) 500 MG tablet Take 1 tablet (500 mg total) by mouth 2 (two) times daily. Patient not taking: Reported on 11/21/2018 06/22/18   Muthersbaugh, Jarrett Soho, PA-C  Multiple Vitamin (MULTIVITAMIN) tablet Take 1 tablet by mouth daily. Reported on 02/17/2016 05/22/15   Health, [facility] Department Of Public  naproxen (NAPROSYN) 375 MG tablet Take 1 tablet (375 mg total) by mouth 2 (two) times daily. Patient not taking: Reported on 11/21/2018 10/16/18   Wurst, [place], PA-C  omeprazole (PRILOSEC) 20 MG capsule Take 1 capsule (20 mg total) by mouth daily. 07/03/18   Argentina Donovan, PA-C    Family History Family History  Problem Relation Age of Onset  . Diabetes Father   . Hypertension Father     Social History Social History   Tobacco Use  . Smoking status: Never Smoker  . Smokeless tobacco: Never Used  Substance Use Topics  . Alcohol use: No    Alcohol/week: 0.0 standard drinks  .  Drug use: No     Allergies   Patient has no known allergies.   Review of Systems Review of Systems  Constitutional: Negative for chills and fever.  HENT: Negative for ear pain and sore throat.   Eyes: Negative for pain and visual disturbance.  Respiratory: Negative for cough and shortness of breath.   Cardiovascular: Negative for chest pain and palpitations.  Gastrointestinal: Negative for abdominal pain, diarrhea and vomiting.  Endocrine: Positive for polydipsia.  Genitourinary: Negative for dysuria, flank pain, hematuria, pelvic pain and vaginal discharge.  Musculoskeletal: Negative for arthralgias and back pain.  Skin: Negative for color change and rash.  Neurological: Negative for dizziness, seizures and syncope.  All other systems reviewed and are negative.    Physical Exam Triage Vital Signs ED Triage Vitals  Enc Vitals Group     BP      Pulse      Resp       Temp      Temp src      SpO2      Weight      Height      Head Circumference      Peak Flow      Pain Score      Pain Loc      Pain Edu?      Excl. in GC?    No data found.  Updated Vital Signs BP (!) 146/88 (BP Location: Right Arm)   Pulse 93   Temp 97.8 F (36.6 C) (Oral)   Resp 16   SpO2 100%   Visual Acuity Right Eye Distance:   Left Eye Distance:   Bilateral Distance:    Right Eye Near:   Left Eye Near:    Bilateral Near:     Physical Exam Vitals signs and nursing note reviewed.  Constitutional:      General: She is not in acute distress.    Appearance: She is well-developed.  HENT:     Head: Normocephalic and atraumatic.     Right Ear: Tympanic membrane normal.     Left Ear: Tympanic membrane normal.     Mouth/Throat:     Mouth: Mucous membranes are moist.     Pharynx: Oropharynx is clear.  Eyes:     Conjunctiva/sclera: Conjunctivae normal.  Neck:     Musculoskeletal: Neck supple.  Cardiovascular:     Rate and Rhythm: Normal rate and regular rhythm.     Heart sounds: No murmur.  Pulmonary:     Effort: Pulmonary effort is normal. No respiratory distress.     Breath sounds: Normal breath sounds.  Abdominal:     General: Bowel sounds are normal.     Palpations: Abdomen is soft.     Tenderness: There is no abdominal tenderness. There is no right CVA tenderness, left CVA tenderness, guarding or rebound.  Skin:    General: Skin is warm and dry.     Findings: No rash.  Neurological:     General: No focal deficit present.     Mental Status: She is alert and oriented to person, place, and time.     Sensory: No sensory deficit.     Motor: No weakness.     Gait: Gait normal.      UC Treatments / Results  Labs (all labs ordered are listed, but only abnormal results are displayed) Labs Reviewed  GLUCOSE, CAPILLARY - Abnormal; Notable for the following components:      Result Value   Glucose-Capillary 317 (*)  All other components within normal  limits  POCT URINALYSIS DIP (DEVICE) - Abnormal; Notable for the following components:   Glucose, UA 500 (*)    Ketones, ur 80 (*)    Hgb urine dipstick MODERATE (*)    Protein, ur 100 (*)    Leukocytes,Ua TRACE (*)    All other components within normal limits  URINE CULTURE  CBG MONITORING, ED  CERVICOVAGINAL ANCILLARY ONLY    EKG   Radiology No results found.  Procedures Procedures (including critical care time)  Medications Ordered in UC Medications - No data to display  Initial Impression / Assessment and Plan / UC Course  I have reviewed the triage vital signs and the nursing notes.  Pertinent labs & imaging results that were available during my care of the patient were reviewed by me and considered in my medical decision making (see chart for details).    Hyperglycemia.  Vaginal itching.  UTI.  Patient is well-appearing.  Treating with Metformin for hyperglycemia.  Treating UTI with Keflex.  Treating vaginal itching with clotrimazole cream.  Instructed patient to call her PCP on Monday morning to schedule an appointment for next week.  Instructed her to return here for a recheck of her blood sugar if she is not able to be seen by her PCP next week.  Discussed diabetic diet with patient.  Discussed signs and symptoms of hyperglycemia and hypoglycemia.  Patient agrees to plan of care.     Final Clinical Impressions(s) / UC Diagnoses   Final diagnoses:  Hyperglycemia  Vaginal itching  Urinary tract infection with hematuria, site unspecified     Discharge Instructions     Take the diabetes medication as directed.    Use the prescribed cream for your vaginal itching.  Take the antibiotic as directed for your urinary tract infection.  Follow-up with your primary care provider next week to discuss your elevated blood sugar and other symptoms.  If you are not able to get an appointment with your primary care provider, return here in 1 week for a recheck of your blood  sugar.        ED Prescriptions    Medication Sig Dispense Auth. Provider   metFORMIN (GLUCOPHAGE) 500 MG tablet Take 1 tablet (500 mg total) by mouth 2 (two) times daily with a meal. 60 tablet Mickie Bail, NP   fluconazole (DIFLUCAN) 150 MG tablet  (Status: Discontinued) Take 1 tablet (150 mg total) by mouth daily. Take one tablet today.  May repeat in 3 days. 2 tablet Mickie Bail, NP   clotrimazole (LOTRIMIN) 1 % cream Apply to affected area 2 times daily 15 g Mickie Bail, NP   cephALEXin (KEFLEX) 500 MG capsule Take 1 capsule (500 mg total) by mouth 3 (three) times daily for 7 days. 21 capsule Mickie Bail, NP     PDMP not reviewed this encounter.   Mickie Bail, NP 08/09/19 [phone]

## 2019-08-09 NOTE — ED Triage Notes (Addendum)
Pt states she is thirsty all the time, her mouth feels dry x 2 weeks.  Pt states she is having vaginal itchy x 1 month on and off. Pt denies any vaginal discharge. Pt states she is using Lagicam for the vaginal itchy. Pt reports her PCP told her 1 year ago, she is pre diabetic.

## 2019-08-09 NOTE — Discharge Instructions (Addendum)
Take the diabetes medication as directed.    Use the prescribed cream for your vaginal itching.  Take the antibiotic as directed for your urinary tract infection.  Follow-up with your primary care provider next week to discuss your elevated blood sugar and other symptoms.  If you are not able to get an appointment with your primary care provider, return here in 1 week for a recheck of your blood sugar.

## 2019-08-11 LAB — URINE CULTURE

## 2019-08-14 LAB — CERVICOVAGINAL ANCILLARY ONLY
Bacterial vaginitis: POSITIVE — AB
Candida vaginitis: POSITIVE — AB
Chlamydia: NEGATIVE
Neisseria Gonorrhea: NEGATIVE
Trichomonas: NEGATIVE

## 2019-08-15 ENCOUNTER — Telehealth (HOSPITAL_COMMUNITY): Payer: Self-pay | Admitting: Emergency Medicine

## 2019-08-15 MED ORDER — FLUCONAZOLE 150 MG PO TABS: 150.0000 mg | ORAL_TABLET | Freq: Once | ORAL | 0 refills | Status: AC

## 2019-08-15 MED ORDER — METRONIDAZOLE 500 MG PO TABS: 500.0000 mg | ORAL_TABLET | Freq: Two times a day (BID) | ORAL | 0 refills | Status: AC

## 2019-08-15 NOTE — Telephone Encounter (Signed)
Bacterial vaginosis is positive. This was not treated at the urgent care visit.  Flagyl 500 mg BID x 7 days #14 no refills sent to patients pharmacy of choice.    Test for candida (yeast) was positive.  Prescription for fluconazole 150mg po now, repeat dose in 3d if needed, #2 no refills, sent to the pharmacy of record.  Recheck or followup with PCP for further evaluation if symptoms are not improving.    Attempted to reach patient. No answer at this time. Voicemail left.    

## 2019-08-19 ENCOUNTER — Telehealth (HOSPITAL_COMMUNITY): Payer: Self-pay | Admitting: Emergency Medicine

## 2019-08-19 NOTE — Telephone Encounter (Signed)
Attempted to reach patient x2. No answer at this time. Number not in service.

## 2019-08-20 ENCOUNTER — Other Ambulatory Visit: Payer: Self-pay | Admitting: Physician Assistant

## 2019-08-20 DIAGNOSIS — R12 Heartburn: Secondary | ICD-10-CM

## 2019-08-20 MED FILL — BETAMETHASONE DP AUG 0.05%: 0.05 | 30 days supply | Qty: 60 | Fill #0

## 2019-08-20 NOTE — Progress Notes (Signed)
Patient ID: Rebecca Sharp, female   DOB: [birth year], 48 y.o.   MRN: [id number]     Rebecca Sharp, is a 48 y.o. female  ZJI:[id number]  [id number]:[id number]  DOB - 1971-08-03  Subjective:  Chief Complaint and HPI: Rebecca Sharp is a 48 y.o. female here today for a follow up visit After being seen at [facility] for vaginal itching and was diagnosed with diabetes.  She is tolerating the metformin ok at 500bid.  She does not have a glucometer. And feels unfamiliar with having diabetes since this is a new diagnosis.  She has vision issues but thought this was related to aging.  Denies other s/sx.    From UC note: Hyperglycemia.  Vaginal itching.  UTI.  Patient is well-appearing.  Treating with Metformin for hyperglycemia.  Treating UTI with Keflex.  Treating vaginal itching with clotrimazole cream.  Instructed patient to call her PCP on Monday morning to schedule an appointment for next week.  Instructed her to return here for a recheck of her blood sugar if she is not able to be seen by her PCP next week.  Discussed diabetic diet with patient.  Discussed signs and symptoms of hyperglycemia and hypoglycemia.  Patient agrees to plan of care.     ED/Hospital notes reviewed and summarized above.   Family history:  Dad-htn and DM  ROS:   Constitutional:  No f/c, No night sweats, No unexplained weight loss. EENT:  No vision changes, + blurry vision, No hearing changes. No mouth, throat, or ear problems.  Respiratory: No cough, No SOB Cardiac: No CP, no palpitations GI:  No abd pain, No N/V/D. GU: No Urinary s/sx Musculoskeletal: No joint pain Neuro: No headache, no dizziness, no motor weakness.  Skin: No rash Endocrine:  No polydipsia. No polyuria.  Psych: Denies SI/HI  No problems updated.  ALLERGIES: No Known Allergies  PAST MEDICAL HISTORY: Past Medical History:  Diagnosis Date  . AMA (advanced maternal age) multigravida 64+   . Anemia   . Ganglion cyst  01/25/2017   Left ankle  . Kidney stones 2013   With pregnancy   . Language barrier   . Obese   . Obesity 01/25/2017  . Prediabetes 02/17/2016  . Pregnancy induced hypertension    at end of last 2 pregnancies    MEDICATIONS AT HOME: Prior to Admission medications   Medication Sig Start Date End Date Taking? Authorizing Provider  metFORMIN (GLUCOPHAGE) 500 MG tablet Take 2 tablets (1,000 mg total) by mouth 2 (two) times daily with a meal. 08/21/19  Yes McClung, Angela M, PA-C  atorvastatin (LIPITOR) 10 MG tablet Take 1 tablet (10 mg total) by mouth daily. 08/21/19   Argentina Donovan, PA-C  betamethasone dipropionate 0.05 % lotion Apply topically 2 (two) times daily. 10/02/18   Gildardo Pounds, NP  Blood Glucose Monitoring Suppl (TRUE METRIX METER) w/Device KIT 1 each by Does not apply route 2 (two) times daily. 08/21/19   Argentina Donovan, PA-C  clotrimazole (LOTRIMIN) 1 % cream Apply to affected area 2 times daily 08/09/19   Sharion Balloon, NP  glucose blood (TRUE METRIX BLOOD GLUCOSE TEST) test strip Use as instructed 08/21/19   Argentina Donovan, PA-C  lisinopril (ZESTRIL) 10 MG tablet Take 1 tablet (10 mg total) by mouth daily. 08/21/19   Argentina Donovan, PA-C  methocarbamol (ROBAXIN) 500 MG tablet Take 1 tablet (500 mg total) by mouth 2 (two) times daily. Patient not taking: Reported on 11/21/2018 06/22/18  Muthersbaugh, Jarrett Soho, PA-C  metroNIDAZOLE (FLAGYL) 500 MG tablet Take 1 tablet (500 mg total) by mouth 2 (two) times daily for 7 days. Patient not taking: Reported on 08/21/2019 08/15/19 08/22/19  Raylene Everts, MD  Multiple Vitamin (MULTIVITAMIN) tablet Take 1 tablet by mouth daily. Reported on 02/17/2016 05/22/15   [facility]  naproxen (NAPROSYN) 375 MG tablet Take 1 tablet (375 mg total) by mouth 2 (two) times daily. Patient not taking: Reported on 11/21/2018 10/16/18   Wurst, [place], PA-C  omeprazole (PRILOSEC) 20 MG capsule Take 1 capsule (20 mg total)  by mouth daily. 07/03/18   Argentina Donovan, PA-C  TRUEplus Lancets 28G MISC 1 each by Does not apply route 2 (two) times daily. 08/21/19   Argentina Donovan, PA-C     Objective:  EXAM:   Vitals:   08/21/19 0949  BP: 140/88  Pulse: 82  Temp: 98.2 F (36.8 C)  TempSrc: Oral  SpO2: 99%  Weight: 186 lb (84.4 kg)  Height: 4' 11"  (1.499 m)    General appearance : A&OX3. NAD. Non-toxic-appearing HEENT: Atraumatic and Normocephalic.  PERRLA. EOM intact.  Neck: supple, no JVD. No cervical lymphadenopathy. No thyromegaly Chest/Lungs:  Breathing-non-labored, Good air entry bilaterally, breath sounds normal without rales, rhonchi, or wheezing  CVS: S1 S2 regular, no murmurs, gallops, rubs  Extremities: Bilateral Lower Ext shows no edema, both legs are warm to touch with = pulse throughout Neurology:  CN II-XII grossly intact, Non focal.   Psych:  TP linear. J/I WNL. Normal speech. Appropriate eye contact and affect.  Skin:  No Rash  Data Review Lab Results  Component Value Date   HGBA1C 10.8 (A) 08/21/2019   HGBA1C 6.1 10/02/2018   HGBA1C 5.9 (H) 04/03/2018     Assessment & Plan   1. Type 2 diabetes mellitus with hyperglycemia, without long-term current use of insulin ([place]) Diabetes education provided.  Increase dose metformin.  Glucose supplies order and education provided.  Check blood sugars fating and in the evening and record and bring to next visit.  Increase water intake.  Eliminate sugars and carbohydrates.   - Glucose (CBG) - HgB A1c - Blood Glucose Monitoring Suppl (TRUE METRIX METER) w/Device KIT; 1 each by Does not apply route 2 (two) times daily.  Dispense: 1 kit; Refill: 0 - glucose blood (TRUE METRIX BLOOD GLUCOSE TEST) test strip; Use as instructed  Dispense: 100 each; Refill: 12 - TRUEplus Lancets 28G MISC; 1 each by Does not apply route 2 (two) times daily.  Dispense: 100 each; Refill: 2 - metFORMIN (GLUCOPHAGE) 500 MG tablet; Take 2 tablets (1,000 mg total) by  mouth 2 (two) times daily with a meal.  Dispense: 120 tablet; Refill: 5 - Comprehensive metabolic panel - Lipid panel - atorvastatin (LIPITOR) 10 MG tablet; Take 1 tablet (10 mg total) by mouth daily.  Dispense: 90 tablet; Refill: 3  2. Encounter for examination following treatment at hospital  3. Newly diagnosed diabetes ([place]) -educated on dyslipidemia that characteristically accompanies DM - atorvastatin (LIPITOR) 10 MG tablet; Take 1 tablet (10 mg total) by mouth daily.  Dispense: 90 tablet; Refill: 3  4. Hypertension, unspecified type Start ACE for kidney protection and better BP control - Comprehensive metabolic panel - lisinopril (ZESTRIL) 10 MG tablet; Take 1 tablet (10 mg total) by mouth daily.  Dispense: 90 tablet; Refill: 3  5.  Language barrier-stratus interpreters used and additional time performing visit was required.   Spent >40 minutes face to  face with patient education, counseling, and answering questions on all of the above.  Robert with [employer] translating.   Patient have been counseled extensively about nutrition and exercise  Return for 3 weeks with Lurena Joiner for diabetes teaching; BP and blood sugar check then 3 months with Zelda.  The patient was given clear instructions to go to ER or return to medical center if symptoms don't improve, worsen or new problems develop. The patient verbalized understanding. The patient was told to call to get lab results if they haven't heard anything in the next week.     Freeman Caldron, PA-C [facility] and [place], [place]   08/21/2019, 10:10 AM

## 2019-08-21 ENCOUNTER — Ambulatory Visit: Payer: Self-pay | Attending: Nurse Practitioner | Admitting: Physician Assistant

## 2019-08-21 ENCOUNTER — Ambulatory Visit: Payer: No Typology Code available for payment source | Admitting: Pharmacist

## 2019-08-21 ENCOUNTER — Other Ambulatory Visit: Payer: Self-pay

## 2019-08-21 VITALS — BP 140/88 | HR 82 | Temp 98.2°F | Ht 59.0 in | Wt 186.0 lb

## 2019-08-21 DIAGNOSIS — Z603 Acculturation difficulty: Secondary | ICD-10-CM

## 2019-08-21 DIAGNOSIS — E119 Type 2 diabetes mellitus without complications: Secondary | ICD-10-CM

## 2019-08-21 DIAGNOSIS — I10 Essential (primary) hypertension: Secondary | ICD-10-CM

## 2019-08-21 DIAGNOSIS — Z09 Encounter for follow-up examination after completed treatment for conditions other than malignant neoplasm: Secondary | ICD-10-CM

## 2019-08-21 DIAGNOSIS — Z7189 Other specified counseling: Secondary | ICD-10-CM

## 2019-08-21 DIAGNOSIS — Z789 Other specified health status: Secondary | ICD-10-CM

## 2019-08-21 DIAGNOSIS — E1165 Type 2 diabetes mellitus with hyperglycemia: Secondary | ICD-10-CM

## 2019-08-21 LAB — POCT GLYCOSYLATED HEMOGLOBIN (HGB A1C): Hemoglobin A1C: 10.8 % — AB (ref 4.0–5.6)

## 2019-08-21 LAB — GLUCOSE, POCT (MANUAL RESULT ENTRY): POC Glucose: 186 mg/dl — AB (ref 70–99)

## 2019-08-21 MED ORDER — ATORVASTATIN CALCIUM 10 MG PO TABS: 10.0000 mg | ORAL_TABLET | Freq: Every day | ORAL | 3 refills | Status: DC

## 2019-08-21 MED ORDER — LISINOPRIL 10 MG PO TABS: 10.0000 mg | ORAL_TABLET | Freq: Every day | ORAL | 3 refills | Status: DC

## 2019-08-21 MED ORDER — TRUEPLUS LANCETS 28G MISC: 1.0000 | Freq: Two times a day (BID) | 2 refills | Status: DC

## 2019-08-21 MED ORDER — TRUE METRIX METER W/DEVICE KIT: 1.0000 | PACK | Freq: Two times a day (BID) | 0 refills | Status: AC

## 2019-08-21 MED ORDER — TRUE METRIX BLOOD GLUCOSE TEST VI STRP: ORAL_STRIP | 12 refills | Status: DC

## 2019-08-21 MED ORDER — METFORMIN HCL 500 MG PO TABS: 1000.0000 mg | ORAL_TABLET | Freq: Two times a day (BID) | ORAL | 5 refills | Status: DC

## 2019-08-21 MED FILL — TRUE METRIX TEST STRIP: 50 days supply | Qty: 100 | Fill #0

## 2019-08-21 MED FILL — !TRUE METRIX BLOOD GLUCOSE: 365 days supply | Qty: 1 | Fill #0

## 2019-08-21 MED FILL — ?METFORMIN HCL 500MG TABLET: 500 | 30 days supply | Qty: 120 | Fill #0

## 2019-08-21 MED FILL — TRUEplus LANCETS 28G MISC: 50 days supply | Qty: 100 | Fill #0

## 2019-08-21 MED FILL — LISINOPRIL 10 MG TABS: 10 | 30 days supply | Qty: 30 | Fill #0

## 2019-08-21 MED FILL — ?ATORVASTATIN 10 MG TABLET: 10 | 30 days supply | Qty: 30 | Fill #0

## 2019-08-21 NOTE — Progress Notes (Signed)
Patient was educated on the use of the True Metrix blood glucose meter. Reviewed necessary supplies and operation of the meter. Also reviewed goal blood glucose levels. Reviewed dietary recommendations to aid in glycemic control, including MyPlate. Patient was able to demonstrate use of the meter. All questions and concerns were addressed.

## 2019-08-21 NOTE — Patient Instructions (Addendum)
Check you blood sugars fasting and at bedtime and record and bring to next visit.  Drink 80-100 ounces water daily. Decrease you sugar intake as well as white carbohydrates.   Diabetes mellitus y [place] fsica Diabetes Mellitus and Exercise Hacer actividad fsica habitualmente es importante para el estado de salud general, en especial si tiene diabetes (diabetes mellitus). La actividad fsica no solo se reduce a Pharmacist, hospital. Aporta muchos beneficios para la salud, como aumento de la fuerza muscular y la densidad sea, y reduccin de las grasas corporales y Dealer. Esto mejora el estado fsico, la flexibilidad y la resistencia, y todo ello redunda en un mejor estado de salud general. La actividad fsica tiene beneficios adicionales para los diabticos, entre ellos:  Disminuye el apetito.  Ayuda a bajar y Consulting civil engineer glucemia bajo control.  [place] la presin arterial.  Ayuda a controlar las cantidades de sustancias grasas (lpidos) en la [place], como el colesterol y los triglicridos.  Mejora la respuesta del cuerpo a la insulina (optimizacin de la sensibilidad a la insulina).  Reduce la cantidad de insulina que el cuerpo necesita.  Reduce el riesgo de sufrir cardiopata coronaria de la siguiente forma: ? [employer] de colesterol y triglicridos. ? Aumenta los niveles de colesterol bueno. ? Disminuye la glucemia. Cul es mi plan de Kandace Blitz? El mdico o un educador para la diabetes certificado pueden ayudarlo a Engineer, petroleum del tipo y de la frecuencia de actividad fsica (plan de actividades) adecuado para usted. Asegrese de lo siguiente:  Haga por lo menos 173minutos semanales de ejercicios de intensidad moderada o vigorosa. Estos podran ser caminatas dinmicas, ciclismo o [place]. ? Haga ejercicios de elongacin y de fortalecimiento, como yoga o levantamiento de pesas, por lo menos 2veces por semana. ? Reparta la actividad en al menos 3das de la  semana.  Haga algn tipo de actividad fsica [employer]. ? No deje pasar ms de 2das seguidos sin hacer algn tipo de actividad fsica. ? Evite permanecer inactivo durante ms de 22minutos seguidos. Tmese descansos frecuentes para caminar o estirarse.  Elija un tipo de ejercicio o de actividad que disfrute y establezca objetivos realistas.  Comience lentamente y aumente de [place] gradual la intensidad del ejercicio con el correr del [place]. Qu debo saber acerca del control de la diabetes?   Contrlese la glucemia antes y despus de ejercitarse. ? Si la glucemia es de 240mg /dl (13,3mmol/l) o ms antes de comenzar a hacer actividad fsica, controle la orina para detectar la presencia de cetonas. Si tiene [employer] orina, no haga ejercicio hasta que la glucemia se normalice. ? Si la glucemia es de 100 mg/dl (5.6 mmol/l) o menos, tome una colacin que [employer] 15 y 20 gramos de carbohidratos. Controle la glucemia 15 minutos despus de la colacin para asegurarse de que el nivel est por encima de 100 mg/dl (5.6 mmol/l) antes de comenzar a hacer actividad fsica.  Conozca los sntomas de la glucemia baja (hipoglucemia) y aprenda cmo tratarla. El riesgo de tener hipoglucemia [place] durante y despus de hacer actividad fsica. Los sntomas frecuentes de hipoglucemia pueden incluir los siguientes: ? Hambre. ? Ansiedad. ? Sudoracin y [employer] Corporation. ? Confusin. ? Mareos o sensacin de desvanecimiento. ? Aumento de la frecuencia cardaca o palpitaciones. ? Visin borrosa. ? Hormigueo o adormecimiento alrededor Exxon [employer]il Corporation, los labios o la [place]. ? Estremecimientos y temblores. ? Irritabilidad.  Tenga una colacin de carbohidratos de accin rpida disponible antes, durante  y despus de ejercitarse, a fin de evitar o tratar la hipoglucemia.  Evite inyectarse insulina en las zonas del cuerpo que ejercitar. Por ejemplo, evite inyectarse insulina en: ? Los brazos, si juega al  tenis. ? Las piernas, si corre.  Lleve registros de sus hbitos de actividad fsica. Esto puede ayudarlos a usted y al mdico a Retail banker de control de la diabetes segn sea necesario. Escriba los siguientes datos: ? Los alimentos que consume antes y despus de Radio producer actividad fsica. ? Los niveles de glucosa en la sangre antes y despus de hacer ejericios. ? El tipo y cantidad de [place] fsica que Biomedical engineer. ? Cuando se prev que la insulina alcance su valor mximo, si [place] insulina. No haga actividad fsica en los momentos en que insulina alcanza su valor mximo.  Cuando comience un ejercicio o una actividad nuevos, trabaje con el mdico para asegurarse de que la actividad sea segura para usted y para Academic librarian la [place], los medicamentos o la ingesta de alimentos segn sea necesario.  Beba gran cantidad de agua mientras hace ejercicio para evitar la deshidratacin o los golpes de Airline pilot. Beba suficiente lquido como para mantener la orina clara o de color amarillo plido. Resumen  Hacer actividad fsica habitualmente es importante para el estado de salud general, en especial si tiene diabetes (diabetes mellitus).  La actividad fsica aporta muchos beneficios para la salud, como aumentar la fuerza muscular y la densidad sea, y reducir las grasas corporales y Development worker, community.  El mdico o un educador para la diabetes certificado pueden ayudarlo a Teacher, English as a foreign language del tipo y de la frecuencia de actividad fsica (plan de actividades) adecuado para usted.  Cuando comience un ejercicio o una actividad nuevos, trabaje con el mdico para asegurarse de que la actividad sea segura para usted y para Academic librarian la [place], los medicamentos o la ingesta de alimentos segn sea necesario. Esta informacin no tiene Theme park manager el consejo del mdico. Asegrese de hacerle al mdico cualquier pregunta que tenga. Document Released: 10/22/2007 Document Revised: 07/30/2017 Document Reviewed:  03/13/2016 Elsevier Patient Education  2020 Elsevier Inc.  Informacin bsica sobre la diabetes Diabetes Basics  La diabetes (diabetes mellitus) es una enfermedad de larga duracin (crnica). Se produce cuando el cuerpo no utiliza Artist (glucosa) que se libera de los alimentos despus de comer. La diabetes puede deberse a uno de [employer] Brands o a ambos:  El pncreas no produce suficiente cantidad de una hormona llamada insulina.  El cuerpo no reacciona de forma normal a la insulina que produce. La insulina permite que ciertos azcares (glucosa) ingresen a las clulas del cuerpo. Esto le proporciona energa. Si tiene diabetes, los azcares no pueden ingresar a las clulas. Esto produce un aumento del nivel de Banker (hiperglucemia). Sigue estas instrucciones en tu casa: Cmo se trata la diabetes? Es posible que tenga que administrarse insulina u otros medicamentos para la diabetes todos los [place] para mantener el nivel de International aid/development worker en la sangre equilibrado. Adminstrese los medicamentos para la diabetes todos los [place] se lo haya indicado el mdico. Haga una lista de los medicamentos para la diabetes aqu: Medicamentos para la diabetes  Nombre del medicamento: ______________________________ ? Cantidad (dosis): ________________ Mammie Russian (a.m./p.m.): _______________ Cleon Dew: ___________________________________  Josephine Igo medicamento: ______________________________ ? Cantidad (dosis): ________________ Mammie Russian (a.m./p.m.): _______________ Cleon Dew: ___________________________________  Josephine Igo medicamento: ______________________________ ? Cantidad (dosis): ________________ Mammie Russian (a.m./p.m.): _______________ Cleon Dew: ___________________________________ Si [place] insulina, aprender cmo aplicrsela con inyecciones. Es posible  que deba ajustar la cantidad en funcin de los alimentos que coma. Haga una lista de los tipos de Centennialinsulina que [place] aqu: Insulina  Tipo de  insulina: ______________________________ ? Cantidad (dosis): ________________ Mammie RussianHora (a.m./p.m.): _______________ Cleon DewNotas: ___________________________________  Levander Campionipo de insulina: ______________________________ ? Cantidad (dosis): ________________ Mammie RussianHora (a.m./p.m.): _______________ Cleon DewNotas: ___________________________________  Levander Campionipo de insulina: ______________________________ ? Cantidad (dosis): ________________ Mammie RussianHora (a.m./p.m.): _______________ Cleon DewNotas: ___________________________________  Levander Campionipo de insulina: ______________________________ ? Cantidad (dosis): ________________ Mammie RussianHora (a.m./p.m.): _______________ Cleon DewNotas: ___________________________________  Levander Campionipo de insulina: ______________________________ ? Cantidad (dosis): ________________ Mammie RussianHora (a.m./p.m.): _______________ Cleon DewNotas: ___________________________________ Cmo me controlo el nivel de azcar en la sangre?  Controle sus niveles de azcar en la sangre con un medidor de glucemia segn las indicaciones del mdico. El mdico fijar los objetivos del tratamiento para usted. Generalmente, los resultados de los niveles de azcar en la sangre deben ser los siguientes:  Antes de las comidas (preprandial): de 80 a 130mg /dl (de 4,4 a 1,6XWRU/E7,2mmol/l).  Despus de las comidas (posprandial): por debajo de 180mg /dl (45WUJW/J10mmol/l).  Nivel de A1c: menos del 7%. Anote las veces que se controlar los niveles de azcar en la sangre: Controles de azcar en la sangre  Hora: _______________ Cleon DewNotas: ___________________________________  Mammie RussianHora: _______________ Notas: ___________________________________  Hora: _______________ Notas: ___________________________________  Hora: _______________ Notas: ___________________________________  Hora: _______________ Notas: ___________________________________  Hora: _______________ Notas: ___________________________________  Ladell HeadsQu debo saber acerca del nivel bajo de azcar en la sangre? Un nivel bajo de azcar en la  sangre se denomina hipoglucemia. Este cuadro ocurre cuando el nivel de International aid/development workerazcar en la sangre es igual o menor que 70mg /dl (1,9JYNW/G3,9mmol/l). Entre los sntomas, se pueden incluir los siguientes:  Sentir: ? [place]re. ? Preocupacin o nervios (ansiedad). ? Sudoracin y Alcoa Incpiel hmeda. ? Confusin. ? Mareos. ? Somnolencia. ? Ganas de vomitar (nuseas).  Tener: ? Latidos cardacos acelerados. ? Dolor de Turkmenistancabeza. ? Cambios en la visin. ? Hormigueo y falta de sensibilidad (entumecimiento) alrededor de la boca, los labios o la [place]. ? Movimientos espasmdicos que no puede controlar (convulsiones).  Dificultades para hacer lo siguiente: ? Moverse (coordinacin). ? Dormir. ? Desmayos. ? Molestarse con facilidad (irritabilidad). Tratamiento del nivel bajo de azcar en la sangre Para tratar un nivel bajo de azcar en la sangre, ingiera un alimento o una bebida azucarada de inmediato. Si puede pensar con claridad y tragar de manera segura, siga la regla 15/15, que consiste en lo siguiente:  Consuma 15gramos de un hidrato de carbono de accin rpida (carbohidrato). Hable con su mdico acerca de cunto debera consumir.  Algunos hidratos de carbono de accin rpida son: ? Comprimidos de azcar (pastillas de glucosa). Consuma 3o 4pastillas de glucosa. ? De 6 a 8unidades de caramelos duros. ? De 4 a 6onzas (de 120 a 150ml) de jugo de frutas. ? De 4 a 6onzas (de 120 a 150ml) de refresco comn (no diettico). ? 1 cucharada (15ml) de miel o azcar.  Contrlese el nivel de azcar en la sangre 15minutos despus de ingerir el hidrato de carbono.  Si el nivel de azcar en la sangre todava es igual o menor que 70mg /dl (9,5AOZH/Y3,9mmol/l), ingiera nuevamente 15gramos de un hidrato de carbono.  Si el nivel de azcar en la sangre no supera los 70mg /dl (8,6VHQI/O3,9mmol/l) despus de 3intentos, solicite ayuda de inmediato.  Ingiera una comida o una colacin en el transcurso de 1hora despus de que el nivel de  azcar en la sangre se haya normalizado. Tratamiento del nivel muy bajo de azcar en la sangre Si el nivel de azcar en la  sangre es igual o menor que 54mg /dl (58mmol/l), significa que est muy bajo (hipoglucemia grave). Esto es 1m. No espere a ver si los sntomas desaparecen. Solicite atencin mdica de inmediato. Comunquese con el servicio de emergencias de su localidad (911 en los [place]). No conduzca por sus propios medios Radio broadcast assistant hospital. Preguntas para hacerle al mdico  Es necesario que me rena con [employer] en el cuidado de la diabetes?  Qu equipos necesitar para cuidarme en casa?  Qu medicamentos para la diabetes necesito? Cundo debo tomarlos?  Con qu frecuencia debo controlar mi nivel de azcar en la sangre?  A qu nmero puedo llamar si tengo preguntas?  Cundo es mi prxima cita con el mdico?  Dnde puedo encontrar un grupo de apoyo para las personas con diabetes? Dnde buscar ms informacin  American Diabetes Association (Asociacin Estadounidense de la Diabetes): www.diabetes.org  American Association of Diabetes Educators (Asociacin Estadounidense de Instructores para el Cuidado de la Diabetes): www.diabeteseducator.org/patient-resources Comunquese con un mdico si:  El nivel de azcar en la sangre es igual o mayor que 240mg /dl (IT trainer) durante 2das seguidos.  Ha estado enfermo o ha tenido fiebre durante 2das o ms y no mejora.  Tiene alguno de estos problemas durante ms de 6horas: ? No puede comer ni beber. ? Siente malestar estomacal (nuseas). ? Vomita. ? Presenta heces lquidas (diarrea). Solicite ayuda inmediatamente si:  El nivel de azcar en la sangre est por debajo de 54mg /dl (3mmol/l).  Se siente confundida.  Tiene dificultad para hacer lo siguiente: ? Pensar con claridad. ? La respiracin. Resumen  La diabetes (diabetes mellitus) es una enfermedad de larga duracin (crnica). Se produce  cuando el cuerpo no utiliza 73,5HGDJ/ME (glucosa) que se libera de los alimentos despus de la digestin.  Aplquese la insulina y tome los medicamentos para la diabetes como se lo hayan indicado.  Contrlese el nivel de azcar en la sangre todos los [place], con la frecuencia que le hayan indicado.  Concurra a todas las visitas de 1m se lo haya indicado el mdico. Esto es importante. Esta informacin no tiene Artist el consejo del mdico. Asegrese de hacerle al mdico cualquier pregunta que tenga. Document Released: 02/08/2018 Document Revised: 11/27/2018 Document Reviewed: 02/08/2018 Elsevier Patient Education  2020 02/10/2018.

## 2019-08-22 LAB — COMPREHENSIVE METABOLIC PANEL
ALT: 64 IU/L — ABNORMAL HIGH (ref 0–32)
AST: 90 IU/L — ABNORMAL HIGH (ref 0–40)
Albumin/Globulin Ratio: 1.2 (ref 1.2–2.2)
Albumin: 4.2 g/dL (ref 3.8–4.8)
Alkaline Phosphatase: 84 IU/L (ref 39–117)
BUN/Creatinine Ratio: 12 (ref 9–23)
BUN: 8 mg/dL (ref 6–24)
Bilirubin Total: 0.4 mg/dL (ref 0.0–1.2)
CO2: 18 mmol/L — ABNORMAL LOW (ref 20–29)
Calcium: 8.9 mg/dL (ref 8.7–10.2)
Chloride: 102 mmol/L (ref 96–106)
Creatinine, Ser: 0.67 mg/dL (ref 0.57–1.00)
GFR calc Af Amer: 121 mL/min/{1.73_m2} (ref >59)
GFR calc non Af Amer: 105 mL/min/{1.73_m2} (ref >59)
Globulin, Total: 3.6 g/dL (ref 1.5–4.5)
Glucose: 169 mg/dL — ABNORMAL HIGH (ref 65–99)
Potassium: 4.2 mmol/L (ref 3.5–5.2)
Sodium: 137 mmol/L (ref 134–144)
Total Protein: 7.8 g/dL (ref 6.0–8.5)

## 2019-08-22 LAB — LIPID PANEL
Chol/HDL Ratio: 4.4 ratio (ref 0.0–4.4)
Cholesterol, Total: 191 mg/dL (ref 100–199)
HDL: 43 mg/dL (ref >39)
LDL Chol Calc (NIH): 110 mg/dL — ABNORMAL HIGH (ref 0–99)
Triglycerides: 220 mg/dL — ABNORMAL HIGH (ref 0–149)
VLDL Cholesterol Cal: 38 mg/dL (ref 5–40)

## 2019-09-18 ENCOUNTER — Ambulatory Visit: Payer: No Typology Code available for payment source | Admitting: Pharmacist

## 2019-09-18 MED FILL — LISINOPRIL 10 MG TABS: 10 | 30 days supply | Qty: 30 | Fill #1

## 2019-09-26 ENCOUNTER — Other Ambulatory Visit: Payer: Self-pay

## 2019-09-26 ENCOUNTER — Ambulatory Visit: Payer: Self-pay | Attending: Nurse Practitioner | Admitting: Nurse Practitioner

## 2019-09-26 ENCOUNTER — Ambulatory Visit: Payer: No Typology Code available for payment source | Admitting: Pharmacist

## 2019-09-26 ENCOUNTER — Encounter: Payer: Self-pay | Admitting: Nurse Practitioner

## 2019-09-26 VITALS — BP 124/79 | HR 81 | Resp 17 | Wt 189.0 lb

## 2019-09-26 DIAGNOSIS — Z130 Encounter for screening for diseases of the blood and blood-forming organs and certain disorders involving the immune mechanism: Secondary | ICD-10-CM

## 2019-09-26 DIAGNOSIS — R7989 Other specified abnormal findings of blood chemistry: Secondary | ICD-10-CM

## 2019-09-26 DIAGNOSIS — Z0000 Encounter for general adult medical examination without abnormal findings: Secondary | ICD-10-CM

## 2019-09-26 NOTE — Progress Notes (Signed)
Assessment & Plan:  Rebecca Sharp was seen today for annual exam.  Diagnoses and all orders for this visit:  Encounter for annual physical exam  Screening for deficiency anemia -     CBC  Elevated LFTs -     CMP14+EGFR    Patient has been counseled on age-appropriate routine health concerns for screening and prevention. These are reviewed and up-to-date. Referrals have been placed accordingly. Immunizations are up-to-date or declined.    Subjective:   Chief Complaint  Patient presents with  . Annual Exam    patient is fasting   HPI Rebecca Sharp 48 y.o. female presents to office today for physical exam.  VRI was used to communicate directly with patient for the entire encounter including providing detailed patient instructions.   DM TYPE 2 Doing well on metformin 1000 mg BID. Weight is trending down. She has a log with her today with average fasting readings 100-120s. She started exercising and is having some symptoms of hypoglycemia. I discussed eating 15-30 gram carb snack before exercise to help prevent symptoms.   Lab Results  Component Value Date   HGBA1C 10.8 (A) 08/21/2019   Review of Systems  Constitutional: Negative for fever, malaise/fatigue and weight loss.  HENT: Negative.  Negative for nosebleeds.   Eyes: Negative.  Negative for blurred vision, double vision and photophobia.  Respiratory: Negative.  Negative for cough and shortness of breath.   Cardiovascular: Negative.  Negative for chest pain, palpitations and leg swelling.  Gastrointestinal: Negative.  Negative for heartburn, nausea and vomiting.  Genitourinary: Negative.   Musculoskeletal: Negative.  Negative for myalgias.  Skin: Negative.   Neurological: Negative.  Negative for dizziness, focal weakness, seizures and headaches.  Endo/Heme/Allergies: Negative.   Psychiatric/Behavioral: Negative.  Negative for suicidal ideas.    Past Medical History:  Diagnosis Date  . AMA (advanced maternal age)  multigravida 1+   . Anemia   . Ganglion cyst 01/25/2017   Left ankle  . Kidney stones 2013   With pregnancy   . Language barrier   . Obese   . Obesity 01/25/2017  . Prediabetes 02/17/2016  . Pregnancy induced hypertension    at end of last 2 pregnancies    Past Surgical History:  Procedure Laterality Date  . NO PAST SURGERIES      Family History  Problem Relation Age of Onset  . Diabetes Father   . Hypertension Father     Social History Reviewed with no changes to be made today.   Outpatient Medications Prior to Visit  Medication Sig Dispense Refill  . atorvastatin (LIPITOR) 10 MG tablet Take 1 tablet (10 mg total) by mouth daily. 90 tablet 3  . Blood Glucose Monitoring Suppl (TRUE METRIX METER) w/Device KIT 1 each by Does not apply route 2 (two) times daily. 1 kit 0  . glucose blood (TRUE METRIX BLOOD GLUCOSE TEST) test strip Use as instructed 100 each 12  . lisinopril (ZESTRIL) 10 MG tablet Take 1 tablet (10 mg total) by mouth daily. 90 tablet 3  . metFORMIN (GLUCOPHAGE) 500 MG tablet Take 2 tablets (1,000 mg total) by mouth 2 (two) times daily with a meal. 120 tablet 5  . Multiple Vitamin (MULTIVITAMIN) tablet Take 1 tablet by mouth daily. Reported on 02/17/2016    . TRUEplus Lancets 28G MISC 1 each by Does not apply route 2 (two) times daily. 100 each 2  . betamethasone dipropionate 0.05 % lotion Apply topically 2 (two) times daily. 60 mL 2  .  clotrimazole (LOTRIMIN) 1 % cream Apply to affected area 2 times daily 15 g 0  . methocarbamol (ROBAXIN) 500 MG tablet Take 1 tablet (500 mg total) by mouth 2 (two) times daily. (Patient not taking: Reported on 11/21/2018) 20 tablet 0  . naproxen (NAPROSYN) 375 MG tablet Take 1 tablet (375 mg total) by mouth 2 (two) times daily. (Patient not taking: Reported on 11/21/2018) 20 tablet 0  . omeprazole (PRILOSEC) 20 MG capsule Take 1 capsule (20 mg total) by mouth daily. 30 capsule 3   No facility-administered medications prior to visit.     No Known Allergies     Objective:    BP 124/79   Pulse 81   Resp 17   Wt 189 lb (85.7 kg)   LMP 09/20/2019 Comment: irregular periods  SpO2 96%   BMI 38.17 kg/m  Wt Readings from Last 3 Encounters:  09/26/19 189 lb (85.7 kg)  08/21/19 186 lb (84.4 kg)  11/21/18 204 lb (92.5 kg)    Physical Exam Constitutional:      Appearance: She is well-developed.  HENT:     Head: Normocephalic and atraumatic.     Right Ear: Hearing, tympanic membrane, ear canal and external ear normal.     Left Ear: Hearing, tympanic membrane, ear canal and external ear normal.     Nose: Nose normal.     Mouth/Throat:     Mouth: Mucous membranes are moist.     Dentition: Has dentures.     Tongue: No lesions.     Palate: No mass.     Pharynx: Uvula midline. No oropharyngeal exudate.  Eyes:     General: Lids are normal. No scleral icterus.       Right eye: No discharge.     Extraocular Movements: Extraocular movements intact.     Conjunctiva/sclera: Conjunctivae normal.     Pupils: Pupils are equal, round, and reactive to light.  Neck:     Thyroid: No thyromegaly.     Trachea: No tracheal deviation.  Cardiovascular:     Rate and Rhythm: Normal rate and regular rhythm.     Pulses:          Dorsalis pedis pulses are 2+ on the right side and 2+ on the left side.       Posterior tibial pulses are 2+ on the right side and 2+ on the left side.     Heart sounds: Normal heart sounds. No murmur. No friction rub.  Pulmonary:     Effort: Pulmonary effort is normal. No accessory muscle usage or respiratory distress.     Breath sounds: Normal breath sounds. No decreased breath sounds, wheezing, rhonchi or rales.  Chest:     Chest wall: No tenderness.     Breasts: Breasts are symmetrical.        Right: No inverted nipple, mass, nipple discharge, skin change or tenderness.        Left: No inverted nipple, mass, nipple discharge, skin change or tenderness.  Abdominal:     General: Bowel sounds are  normal. There is no distension.     Palpations: Abdomen is soft. There is no mass.     Tenderness: There is no abdominal tenderness. There is no guarding or rebound.  Musculoskeletal:        General: No tenderness or deformity. Normal range of motion.     Cervical back: Normal range of motion and neck supple.  Lymphadenopathy:     Cervical: No cervical adenopathy.  Skin:  General: Skin is warm and dry.     Findings: No erythema.     Comments: Generalized vitiligo present   Neurological:     Mental Status: She is alert and oriented to person, place, and time.     Cranial Nerves: No cranial nerve deficit.     Sensory: Sensation is intact.     Motor: Motor function is intact.     Coordination: Coordination is intact. Coordination normal.     Gait: Gait is intact.     Deep Tendon Reflexes:     Reflex Scores:      Patellar reflexes are 1+ on the right side and 1+ on the left side. Psychiatric:        Speech: Speech normal.        Behavior: Behavior normal.        Thought Content: Thought content normal.        Judgment: Judgment normal.          Patient has been counseled extensively about nutrition and exercise as well as the importance of adherence with medications and regular follow-up. The patient was given clear instructions to go to ER or return to medical center if symptoms don't improve, worsen or new problems develop. The patient verbalized understanding.   Follow-up: Return in about 9 weeks (around 11/28/2019).   Gildardo Pounds, FNP-BC [facility], [place]   09/26/2019, 11:27 AM

## 2019-09-26 NOTE — Patient Instructions (Signed)
Mantenimiento de la salud en las mujeres Health Maintenance, Female Adoptar un estilo de vida saludable y recibir atencin preventiva son importantes para promover la salud y el bienestar. Consulte al mdico sobre:  El esquema adecuado para hacerse pruebas y exmenes peridicos.  Cosas que puede hacer por su cuenta para prevenir enfermedades y mantenerse sana. Qu debo saber sobre la dieta, el peso y el ejercicio? Consuma una dieta saludable   Consuma una dieta que incluya muchas verduras, frutas, productos lcteos con bajo contenido de grasa y protenas magras.  No consuma muchos alimentos ricos en grasas slidas, azcares agregados o sodio. Mantenga un peso saludable El ndice de masa muscular (IMC) se utiliza para identificar problemas de peso. Proporciona una estimacin de la grasa corporal basndose en el peso y la altura. Su mdico puede ayudarle a determinar su IMC y a lograr o mantener un peso saludable. Haga ejercicio con regularidad Haga ejercicio con regularidad. Esta es una de las prcticas ms importantes que puede hacer por su salud. La mayora de los adultos deben seguir estas pautas:  Realizar, al menos, 150minutos de actividad fsica por semana. El ejercicio debe aumentar la frecuencia cardaca y hacerlo transpirar (ejercicio de intensidad moderada).  Hacer ejercicios de fortalecimiento por lo menos dos veces por semana. Agregue esto a su plan de ejercicio de intensidad moderada.  Pasar menos tiempo sentados. Incluso la actividad fsica ligera puede ser beneficiosa. Controle sus niveles de colesterol y lpidos en la sangre Comience a realizarse anlisis de lpidos y colesterol en la sangre a los 20aos y luego reptalos cada 5aos. Hgase controlar los niveles de colesterol con mayor frecuencia si:  Sus niveles de lpidos y colesterol son altos.  Es mayor de 40aos.  Presenta un alto riesgo de padecer enfermedades cardacas. Qu debo saber sobre las pruebas de  deteccin del cncer? Segn su historia clnica y sus antecedentes familiares, es posible que deba realizarse pruebas de deteccin del cncer en diferentes edades. Esto puede incluir pruebas de deteccin de lo siguiente:  Cncer de mama.  Cncer de cuello uterino.  Cncer colorrectal.  Cncer de piel.  Cncer de pulmn. Qu debo saber sobre la enfermedad cardaca, la diabetes y la hipertensin arterial? Presin arterial y enfermedad cardaca  La hipertensin arterial causa enfermedades cardacas y aumenta el riesgo de accidente cerebrovascular. Es ms probable que esto se manifieste en las personas que tienen lecturas de presin arterial alta, tienen ascendencia africana o tienen sobrepeso.  Hgase controlar la presin arterial: ? Cada 3 a 5 aos si tiene entre 18 y 39 aos. ? Todos los aos si es mayor de 40aos. Diabetes Realcese exmenes de deteccin de la diabetes con regularidad. Este anlisis revisa el nivel de azcar en la sangre en ayunas. Hgase las pruebas de deteccin:  Cada tresaos despus de los 40aos de edad si tiene un peso normal y un bajo riesgo de padecer diabetes.  Con ms frecuencia y a partir de una edad inferior si tiene sobrepeso o un alto riesgo de padecer diabetes. Qu debo saber sobre la prevencin de infecciones? Hepatitis B Si tiene un riesgo ms alto de contraer hepatitis B, debe someterse a un examen de deteccin de este virus. Hable con el mdico para averiguar si tiene riesgo de contraer la infeccin por hepatitis B. Hepatitis C Se recomienda el anlisis a:  Todos los que nacieron entre 1945 y 1965.  Todas las personas que tengan un riesgo de haber contrado hepatitis C. Enfermedades de transmisin sexual (ETS)  Hgase las   pruebas de deteccin de ITS, incluidas la gonorrea y la clamidia, si: ? Es sexualmente activa y es menor de 24aos. ? Es mayor de 24aos, y el mdico le informa que corre riesgo de tener este tipo de infecciones. ?  La actividad sexual ha cambiado desde que le hicieron la ltima prueba de deteccin y tiene un riesgo mayor de tener clamidia o gonorrea. Pregntele al mdico si usted tiene riesgo.  Pregntele al mdico si usted tiene un alto riesgo de contraer VIH. El mdico tambin puede recomendarle un medicamento recetado para ayudar a evitar la infeccin por el VIH. Si elige tomar medicamentos para prevenir el VIH, primero debe hacerse los anlisis de deteccin del VIH. Luego debe hacerse anlisis cada 3meses mientras est tomando los medicamentos. Embarazo  Si est por dejar de menstruar (fase premenopusica) y usted puede quedar embarazada, busque asesoramiento antes de quedar embarazada.  Tome de 400 a 800microgramos (mcg) de cido flico todos los das si queda embarazada.  Pida mtodos de control de la natalidad (anticonceptivos) si desea evitar un embarazo no deseado. Osteoporosis y menopausia La osteoporosis es una enfermedad en la que los huesos pierden los minerales y la fuerza por el avance de la edad. El resultado pueden ser fracturas en los huesos. Si tiene 65aos o ms, o si est en riesgo de sufrir osteoporosis y fracturas, pregunte a su mdico si debe:  Hacerse pruebas de deteccin de prdida sea.  Tomar un suplemento de calcio o de vitamina D para reducir el riesgo de fracturas.  Recibir terapia de reemplazo hormonal (TRH) para tratar los sntomas de la menopausia. Siga estas instrucciones en su casa: Estilo de vida  No consuma ningn producto que contenga nicotina o tabaco, como cigarrillos, cigarrillos electrnicos y tabaco de mascar. Si necesita ayuda para dejar de fumar, consulte al mdico.  No consuma drogas.  No comparta agujas.  Solicite ayuda a su mdico si necesita apoyo o informacin para abandonar las drogas. Consumo de alcohol  No beba alcohol si: ? Su mdico le indica no hacerlo. ? Est embarazada, puede estar embarazada o est tratando de quedar embarazada.   Si bebe alcohol: ? Limite la cantidad que consume de 0 a 1 medida por da. ? Limite la ingesta si est amamantando.  Est atento a la cantidad de alcohol que hay en las bebidas que toma. En [place], una medida equivale a una botella de cerveza de 12oz (355ml), un vaso de vino de 5oz (148ml) o un vaso de una bebida alcohlica de alta graduacin de 1oz (44ml). Instrucciones generales  Realcese los estudios de rutina de la salud, dentales y de la vista.  Mantngase al da con las vacunas.  Infrmele a su mdico si: ? Se siente deprimida con frecuencia. ? Alguna vez ha sido vctima de maltrato o no se siente segura en su casa. Resumen  Adoptar un estilo de vida saludable y recibir atencin preventiva son importantes para promover la salud y el bienestar.  Siga las instrucciones del mdico acerca de una dieta saludable, el ejercicio y la realizacin de pruebas o exmenes para detectar enfermedades.  Siga las instrucciones del mdico con respecto al control del colesterol y la presin arterial. Esta informacin no tiene como fin reemplazar el consejo del mdico. Asegrese de hacerle al mdico cualquier pregunta que tenga. Document Released: 09/21/2011 Document Revised: 10/23/2018 Document Reviewed: 10/23/2018 Elsevier Patient Education  2020 [employer] Inc.  

## 2019-09-27 ENCOUNTER — Other Ambulatory Visit: Payer: Self-pay | Admitting: Nurse Practitioner

## 2019-09-27 LAB — CMP14+EGFR
ALT: 32 IU/L (ref 0–32)
AST: 44 IU/L — ABNORMAL HIGH (ref 0–40)
Albumin/Globulin Ratio: 1.3 (ref 1.2–2.2)
Albumin: 4.4 g/dL (ref 3.8–4.8)
Alkaline Phosphatase: 85 IU/L (ref 39–117)
BUN/Creatinine Ratio: 14 (ref 9–23)
BUN: 10 mg/dL (ref 6–24)
Bilirubin Total: 0.3 mg/dL (ref 0.0–1.2)
CO2: 18 mmol/L — ABNORMAL LOW (ref 20–29)
Calcium: 9.4 mg/dL (ref 8.7–10.2)
Chloride: 103 mmol/L (ref 96–106)
Creatinine, Ser: 0.72 mg/dL (ref 0.57–1.00)
GFR calc Af Amer: 115 mL/min/{1.73_m2} (ref >59)
GFR calc non Af Amer: 100 mL/min/{1.73_m2} (ref >59)
Globulin, Total: 3.5 g/dL (ref 1.5–4.5)
Glucose: 102 mg/dL — ABNORMAL HIGH (ref 65–99)
Potassium: 4.6 mmol/L (ref 3.5–5.2)
Sodium: 137 mmol/L (ref 134–144)
Total Protein: 7.9 g/dL (ref 6.0–8.5)

## 2019-09-27 LAB — CBC
Hematocrit: 31.2 % — ABNORMAL LOW (ref 34.0–46.6)
Hemoglobin: 9.5 g/dL — ABNORMAL LOW (ref 11.1–15.9)
MCH: 23.4 pg — ABNORMAL LOW (ref 26.6–33.0)
MCHC: 30.4 g/dL — ABNORMAL LOW (ref 31.5–35.7)
MCV: 77 fL — ABNORMAL LOW (ref 79–97)
Platelets: 338 10*3/uL (ref 150–450)
RBC: 4.06 x10E6/uL (ref 3.77–5.28)
RDW: 17.6 % — ABNORMAL HIGH (ref 11.7–15.4)
WBC: 9.5 10*3/uL (ref 3.4–10.8)

## 2019-09-27 MED ORDER — FERROUS SULFATE 325 (65 FE) MG PO TBEC: 325.0000 mg | DELAYED_RELEASE_TABLET | Freq: Two times a day (BID) | ORAL | 1 refills | Status: DC

## 2019-09-29 MED FILL — FERROUS SULFATE 325 MG TAB: 325 (65 FE) | 90 days supply | Qty: 180 | Fill #0

## 2019-09-30 MED FILL — ?ATORVASTATIN 10 MG TABLET: 10 | 30 days supply | Qty: 30 | Fill #1

## 2019-09-30 MED FILL — TRUE METRIX TEST STRIP: 50 days supply | Qty: 100 | Fill #1

## 2019-09-30 MED FILL — ?METFORMIN HCL 500MG TABLET: 500 | 30 days supply | Qty: 120 | Fill #1

## 2019-10-01 ENCOUNTER — Telehealth: Payer: Self-pay | Admitting: Nurse Practitioner

## 2019-10-01 NOTE — Telephone Encounter (Signed)
Patient called saying she was informed of her lab results but was told she need to continue taking iron medication. Looked at patient chart and did not see an Rx for iron tablets. Please f/u with pt.

## 2019-10-02 NOTE — Telephone Encounter (Signed)
Spoke to pharmacy, Pt. Picked up her iron tablets.

## 2019-10-21 MED FILL — TRUEplus LANCETS 28G MISC: 50 days supply | Qty: 100 | Fill #1

## 2019-10-21 MED FILL — LISINOPRIL 10 MG TABS: 10 | 30 days supply | Qty: 30 | Fill #2

## 2019-10-31 MED FILL — ?METFORMIN HCL 500MG TABLET: 500 | 30 days supply | Qty: 120 | Fill #2

## 2019-11-11 ENCOUNTER — Encounter (HOSPITAL_COMMUNITY): Payer: Self-pay

## 2019-11-11 ENCOUNTER — Other Ambulatory Visit: Payer: Self-pay

## 2019-11-11 ENCOUNTER — Ambulatory Visit (HOSPITAL_COMMUNITY)
Admission: EM | Admit: 2019-11-11 | Discharge: 2019-11-11 | Disposition: A | Discharge status: Home / Self Care | Payer: HRSA Program | Attending: Family Medicine | Admitting: Family Medicine

## 2019-11-11 DIAGNOSIS — Z20822 Contact with and (suspected) exposure to covid-19: Secondary | ICD-10-CM | POA: Insufficient documentation

## 2019-11-11 DIAGNOSIS — R531 Weakness: Secondary | ICD-10-CM

## 2019-11-11 DIAGNOSIS — D649 Anemia, unspecified: Secondary | ICD-10-CM | POA: Diagnosis not present

## 2019-11-11 DIAGNOSIS — Z79899 Other long term (current) drug therapy: Secondary | ICD-10-CM | POA: Diagnosis not present

## 2019-11-11 DIAGNOSIS — Z7984 Long term (current) use of oral hypoglycemic drugs: Secondary | ICD-10-CM | POA: Insufficient documentation

## 2019-11-11 DIAGNOSIS — R002 Palpitations: Secondary | ICD-10-CM | POA: Diagnosis not present

## 2019-11-11 DIAGNOSIS — E669 Obesity, unspecified: Secondary | ICD-10-CM | POA: Insufficient documentation

## 2019-11-11 DIAGNOSIS — R6883 Chills (without fever): Secondary | ICD-10-CM | POA: Diagnosis not present

## 2019-11-11 DIAGNOSIS — Z7901 Long term (current) use of anticoagulants: Secondary | ICD-10-CM | POA: Insufficient documentation

## 2019-11-11 DIAGNOSIS — E119 Type 2 diabetes mellitus without complications: Secondary | ICD-10-CM | POA: Insufficient documentation

## 2019-11-11 DIAGNOSIS — I10 Essential (primary) hypertension: Secondary | ICD-10-CM | POA: Insufficient documentation

## 2019-11-11 MED ORDER — HYDROXYZINE HCL 25 MG PO TABS: 12.5000 mg | ORAL_TABLET | Freq: Three times a day (TID) | ORAL | 0 refills | Status: DC | PRN

## 2019-11-11 NOTE — Discharge Instructions (Addendum)
Para el dolor de garganta o tos puede usar un t de miel. Use 3 cucharaditas de miel con jugo exprimido de [employer]. Coloque trozos de Microbiologist en 1/2-1 taza de agua y caliente sobre la estufa. Luego mezcle los ingredientes y repita cada 4 horas. Tome Tylenol 500 mg cada 6 horas. Hidrata muy bien con al menos 2 litros de agua al dia. Coma comidas ligeras como sopas para [employer] electrolitos y coma frutas, verduras. Comience un antihistamnico como Zyrtec (cetirizina) 10mg  al dia.

## 2019-11-11 NOTE — ED Triage Notes (Addendum)
Pt reports 1 hr ago she started feeling weak, with chills, palpitations and her legs were shaking. Pt denies chest pain. Pt reports she took 1 tab lisinopril 10 MG at 8 am, pt took and extra dose 30 min ago.

## 2019-11-11 NOTE — ED Notes (Signed)
EKG was performed. Dr. Tracie Harrier reviewed the EKG. Pt was in formed to go to the waiting room and let us know if she has any change in her symptoms and we will put her in a room. Pt is currently stable and her HR is 95.

## 2019-11-11 NOTE — ED Provider Notes (Signed)
[facility]   MRN: [id number] DOB: November 10, 1970  Subjective:   Rebecca Sharp is a 49 y.o. female presenting for acute onset today of chills, weakness, palpitations after taking her dose of lisinopril this morning.  She felt very shaky, states that her legs felt weak.  She ended up taking an extra dose of her lisinopril.  She also checked her blood sugar which she maintains is well controlled, had readings of 102 and 112 today.  Denies any known COVID-19 contacts.  No current facility-administered medications for this encounter.  Current Outpatient Medications:  .  atorvastatin (LIPITOR) 10 MG tablet, Take 1 tablet (10 mg total) by mouth daily., Disp: 90 tablet, Rfl: 3 .  Blood Glucose Monitoring Suppl (TRUE METRIX METER) w/Device KIT, 1 each by Does not apply route 2 (two) times daily., Disp: 1 kit, Rfl: 0 .  ferrous sulfate 325 (65 FE) MG EC tablet, Take 1 tablet (325 mg total) by mouth 2 (two) times daily with a meal., Disp: 180 tablet, Rfl: 1 .  glucose blood (TRUE METRIX BLOOD GLUCOSE TEST) test strip, Use as instructed, Disp: 100 each, Rfl: 12 .  lisinopril (ZESTRIL) 10 MG tablet, Take 1 tablet (10 mg total) by mouth daily., Disp: 90 tablet, Rfl: 3 .  metFORMIN (GLUCOPHAGE) 500 MG tablet, Take 2 tablets (1,000 mg total) by mouth 2 (two) times daily with a meal., Disp: 120 tablet, Rfl: 5 .  Multiple Vitamin (MULTIVITAMIN) tablet, Take 1 tablet by mouth daily. Reported on 02/17/2016, Disp: , Rfl:  .  TRUEplus Lancets 28G MISC, 1 each by Does not apply route 2 (two) times daily., Disp: 100 each, Rfl: 2   No Known Allergies  Past Medical History:  Diagnosis Date  . AMA (advanced maternal age) multigravida 44+   . Anemia   . Ganglion cyst 01/25/2017   Left ankle  . Kidney stones 2013   With pregnancy   . Language barrier   . Obese   . Obesity 01/25/2017  . Prediabetes 02/17/2016  . Pregnancy induced hypertension    at end of last 2 pregnancies     Past Surgical  History:  Procedure Laterality Date  . NO PAST SURGERIES      Family History  Problem Relation Age of Onset  . Diabetes Father   . Hypertension Father     Social History   Tobacco Use  . Smoking status: Never Smoker  . Smokeless tobacco: Never Used  Substance Use Topics  . Alcohol use: No    Alcohol/week: 0.0 standard drinks  . Drug use: No    ROS   Objective:   Vitals: BP 135/82 (BP Location: Right Arm)   Pulse 95   Temp 98.2 F (36.8 C) (Oral)   Resp 18   LMP 10/20/2019 (Exact Date)   SpO2 100%   Physical Exam Constitutional:      General: She is not in acute distress.    Appearance: Normal appearance. She is well-developed. She is not ill-appearing, toxic-appearing or diaphoretic.  HENT:     Head: Normocephalic and atraumatic.     Nose: Nose normal.     Mouth/Throat:     Mouth: Mucous membranes are moist.  Eyes:     Extraocular Movements: Extraocular movements intact.     Pupils: Pupils are equal, round, and reactive to light.  Cardiovascular:     Rate and Rhythm: Normal rate and regular rhythm.     Pulses: Normal pulses.     Heart sounds: Normal  heart sounds. No murmur. No friction rub. No gallop.   Pulmonary:     Effort: Pulmonary effort is normal. No respiratory distress.     Breath sounds: Normal breath sounds. No stridor. No wheezing, rhonchi or rales.  Skin:    General: Skin is warm and dry.     Findings: No rash.  Neurological:     Mental Status: She is alert and oriented to person, place, and time.     Cranial Nerves: No cranial nerve deficit.     Motor: No weakness.     Coordination: Coordination normal.     Deep Tendon Reflexes: Reflexes normal.  Psychiatric:        Behavior: Behavior normal.        Thought Content: Thought content normal.        Judgment: Judgment normal.     Comments: Anxious demeanor.     ED ECG REPORT   Date: 11/11/2019  Rate: 93bpm  Rhythm: normal sinus rhythm  QRS Axis: normal  Intervals: normal  ST/T  Wave abnormalities: nonspecific T wave changes  Conduction Disutrbances:none  Narrative Interpretation: Nonspecific T wave flattening in lead III, otherwise sinus rhythm at 93 bpm.  No previous EKG for comparison.  Old EKG Reviewed: none available  I have personally reviewed the EKG tracing and agree with the computerized printout as noted.   Assessment and Plan :   1. Chills   2. Weakness   3. Palpitations   4. Essential hypertension   5. Well controlled diabetes mellitus ([place])   6. Exposure to COVID-19 virus    Emphasized need to take medications as prescribed including her lisinopril and Metformin.  Discussed possibility of a panic attack and will use hydroxyzine to help with this.  COVID-19 testing pending. Counseled patient on nature of COVID-19 including modes of transmission, diagnostic testing, management and supportive care.  Counseled on medications used for symptomatic relief.  Patient is to follow-up with PCP ASAP.  Counseled patient on potential for adverse effects with medications prescribed/recommended today, ER and return-to-clinic precautions discussed, patient verbalized understanding.     Jaynee Eagles, PA-C 11/11/19 1441

## 2019-11-13 LAB — NOVEL CORONAVIRUS, NAA (HOSP ORDER, SEND-OUT TO REF LAB; TAT 18-24 HRS): SARS-CoV-2, NAA: NOT DETECTED

## 2019-11-25 MED FILL — BETAMETHASONE DP AUG 0.05%: 0.05 | 30 days supply | Qty: 30 | Fill #1

## 2019-11-25 MED FILL — LISINOPRIL 10 MG TABS: 10 | 30 days supply | Qty: 30 | Fill #3

## 2019-11-27 ENCOUNTER — Other Ambulatory Visit: Payer: Self-pay

## 2019-11-27 ENCOUNTER — Encounter: Payer: Self-pay | Admitting: Nurse Practitioner

## 2019-11-27 ENCOUNTER — Ambulatory Visit: Payer: Self-pay | Attending: Nurse Practitioner | Admitting: Nurse Practitioner

## 2019-11-27 DIAGNOSIS — E785 Hyperlipidemia, unspecified: Secondary | ICD-10-CM

## 2019-11-27 DIAGNOSIS — D649 Anemia, unspecified: Secondary | ICD-10-CM

## 2019-11-27 DIAGNOSIS — E1165 Type 2 diabetes mellitus with hyperglycemia: Secondary | ICD-10-CM

## 2019-11-27 DIAGNOSIS — R7401 Elevation of levels of liver transaminase levels: Secondary | ICD-10-CM

## 2019-11-27 MED ORDER — METFORMIN HCL 500 MG PO TABS: 1000.0000 mg | ORAL_TABLET | Freq: Two times a day (BID) | ORAL | 0 refills | Status: DC

## 2019-11-27 MED ORDER — FERROUS SULFATE 325 (65 FE) MG PO TBEC: 325.0000 mg | DELAYED_RELEASE_TABLET | Freq: Two times a day (BID) | ORAL | 1 refills | Status: DC

## 2019-11-27 MED FILL — metFORMIN HCL 500 MG TABS: 500 | 30 days supply | Qty: 120 | Fill #0

## 2019-11-27 NOTE — Progress Notes (Signed)
Virtual Visit via Telephone Note Due to national recommendations of social distancing due to [place] 19, telehealth visit is felt to be most appropriate for this patient at this time.  I discussed the limitations, risks, security and privacy concerns of performing an evaluation and management service by telephone and the availability of in person appointments. I also discussed with the patient that there may be a patient responsible charge related to this service. The patient expressed understanding and agreed to proceed.    I connected with Rebecca Sharp on 11/27/19  at   9:50 AM EST  EDT by telephone and verified that I am speaking with the correct person using two identifiers.   Consent I discussed the limitations, risks, security and privacy concerns of performing an evaluation and management service by telephone and the availability of in person appointments. I also discussed with the patient that there may be a patient responsible charge related to this service. The patient expressed understanding and agreed to proceed.   Location of Patient: Private  Residence   Location of Provider: [facility] [employer] [facility]    Persons participating in Telemedicine visit: Geryl Rankins FNP-BC [place]  Spanish Interpreter ID# [id number]   History of Present Illness: Telemedicine visit for: F/U  has a past medical history of AMA (advanced maternal age) multigravida 35+, Anemia, Ganglion cyst (01/25/2017), Kidney stones (2013), Language barrier, Obese, Obesity (01/25/2017), Prediabetes (02/17/2016), and Pregnancy induced hypertension.   DM TYPE 2 She is monitoring her blood glucose levels once a day fasting with average: 90s. I have instructed her to monitor her blood glucose levels postprandial as well. She denies any hypo or hyperglycemic symptoms. Overdue for eye exam.  Lab Results  Component Value Date   HGBA1C 10.8 (A) 08/21/2019     Dyslipidemia LDL is not at goal of <70. Her atorvastatin was placed on hold in November by another provider however I was not aware of this and she was not instructed to repeat her labs so she will need to repeat CMP. She denies any statin intolerance.  Lab Results  Component Value Date   LDLCALC 110 (H) 08/21/2019    Essential Hypertension Blood pressure is well controlled with Lisinopril 10 mg daily. Denies chest pain, shortness of breath, palpitations, lightheadedness, dizziness, headaches or BLE edema.  BP Readings from Last 3 Encounters:  11/11/19 135/82  09/26/19 124/79  08/21/19 140/88   Past Medical History:  Diagnosis Date  . AMA (advanced maternal age) multigravida 7+   . Anemia   . Ganglion cyst 01/25/2017   Left ankle  . Kidney stones 2013   With pregnancy   . Language barrier   . Obese   . Obesity 01/25/2017  . Prediabetes 02/17/2016  . Pregnancy induced hypertension    at end of last 2 pregnancies    Past Surgical History:  Procedure Laterality Date  . NO PAST SURGERIES      Family History  Problem Relation Age of Onset  . Diabetes Father   . Hypertension Father     Social History   Socioeconomic History  . Marital status: Single    Spouse name: Not on file  . Number of children: 3  . Years of education: <8th grade  . Highest education level: 6th grade  Occupational History  . Occupation: Airline pilot: [employer]  Tobacco Use  . Smoking status: Never Smoker  . Smokeless tobacco: Never Used  Substance and Sexual Activity  .  Alcohol use: No    Alcohol/week: 0.0 standard drinks  . Drug use: No  . Sexual activity: Yes    Birth control/protection: Condom    Comment: No current boyfriend or spouse-not interested  Other Topics Concern  . Not on file  Social History Narrative  . Not on file   Social Determinants of Health   Financial Resource Strain:   . Difficulty of Paying Living Expenses: Not on file  Food Insecurity:   . Worried  About Charity fundraiser in the Last Year: Not on file  . Ran Out of Food in the Last Year: Not on file  Transportation Needs:   . Lack of Transportation (Medical): Not on file  . Lack of Transportation (Non-Medical): Not on file  Physical Activity:   . Days of Exercise per Week: Not on file  . Minutes of Exercise per Session: Not on file  Stress:   . Feeling of Stress : Not on file  Social Connections:   . Frequency of Communication with Friends and Family: Not on file  . Frequency of Social Gatherings with Friends and Family: Not on file  . Attends Religious Services: Not on file  . Active Member of Clubs or Organizations: Not on file  . Attends Archivist Meetings: Not on file  . Marital Status: Not on file     Observations/Objective: Awake, alert and oriented x 3   Review of Systems  Constitutional: Negative for fever, malaise/fatigue and weight loss.  HENT: Negative.  Negative for nosebleeds.   Eyes: Negative.  Negative for blurred vision, double vision and photophobia.  Respiratory: Negative.  Negative for cough and shortness of breath.   Cardiovascular: Negative.  Negative for chest pain, palpitations and leg swelling.  Gastrointestinal: Negative.  Negative for abdominal pain, blood in stool, constipation, diarrhea, heartburn, melena, nausea and vomiting.  Musculoskeletal: Negative.  Negative for myalgias.  Neurological: Negative.  Negative for dizziness, focal weakness, seizures and headaches.  Psychiatric/Behavioral: Negative.  Negative for suicidal ideas.    Assessment and Plan: Katheline was seen today for follow-up.  Diagnoses and all orders for this visit:  Elevated liver transaminase level -     CMP14+EGFR; Future Avoid tylenol products, alcohol  Type 2 diabetes mellitus with hyperglycemia, without long-term current use of insulin (HCC) -     metFORMIN (GLUCOPHAGE) 500 MG tablet; Take 2 tablets (1,000 mg total) by mouth 2 (two) times daily with a  meal. -     Ambulatory referral to Ophthalmology -     Hemoglobin A1c; Future Continue blood sugar control as discussed in office today, low carbohydrate diet, and regular physical exercise as tolerated, 150 minutes per week (30 min each day, 5 days per week, or 50 min 3 days per week). Keep blood sugar logs with fasting goal of 90-130 mg/dl, post prandial (after you eat) less than 180.  For Hypoglycemia: BS <60 and Hyperglycemia BS >400; contact the clinic ASAP. Annual eye exams and foot exams are recommended.   Anemia, unspecified type -     CBC; Future  Dyslipidemia, goal LDL below 70 -     Lipid panel; Future     Follow Up Instructions Return in about 3 months (around 02/24/2020).     I discussed the assessment and treatment plan with the patient. The patient was provided an opportunity to ask questions and all were answered. The patient agreed with the plan and demonstrated an understanding of the instructions.   The patient was  advised to call back or seek an in-person evaluation if the symptoms worsen or if the condition fails to improve as anticipated.  I provided 18 minutes of non-face-to-face time during this encounter including median intraservice time, reviewing previous notes, labs, imaging, medications and explaining diagnosis and management.  Gildardo Pounds, FNP-BC

## 2019-11-28 ENCOUNTER — Ambulatory Visit: Payer: Self-pay | Admitting: Nurse Practitioner

## 2019-12-02 ENCOUNTER — Ambulatory Visit: Payer: Self-pay | Attending: Nurse Practitioner

## 2019-12-02 ENCOUNTER — Other Ambulatory Visit: Payer: Self-pay

## 2019-12-02 DIAGNOSIS — D649 Anemia, unspecified: Secondary | ICD-10-CM

## 2019-12-02 DIAGNOSIS — E785 Hyperlipidemia, unspecified: Secondary | ICD-10-CM

## 2019-12-02 DIAGNOSIS — E1165 Type 2 diabetes mellitus with hyperglycemia: Secondary | ICD-10-CM

## 2019-12-02 DIAGNOSIS — R7401 Elevation of levels of liver transaminase levels: Secondary | ICD-10-CM

## 2019-12-03 LAB — CMP14+EGFR
ALT: 20 IU/L (ref 0–32)
AST: 23 IU/L (ref 0–40)
Albumin/Globulin Ratio: 1.3 (ref 1.2–2.2)
Albumin: 4.3 g/dL (ref 3.8–4.8)
Alkaline Phosphatase: 71 IU/L (ref 39–117)
BUN/Creatinine Ratio: 16 (ref 9–23)
BUN: 11 mg/dL (ref 6–24)
Bilirubin Total: <0.2 mg/dL (ref 0.0–1.2)
CO2: 19 mmol/L — ABNORMAL LOW (ref 20–29)
Calcium: 9.7 mg/dL (ref 8.7–10.2)
Chloride: 104 mmol/L (ref 96–106)
Creatinine, Ser: 0.67 mg/dL (ref 0.57–1.00)
GFR calc Af Amer: 121 mL/min/{1.73_m2} (ref >59)
GFR calc non Af Amer: 105 mL/min/{1.73_m2} (ref >59)
Globulin, Total: 3.3 g/dL (ref 1.5–4.5)
Glucose: 97 mg/dL (ref 65–99)
Potassium: 4.8 mmol/L (ref 3.5–5.2)
Sodium: 139 mmol/L (ref 134–144)
Total Protein: 7.6 g/dL (ref 6.0–8.5)

## 2019-12-03 LAB — CBC
Hematocrit: 31.7 % — ABNORMAL LOW (ref 34.0–46.6)
Hemoglobin: 9.6 g/dL — ABNORMAL LOW (ref 11.1–15.9)
MCH: 23.9 pg — ABNORMAL LOW (ref 26.6–33.0)
MCHC: 30.3 g/dL — ABNORMAL LOW (ref 31.5–35.7)
MCV: 79 fL (ref 79–97)
Platelets: 318 10*3/uL (ref 150–450)
RBC: 4.01 x10E6/uL (ref 3.77–5.28)
RDW: 16.5 % — ABNORMAL HIGH (ref 11.7–15.4)
WBC: 9.8 10*3/uL (ref 3.4–10.8)

## 2019-12-03 LAB — LIPID PANEL
Chol/HDL Ratio: 4.7 ratio — ABNORMAL HIGH (ref 0.0–4.4)
Cholesterol, Total: 166 mg/dL (ref 100–199)
HDL: 35 mg/dL — ABNORMAL LOW (ref >39)
LDL Chol Calc (NIH): 102 mg/dL — ABNORMAL HIGH (ref 0–99)
Triglycerides: 163 mg/dL — ABNORMAL HIGH (ref 0–149)
VLDL Cholesterol Cal: 29 mg/dL (ref 5–40)

## 2019-12-03 LAB — HEMOGLOBIN A1C
Est. average glucose Bld gHb Est-mCnc: 114 mg/dL
Hgb A1c MFr Bld: 5.6 % (ref 4.8–5.6)

## 2019-12-05 ENCOUNTER — Other Ambulatory Visit: Payer: Self-pay | Admitting: Nurse Practitioner

## 2019-12-05 DIAGNOSIS — E1165 Type 2 diabetes mellitus with hyperglycemia: Secondary | ICD-10-CM

## 2019-12-05 DIAGNOSIS — D500 Iron deficiency anemia secondary to blood loss (chronic): Secondary | ICD-10-CM

## 2019-12-05 DIAGNOSIS — E119 Type 2 diabetes mellitus without complications: Secondary | ICD-10-CM

## 2019-12-05 MED ORDER — ATORVASTATIN CALCIUM 20 MG PO TABS: 20.0000 mg | ORAL_TABLET | Freq: Every day | ORAL | 1 refills | Status: DC

## 2019-12-05 MED FILL — ATORVASTATIN CALCIUM 20 MG: 20 | 30 days supply | Qty: 30 | Fill #0

## 2019-12-30 MED FILL — metFORMIN HCL 500 MG TABS: 500 | 30 days supply | Qty: 120 | Fill #1

## 2019-12-30 MED FILL — TRUEplus LANCETS 28G MISC: 50 days supply | Qty: 100 | Fill #2

## 2019-12-30 MED FILL — TRUE METRIX TEST STRIP: 50 days supply | Qty: 100 | Fill #2

## 2019-12-30 MED FILL — LISINOPRIL 10 MG TABS: 10 | 30 days supply | Qty: 30 | Fill #4

## 2020-01-09 ENCOUNTER — Ambulatory Visit (INDEPENDENT_AMBULATORY_CARE_PROVIDER_SITE_OTHER): Payer: Self-pay | Admitting: Family Medicine

## 2020-01-09 ENCOUNTER — Other Ambulatory Visit: Payer: Self-pay

## 2020-01-09 ENCOUNTER — Encounter: Payer: Self-pay | Admitting: Family Medicine

## 2020-01-09 VITALS — BP 119/71 | HR 87 | Wt 175.1 lb

## 2020-01-09 DIAGNOSIS — D500 Iron deficiency anemia secondary to blood loss (chronic): Secondary | ICD-10-CM

## 2020-01-09 DIAGNOSIS — N920 Excessive and frequent menstruation with regular cycle: Secondary | ICD-10-CM

## 2020-01-09 MED ORDER — FERROUS SULFATE 325 (65 FE) MG PO TBEC: 325.0000 mg | DELAYED_RELEASE_TABLET | ORAL | 1 refills | Status: DC

## 2020-01-09 MED FILL — FERROUS SULFATE 325 MG TAB: 325 (65 FE) | 30 days supply | Qty: 15 | Fill #0

## 2020-01-09 NOTE — Progress Notes (Signed)
   Subjective:    Patient ID: Rebecca Sharp, female    DOB: 1971/07/10, 49 y.o.   MRN: [id number]  HPI This is a 48 yo G3P3 who was referred to our office for anemia with regular menses. Menses interval about 24-28 with 5 days with 3 days of heavier bleeding (changing pad 4-5 times a day), with 2 additional light days. Has been taking iron BID for a few months without improvement. Reports being consistent with iron consumption. No breakthrough bleeding.  I have reviewed the patients past medical, family, and social history.  I have reviewed the patient's medication list and allergies.  Gets PAPs at [facility]. Had one within past 3 years.   Review of Systems     Objective:   Physical Exam Vitals reviewed.  Constitutional:      Appearance: Normal appearance.  Cardiovascular:     Rate and Rhythm: Normal rate and regular rhythm.     Pulses: Normal pulses.  Abdominal:     General: Abdomen is flat. There is no distension.     Palpations: Abdomen is soft. There is no mass.     Tenderness: There is no abdominal tenderness. There is no guarding or rebound.     Hernia: No hernia is present.  Skin:    Capillary Refill: Capillary refill takes less than 2 seconds.  Neurological:     General: No focal deficit present.     Mental Status: She is alert. Mental status is at baseline. She is disoriented.  Psychiatric:        Mood and Affect: Mood normal.        Behavior: Behavior normal.        Thought Content: Thought content normal.        Judgment: Judgment normal.       Assessment & Plan:  1. Iron deficiency anemia secondary to blood loss (chronic) Patient's hemoglobin hasn't risen much from November to February. Occasionally, people on BID dosing of iron will have a paradoxical reaction and have worse iron absorption.  (Stoffel NU, Cercamondi CI, Brittenham G, Zeder C, [place], Swinkels DW, [place], [place] MB. Iron absorption from oral iron supplements given on  consecutive versus alternate days and as single morning doses versus twice-daily split dosing in iron-depleted women: two open-label, randomised controlled trials. Lancet Haematol. 2017 Nov;4(11):e524-e533. doi: 10.1016/S2352-3026(17)30182-5. Epub 2017 Oct 9. PMID: [id number])  Will check CBC and iron studies. Decrease iron to every other day dosing.  - CBC - Iron, TIBC and Ferritin Panel  2. Menorrhagia with regular cycle Her bleeding appears quite normal for her age. With regular cycles, no breakthrough bleeding, there is no need for workup with endometrial biopsy or [place]). Discussed possibilities of suppressing bleeding with COC or IUD. Patient declined. F/u in 2 months

## 2020-01-09 NOTE — Progress Notes (Signed)
States the clinic sent her over for anemia.  States she's had her menstruation and its just a little bit.   In January had her menstruation twice in one month. 1st time that ever happened.

## 2020-01-10 LAB — CBC
Hematocrit: 32.7 % — ABNORMAL LOW (ref 34.0–46.6)
Hemoglobin: 10.3 g/dL — ABNORMAL LOW (ref 11.1–15.9)
MCH: 25.1 pg — ABNORMAL LOW (ref 26.6–33.0)
MCHC: 31.5 g/dL (ref 31.5–35.7)
MCV: 80 fL (ref 79–97)
Platelets: 310 10*3/uL (ref 150–450)
RBC: 4.1 x10E6/uL (ref 3.77–5.28)
RDW: 15.7 % — ABNORMAL HIGH (ref 11.7–15.4)
WBC: 8.6 10*3/uL (ref 3.4–10.8)

## 2020-01-10 LAB — IRON,TIBC AND FERRITIN PANEL
Ferritin: 22 ng/mL (ref 15–150)
Iron Saturation: 10 % — ABNORMAL LOW (ref 15–55)
Iron: 41 ug/dL (ref 27–159)
Total Iron Binding Capacity: 409 ug/dL (ref 250–450)
UIBC: 368 ug/dL (ref 131–425)

## 2020-01-27 MED FILL — LISINOPRIL 10 MG TABS: 10 | 30 days supply | Qty: 30 | Fill #5

## 2020-01-27 MED FILL — FERROUS SULFATE 325 MG TAB: 325 (65 FE) | 30 days supply | Qty: 15 | Fill #0

## 2020-02-03 MED FILL — METFORMIN HCL 500 MG TABS: 500 | 30 days supply | Qty: 120 | Fill #2

## 2020-02-09 ENCOUNTER — Ambulatory Visit: Payer: Self-pay | Admitting: Family Medicine

## 2020-02-10 MED FILL — TRUE METRIX TEST STRIP: 50 days supply | Qty: 100 | Fill #3

## 2020-02-20 ENCOUNTER — Other Ambulatory Visit: Payer: Self-pay | Admitting: Nurse Practitioner

## 2020-02-20 ENCOUNTER — Ambulatory Visit: Payer: Self-pay | Attending: Nurse Practitioner | Admitting: Nurse Practitioner

## 2020-02-20 ENCOUNTER — Encounter: Payer: Self-pay | Admitting: Nurse Practitioner

## 2020-02-20 ENCOUNTER — Other Ambulatory Visit: Payer: Self-pay

## 2020-02-20 DIAGNOSIS — H5712 Ocular pain, left eye: Secondary | ICD-10-CM

## 2020-02-20 DIAGNOSIS — I10 Essential (primary) hypertension: Secondary | ICD-10-CM

## 2020-02-20 DIAGNOSIS — E1165 Type 2 diabetes mellitus with hyperglycemia: Secondary | ICD-10-CM

## 2020-02-20 DIAGNOSIS — E785 Hyperlipidemia, unspecified: Secondary | ICD-10-CM

## 2020-02-20 MED ORDER — LISINOPRIL 10 MG PO TABS: 10.0000 mg | ORAL_TABLET | Freq: Every day | ORAL | 3 refills | Status: DC

## 2020-02-20 MED ORDER — TRUEPLUS LANCETS 28G MISC: 1.0000 | Freq: Two times a day (BID) | 2 refills | Status: DC

## 2020-02-20 MED ORDER — TRUE METRIX BLOOD GLUCOSE TEST VI STRP: ORAL_STRIP | 12 refills | Status: DC

## 2020-02-20 MED ORDER — METFORMIN HCL 500 MG PO TABS: 1000.0000 mg | ORAL_TABLET | Freq: Two times a day (BID) | ORAL | 0 refills | Status: DC

## 2020-02-20 MED ORDER — ATORVASTATIN CALCIUM 20 MG PO TABS: 20.0000 mg | ORAL_TABLET | Freq: Every day | ORAL | 1 refills | Status: DC

## 2020-02-20 MED FILL — LISINOPRIL 10 MG TABS: 10 | 30 days supply | Qty: 30 | Fill #6

## 2020-02-20 MED FILL — ATORVASTATIN CALCIUM 20 MG: 20 | 30 days supply | Qty: 30 | Fill #0

## 2020-02-20 NOTE — Progress Notes (Signed)
Virtual Visit via Telephone Note Due to national recommendations of social distancing due to COVID 19, telehealth visit is felt to be most appropriate for this patient at this time.  I discussed the limitations, risks, security and privacy concerns of performing an evaluation and management service by telephone and the availability of in person appointments. I also discussed with the patient that there may be a patient responsible charge related to this service. The patient expressed understanding and agreed to proceed.    I connected with Rebecca Sharp on 02/20/20  at   3:30 PM EDT  EDT by telephone and verified that I am speaking with the correct person using two identifiers.   Consent I discussed the limitations, risks, security and privacy concerns of performing an evaluation and management service by telephone and the availability of in person appointments. I also discussed with the patient that there may be a patient responsible charge related to this service. The patient expressed understanding and agreed to proceed.   Location of Patient: Private Residence   Location of Provider: [facility]    Persons participating in Telemedicine visit: Rebecca Denver FNP-BC [place] CMA [place] Gabriel Earing  [id number]   History of Present Illness: Telemedicine visit for: F/U  DM TYPE 2 Well controlled. Monitoring twice per day. Today's reading was 92 Fasting. She denies any hypo or hyperglycemic symptoms. Taking metformin 1000 mg BID. She is also taking atorvastatin  Lab Results  Component Value Date   HGBA1C 5.6 12/02/2019    EYE PROBLEM Endorses left eye (lateral canthus) discomfort. She denies any headaches, visual disturbances, eye injury for floaters. Feels like sand is in her eye. She has not had an eye exam. Referred today.   HTN Taking low dose lisinopril 10 mg daily as prescribed. Denies chest pain, shortness of breath, palpitations,  lightheadedness, dizziness, headaches or BLE edema.  BP Readings from Last 3 Encounters:  01/09/20 119/71  11/11/19 135/82  09/26/19 124/79   Past Medical History:  Diagnosis Date  . AMA (advanced maternal age) multigravida 35+   . Anemia   . Ganglion cyst 01/25/2017   Left ankle  . Kidney stones 2013   With pregnancy   . Language barrier   . Obese   . Obesity 01/25/2017  . Prediabetes 02/17/2016  . Pregnancy induced hypertension    at end of last 2 pregnancies    Past Surgical History:  Procedure Laterality Date  . NO PAST SURGERIES      Family History  Problem Relation Age of Onset  . Diabetes Father   . Hypertension Father     Social History   Socioeconomic History  . Marital status: Single    Spouse name: Not on file  . Number of children: 3  . Years of education: <8th grade  . Highest education level: 6th grade  Occupational History  . Occupation: Multimedia programmer: [employer]  Tobacco Use  . Smoking status: Never Smoker  . Smokeless tobacco: Never Used  Substance and Sexual Activity  . Alcohol use: No    Alcohol/week: 0.0 standard drinks  . Drug use: No  . Sexual activity: Yes    Birth control/protection: Condom    Comment: No current boyfriend or spouse-not interested  Other Topics Concern  . Not on file  Social History Narrative  . Not on file   Social Determinants of Health   Financial Resource Strain:   . Difficulty of Paying Living Expenses:  Food Insecurity:   . Worried About Programme researcher, broadcasting/film/video in the Last Year:   . Barista in the Last Year:   Transportation Needs:   . Freight forwarder (Medical):   Marland Kitchen Lack of Transportation (Non-Medical):   Physical Activity:   . Days of Exercise per Week:   . Minutes of Exercise per Session:   Stress:   . Feeling of Stress :   Social Connections:   . Frequency of Communication with Friends and Family:   . Frequency of Social Gatherings with Friends and Family:   . Attends Religious  Services:   . Active Member of Clubs or Organizations:   . Attends Banker Meetings:   Marland Kitchen Marital Status:      Observations/Objective: Awake, alert and oriented x 3   Review of Systems  Constitutional: Negative for fever, malaise/fatigue and weight loss.  HENT: Negative.  Negative for nosebleeds.   Eyes: Positive for pain. Negative for blurred vision, double vision, photophobia, discharge and redness.  Respiratory: Negative.  Negative for cough and shortness of breath.   Cardiovascular: Negative.  Negative for chest pain, palpitations and leg swelling.  Gastrointestinal: Negative.  Negative for heartburn, nausea and vomiting.  Musculoskeletal: Negative.  Negative for myalgias.  Neurological: Negative.  Negative for dizziness, focal weakness, seizures and headaches.  Psychiatric/Behavioral: Negative.  Negative for suicidal ideas.    Assessment and Plan: Stephaine was seen today for eye pain.  Diagnoses and all orders for this visit:  Type 2 diabetes mellitus with hyperglycemia, without long-term current use of insulin (HCC) -     Ambulatory referral to Ophthalmology -     glucose blood (TRUE METRIX BLOOD GLUCOSE TEST) test strip; Use as instructed -     atorvastatin (LIPITOR) 20 MG tablet; Take 1 tablet (20 mg total) by mouth daily. -     metFORMIN (GLUCOPHAGE) 500 MG tablet; Take 2 tablets (1,000 mg total) by mouth 2 (two) times daily with a meal. -     TRUEplus Lancets 28G MISC; 1 each by Does not apply route 2 (two) times daily. Continue blood sugar control as discussed in office today, low carbohydrate diet, and regular physical exercise as tolerated, 150 minutes per week (30 min each day, 5 days per week, or 50 min 3 days per week). Keep blood sugar logs with fasting goal of 90-130 mg/dl, post prandial (after you eat) less than 180.  For Hypoglycemia: BS <60 and Hyperglycemia BS >400; contact the clinic ASAP. Annual eye exams and foot exams are  recommended.   Dyslipidemia, goal LDL below 70 INSTRUCTIONS: Work on a low fat, heart healthy diet and participate in regular aerobic exercise program by working out at least 150 minutes per week; 5 days a week-30 minutes per day. Avoid red meat/beef/steak,  fried foods. junk foods, sodas, sugary drinks, unhealthy snacking, alcohol and smoking.  Drink at least 80 oz of water per day and monitor your carbohydrate intake daily.    Essential hypertension -     lisinopril (ZESTRIL) 10 MG tablet; Take 1 tablet (10 mg total) by mouth daily. Continue all antihypertensives as prescribed.  Remember to bring in your blood pressure log with you for your follow up appointment.  DASH/Mediterranean Diets are healthier choices for HTN.    Pain of left eye -     Ambulatory referral to Ophthalmology     Follow Up Instructions Return in about 3 months (around 05/22/2020).     I discussed  the assessment and treatment plan with the patient. The patient was provided an opportunity to ask questions and all were answered. The patient agreed with the plan and demonstrated an understanding of the instructions.   The patient was advised to call back or seek an in-person evaluation if the symptoms worsen or if the condition fails to improve as anticipated.  I provided 17 minutes of non-face-to-face time during this encounter including median intraservice time, reviewing previous notes, labs, imaging, medications and explaining diagnosis and management.  Gildardo Pounds, FNP-BC

## 2020-02-23 ENCOUNTER — Other Ambulatory Visit: Payer: Self-pay

## 2020-02-23 ENCOUNTER — Ambulatory Visit: Payer: Self-pay | Attending: Nurse Practitioner

## 2020-03-01 MED FILL — TRUEplus LANCETS 28G MISC: 50 days supply | Qty: 100 | Fill #0

## 2020-03-01 MED FILL — METFORMIN HCL 500 MG TABS: 500 | 30 days supply | Qty: 120 | Fill #0

## 2020-03-01 MED FILL — FERROUS SULFATE 325 MG TAB: 325 (65 FE) | 30 days supply | Qty: 15 | Fill #1

## 2020-03-08 ENCOUNTER — Ambulatory Visit (INDEPENDENT_AMBULATORY_CARE_PROVIDER_SITE_OTHER): Payer: Self-pay | Admitting: Family Medicine

## 2020-03-08 ENCOUNTER — Other Ambulatory Visit: Payer: Self-pay

## 2020-03-08 VITALS — BP 107/71 | HR 79

## 2020-03-08 DIAGNOSIS — Z789 Other specified health status: Secondary | ICD-10-CM

## 2020-03-08 DIAGNOSIS — D500 Iron deficiency anemia secondary to blood loss (chronic): Secondary | ICD-10-CM

## 2020-03-08 DIAGNOSIS — Z603 Acculturation difficulty: Secondary | ICD-10-CM

## 2020-03-08 LAB — CBC
Hematocrit: 31.6 % — ABNORMAL LOW (ref 34.0–46.6)
Hemoglobin: 9.8 g/dL — ABNORMAL LOW (ref 11.1–15.9)
MCH: 26 pg — ABNORMAL LOW (ref 26.6–33.0)
MCHC: 31 g/dL — ABNORMAL LOW (ref 31.5–35.7)
MCV: 84 fL (ref 79–97)
Platelets: 280 10*3/uL (ref 150–450)
RBC: 3.77 x10E6/uL (ref 3.77–5.28)
RDW: 15.6 % — ABNORMAL HIGH (ref 11.7–15.4)
WBC: 9.3 10*3/uL (ref 3.4–10.8)

## 2020-03-08 NOTE — Progress Notes (Signed)
   Subjective:    Patient ID: Rebecca Sharp, female    DOB: [birth year], 49 y.o.   MRN: [id number]  HPI Patient seen for anemia. She still has menses, but her menses is not heavy. Last appt, we discussed trial of OCPs or IUD to decrease her menses, but this was declined. SHe has been taking iron supplements every other day. Her last hemoglobin was 10.3 (up from 9.6). She reports occasionally feeling sleepy, but otherwise, no problems.   Review of Systems     Objective:   Physical Exam Vitals reviewed.  Constitutional:      Appearance: Normal appearance.  Cardiovascular:     Rate and Rhythm: Normal rate and regular rhythm.  Pulmonary:     Effort: Pulmonary effort is normal.  Abdominal:     General: Abdomen is flat. There is no distension.     Palpations: Abdomen is soft.  Neurological:     General: No focal deficit present.     Mental Status: She is alert.  Psychiatric:        Mood and Affect: Mood normal.        Behavior: Behavior normal.        Judgment: Judgment normal.       Assessment & Plan:  1. Iron deficiency anemia secondary to blood loss (chronic) Check CBC - CBC  2. Language barrier Interpreter used.

## 2020-03-08 NOTE — Progress Notes (Signed)
Spanish Interpreter Raquel M. Pt given Free Pap Smear and Mammogram Scholarship info.

## 2020-03-10 ENCOUNTER — Other Ambulatory Visit: Payer: Self-pay | Admitting: *Deleted

## 2020-03-10 ENCOUNTER — Telehealth (INDEPENDENT_AMBULATORY_CARE_PROVIDER_SITE_OTHER): Payer: HRSA Program | Admitting: Lactation Services

## 2020-03-10 DIAGNOSIS — D500 Iron deficiency anemia secondary to blood loss (chronic): Secondary | ICD-10-CM

## 2020-03-10 DIAGNOSIS — Z1231 Encounter for screening mammogram for malignant neoplasm of breast: Secondary | ICD-10-CM

## 2020-03-10 NOTE — Telephone Encounter (Signed)
Called patient with assistance of [employer], Research officer, trade union.   Patient was informed that she is still anemic. Informed patient that she needs to increase her iron to daily from once every other day.   Patient reminded to follow up with Dr. Meredeth Ide August 6. Patient voiced understanding.

## 2020-03-10 NOTE — Telephone Encounter (Signed)
-----   Message from Levie Heritage, DO sent at 03/10/2020  6:44 AM EDT ----- Still anemic. Increase iron to once a day

## 2020-03-17 MED FILL — LISINOPRIL 10 MG TABS: 10 | 30 days supply | Qty: 30 | Fill #7

## 2020-04-01 ENCOUNTER — Other Ambulatory Visit: Payer: Self-pay

## 2020-04-01 ENCOUNTER — Ambulatory Visit: Admission: RE | Admit: 2020-04-01 | Payer: Self-pay | Source: Ambulatory Visit

## 2020-04-02 MED FILL — METFORMIN HCL 500 MG TABS: 500 | 30 days supply | Qty: 120 | Fill #1

## 2020-04-13 MED FILL — TRUE METRIX TEST STRIP: 50 days supply | Qty: 100 | Fill #4

## 2020-04-27 MED FILL — LISINOPRIL 10 MG TABS: 10 | 30 days supply | Qty: 30 | Fill #8

## 2020-05-05 ENCOUNTER — Ambulatory Visit (HOSPITAL_COMMUNITY)
Admission: EM | Admit: 2020-05-05 | Discharge: 2020-05-05 | Disposition: A | Discharge status: Home / Self Care | Payer: Self-pay | Attending: Urgent Care | Admitting: Urgent Care

## 2020-05-05 ENCOUNTER — Encounter (HOSPITAL_COMMUNITY): Payer: Self-pay

## 2020-05-05 ENCOUNTER — Other Ambulatory Visit: Payer: Self-pay

## 2020-05-05 DIAGNOSIS — R251 Tremor, unspecified: Secondary | ICD-10-CM

## 2020-05-05 DIAGNOSIS — R0602 Shortness of breath: Secondary | ICD-10-CM

## 2020-05-05 DIAGNOSIS — F419 Anxiety disorder, unspecified: Secondary | ICD-10-CM

## 2020-05-05 DIAGNOSIS — I10 Essential (primary) hypertension: Secondary | ICD-10-CM

## 2020-05-05 MED ORDER — SERTRALINE HCL 50 MG PO TABS
50.0000 mg | ORAL_TABLET | Freq: Every day | ORAL | 0 refills | Status: DC
Start: 2020-05-05 — End: 2021-01-27

## 2020-05-05 MED FILL — SERTRALINE HCL 50 MG TABLET: 50 | 30 days supply | Qty: 30 | Fill #0

## 2020-05-05 NOTE — ED Provider Notes (Signed)
°[facility] ° ° °MRN: [id number] DOB: 01/26/1971 ° °Subjective:  ° °Rebecca Sharp is a 48 y.o. female presenting for checkup on her blood pressure.  States that she took this morning when she woke up and was at 140 systolic.  This was after she took her lisinopril just 20 minutes before.  She states that earlier this week she did have an episode of shortness of breath, shaking, diaphoresis sense of impending doom.  Denies having had chest pain, belly pain.  Has been checking her blood sugar as well and states that it has been really good.  She has significant concern about her health, has a father that is diabetic and is in dialysis.  She has previously been told that she has anxiety, has been prescribed hydroxyzine and took 1/2 tablet during her episode as described above reports that it made no difference in her symptoms. ° °No current facility-administered medications for this encounter. ° °Current Outpatient Medications:  °•  atorvastatin (LIPITOR) 20 MG tablet, Take 1 tablet (20 mg total) by mouth daily., Disp: 90 tablet, Rfl: 1 °•  Blood Glucose Monitoring Suppl (TRUE METRIX METER) w/Device KIT, 1 each by Does not apply route 2 (two) times daily., Disp: 1 kit, Rfl: 0 °•  ferrous sulfate 325 (65 FE) MG EC tablet, Take 1 tablet (325 mg total) by mouth every other day., Disp: 180 tablet, Rfl: 1 °•  glucose blood (TRUE METRIX BLOOD GLUCOSE TEST) test strip, Use as instructed, Disp: 100 each, Rfl: 12 °•  lisinopril (ZESTRIL) 10 MG tablet, Take 1 tablet (10 mg total) by mouth daily., Disp: 90 tablet, Rfl: 3 °•  metFORMIN (GLUCOPHAGE) 500 MG tablet, Take 2 tablets (1,000 mg total) by mouth 2 (two) times daily with a meal., Disp: 360 tablet, Rfl: 0 °•  TRUEplus Lancets 28G MISC, 1 each by Does not apply route 2 (two) times daily., Disp: 100 each, Rfl: 2  ° °No Known Allergies ° °Past Medical History:  °Diagnosis Date  °• AMA (advanced maternal age) multigravida 35+   °• Anemia   °• Ganglion cyst  01/25/2017  ° Left ankle  °• Kidney stones 2013  ° With pregnancy   °• Language barrier   °• Obese   °• Obesity 01/25/2017  °• Prediabetes 02/17/2016  °• Pregnancy induced hypertension   ° at end of last 2 pregnancies  °  ° °Past Surgical History:  °Procedure Laterality Date  °• NO PAST SURGERIES    ° ° °Family History  °Problem Relation Age of Onset  °• Diabetes Father   °• Hypertension Father   ° ° °Social History  ° °Tobacco Use  °• Smoking status: Never Smoker  °• Smokeless tobacco: Never Used  °Vaping Use  °• Vaping Use: Never used  °Substance Use Topics  °• Alcohol use: No  °  Alcohol/week: 0.0 standard drinks  °• Drug use: No  ° ° °ROS ° ° °Objective:  ° °Vitals: °BP 116/79 (BP Location: Right Arm)    Pulse 87    Temp 98.2 °F (36.8 °C) (Oral)    Resp 16    SpO2 97%  ° °Physical Exam °Constitutional:   °   General: She is not in acute distress. °   Appearance: Normal appearance. She is well-developed. She is obese. She is not ill-appearing, toxic-appearing or diaphoretic.  °HENT:  °   Head: Normocephalic and atraumatic.  °   Nose: Nose normal.  °   Mouth/Throat:  °   Mouth: Mucous   Mucous membranes are moist.  Eyes:     Extraocular Movements: Extraocular movements intact.     Pupils: Pupils are equal, round, and reactive to light.  Cardiovascular:     Rate and Rhythm: Normal rate and regular rhythm.     Pulses: Normal pulses.     Heart sounds: Normal heart sounds. No murmur heard.  No friction rub. No gallop.   Pulmonary:     Effort: Pulmonary effort is normal. No respiratory distress.     Breath sounds: Normal breath sounds. No stridor. No wheezing, rhonchi or rales.  Skin:    General: Skin is warm and dry.     Findings: No rash.  Neurological:     Mental Status: She is alert and oriented to person, place, and time.  Psychiatric:        Mood and Affect: Mood normal.        Behavior: Behavior normal.        Thought Content: Thought content normal.        Judgment: Judgment normal.     ED ECG  REPORT   Date: 05/05/2020  Rate: 76bpm  Rhythm: normal sinus rhythm  QRS Axis: normal  Intervals: normal  ST/T Wave abnormalities: normal  Conduction Disutrbances:none  Narrative Interpretation:   Old EKG Reviewed: unchanged  I have personally reviewed the EKG tracing and agree with the computerized printout as noted.    Assessment and Plan :   PDMP not reviewed this encounter.  1. Shortness of breath   2. Shaking   3. Essential hypertension     Reassured patient, advised against discontinuing her current medication.  Recommended follow-up with new PCP, Dr. Pamella Pert.  Discussed general health management.  We will have her start sertraline once daily.  Maintain hydroxyzine as needed for panic attacks, breakthrough anxiety.  Counseled patient on potential for adverse effects with medications prescribed/recommended today, ER and return-to-clinic precautions discussed, patient verbalized understanding.    Jaynee Eagles, PA-C 05/05/20 1150

## 2020-05-05 NOTE — ED Triage Notes (Addendum)
Pt presents today for high blood pressure. Pt states systolic was 140 at home. Pt also complaining of headache, fatigue, and pain in feet. Pt denies visual disturbances. Pt noted to be normotensive upon arrival. 116/79. Pt  concerned lisinopril dose is not adequate.

## 2020-05-07 ENCOUNTER — Other Ambulatory Visit: Payer: Self-pay

## 2020-05-07 ENCOUNTER — Encounter (HOSPITAL_COMMUNITY): Payer: Self-pay

## 2020-05-07 ENCOUNTER — Ambulatory Visit (HOSPITAL_COMMUNITY)
Admission: EM | Admit: 2020-05-07 | Discharge: 2020-05-07 | Disposition: A | Discharge status: Home / Self Care | Payer: Self-pay | Attending: Family Medicine | Admitting: Family Medicine

## 2020-05-07 DIAGNOSIS — T887XXA Unspecified adverse effect of drug or medicament, initial encounter: Secondary | ICD-10-CM

## 2020-05-07 DIAGNOSIS — F419 Anxiety disorder, unspecified: Secondary | ICD-10-CM

## 2020-05-07 DIAGNOSIS — G4700 Insomnia, unspecified: Secondary | ICD-10-CM

## 2020-05-07 NOTE — Discharge Instructions (Signed)
For the next 1 week take 1/2 tablet of Sertraline. after 1 week you may restart taking a full 1 tablet.  you may take Hydroxizine at night to help with sleep.  please follow up with your primary care provider for recheck and medication adjustments.  sometimes it can take even up to a month for this medication to be best tolerated.    Durante la prxima semana, tome 1/2 tableta de sertralina. despus de 1 semana, puede volver a tomar 1 comprimido completo. puede tomar hidroxizina por la noche para ayudarlo a dormir. por favor, haga un seguimiento con su proveedor de atencin primaria para volver a verificar y Research scientist (medical). A veces, este medicamento puede tardar hasta un mes en tolerarse mejor.

## 2020-05-07 NOTE — ED Triage Notes (Signed)
Patient reports she was prescribed zoloft in this office. States that she has been unable to sleep the last two nights and is worried it is from the medication. Reports she takes it in the AM.

## 2020-05-08 ENCOUNTER — Emergency Department (HOSPITAL_COMMUNITY)
Admission: EM | Admit: 2020-05-08 | Discharge: 2020-05-08 | Disposition: A | Discharge status: Home / Self Care | Payer: Self-pay | Attending: Emergency Medicine | Admitting: Emergency Medicine

## 2020-05-08 ENCOUNTER — Encounter (HOSPITAL_COMMUNITY): Payer: Self-pay

## 2020-05-08 DIAGNOSIS — F419 Anxiety disorder, unspecified: Secondary | ICD-10-CM | POA: Insufficient documentation

## 2020-05-08 DIAGNOSIS — F19982 Other psychoactive substance use, unspecified with psychoactive substance-induced sleep disorder: Secondary | ICD-10-CM | POA: Insufficient documentation

## 2020-05-08 DIAGNOSIS — R7303 Prediabetes: Secondary | ICD-10-CM | POA: Insufficient documentation

## 2020-05-08 DIAGNOSIS — Z79899 Other long term (current) drug therapy: Secondary | ICD-10-CM | POA: Insufficient documentation

## 2020-05-08 DIAGNOSIS — I10 Essential (primary) hypertension: Secondary | ICD-10-CM | POA: Insufficient documentation

## 2020-05-08 DIAGNOSIS — Z7984 Long term (current) use of oral hypoglycemic drugs: Secondary | ICD-10-CM | POA: Insufficient documentation

## 2020-05-08 MED ORDER — METFORMIN HCL 1000 MG PO TABS: 1000.0000 mg | ORAL_TABLET | Freq: Two times a day (BID) | ORAL | 0 refills | Status: DC

## 2020-05-08 NOTE — Discharge Instructions (Addendum)
Please call your PCP Monday to see if they can see you earlier than August 6th.   I would recommend continue taking the Zoloft at half dosage for the next couple of days in the morning and to continue taking the Hydroxyzine at half dose for the next couple of nights to see if this will help you sleep. If no improvement you can increase the hydroxyzine to a full tablet.   I have re-prescribed your Metformin at your request. The medication does appear to be the cheapest at [facility] ($4) and therefore I have printed it out for you. Please take to any [facility] of your choosing.   Return to the ED IMMEDIATELY for any worsening symptoms including visual or auditory hallucinations, blurry vision, double vision, chest pain, shortness of breath, dizziness/lightheadedness, or any other new/concerning symptoms.

## 2020-05-08 NOTE — ED Provider Notes (Signed)
[place] EMERGENCY DEPARTMENT Provider Note   CSN: [id number] Arrival date & time: 05/08/20  1115     History Chief Complaint  Patient presents with  . Insomnia    Rebecca Sharp is a 49 y.o. female with PMHx HTN and diabetes who presents to the ED today with complaint of insomnia.  She reports she was recently seen in urgent care for anxiety and started on Zoloft.  She states she started this medication on Wednesday 07/21 and since then has had trouble sleeping at night.  She states that Wednesday night she slept approximately 5 hours, Thursday night slept approximately 3 hours, last night slept approximately 1 hour.  She states she has been tossing and turning at nighttime and unable to turn her mind off.  Patient does report she went back to urgent care yesterday with similar complaints and was told to have her dose of Zoloft from 50 mg to 25 mg.  Patient also was given hydroxyzine to help her sleep.  She states she was told to take 1/2 tablet of the 25 mg hydroxyzine.  Patient states that she only started having the dose of the Zoloft today however it is here as she continues to have difficulty sleeping.  Patient denies dizziness, lightheadedness, blurred vision, double vision, visual hallucinations, auditory hallucinations, chest pain, shortness of breath, any other associated symptoms.  She is also requesting refill of her Metformin.  She takes 1000 mg twice daily, ran out yesterday and states that her pharmacy is currently closed.  They typically mail patient's prescriptions to her and she is unsure how long it will take them to send the prescription.   The history is provided by the patient and medical records.       Past Medical History:  Diagnosis Date  . AMA (advanced maternal age) multigravida 75+   . Anemia   . Ganglion cyst 01/25/2017   Left ankle  . Kidney stones 2013   With pregnancy   . Language barrier   . Obese   . Obesity 01/25/2017  .  Prediabetes 02/17/2016  . Pregnancy induced hypertension    at end of last 2 pregnancies    Patient Active Problem List   Diagnosis Date Noted  . Obesity 01/25/2017  . Ganglion cyst 01/25/2017  . Prediabetes 02/17/2016  . Essential hypertension 08/19/2015  . Kidney stones     Past Surgical History:  Procedure Laterality Date  . NO PAST SURGERIES       OB History    Gravida  3   Para  3   Term  3   Preterm  0   AB  0   Living  3     SAB  0   TAB  0   Ectopic  0   Multiple  0   Live Births  3           Family History  Problem Relation Age of Onset  . Diabetes Father   . Hypertension Father     Social History   Tobacco Use  . Smoking status: Never Smoker  . Smokeless tobacco: Never Used  Vaping Use  . Vaping Use: Never used  Substance Use Topics  . Alcohol use: No    Alcohol/week: 0.0 standard drinks  . Drug use: No    Home Medications Prior to Admission medications   Medication Sig Start Date End Date Taking? Authorizing Provider  atorvastatin (LIPITOR) 20 MG tablet Take 1 tablet (20  mg total) by mouth daily. 02/20/20 05/20/20  Gildardo Pounds, NP  Blood Glucose Monitoring Suppl (TRUE METRIX METER) w/Device KIT 1 each by Does not apply route 2 (two) times daily. 08/21/19   Argentina Donovan, PA-C  ferrous sulfate 325 (65 FE) MG EC tablet Take 1 tablet (325 mg total) by mouth every other day. 01/09/20 04/08/20  Truett Mainland, DO  glucose blood (TRUE METRIX BLOOD GLUCOSE TEST) test strip Use as instructed 02/20/20   Gildardo Pounds, NP  lisinopril (ZESTRIL) 10 MG tablet Take 1 tablet (10 mg total) by mouth daily. 02/20/20   Gildardo Pounds, NP  metFORMIN (GLUCOPHAGE) 1000 MG tablet Take 1 tablet (1,000 mg total) by mouth 2 (two) times daily. 05/08/20   Eustaquio Maize, PA-C  metFORMIN (GLUCOPHAGE) 500 MG tablet Take 2 tablets (1,000 mg total) by mouth 2 (two) times daily with a meal. 02/20/20 05/20/20  Gildardo Pounds, NP  sertraline (ZOLOFT) 50 MG tablet  Take 1 tablet (50 mg total) by mouth daily. 05/05/20   Jaynee Eagles, PA-C  TRUEplus Lancets 28G MISC 1 each by Does not apply route 2 (two) times daily. 02/20/20   Gildardo Pounds, NP    Allergies    Patient has no known allergies.  Review of Systems   Review of Systems  Constitutional: Negative for chills and fever.  Eyes: Negative for visual disturbance.  Respiratory: Negative for shortness of breath.   Cardiovascular: Negative for chest pain.  Neurological: Negative for dizziness and light-headedness.  Psychiatric/Behavioral: Positive for sleep disturbance. Negative for hallucinations, self-injury and suicidal ideas.  All other systems reviewed and are negative.   Physical Exam Updated Vital Signs BP (!) 116/60 (BP Location: Left Arm)   Pulse 66   Temp 98.4 F (36.9 C) (Oral)   Resp 18   SpO2 98%   Physical Exam Vitals and nursing note reviewed.  Constitutional:      Appearance: She is not ill-appearing or diaphoretic.     Comments: Pt resting comfortably with eyes closed   HENT:     Head: Normocephalic and atraumatic.  Eyes:     Conjunctiva/sclera: Conjunctivae normal.  Cardiovascular:     Rate and Rhythm: Normal rate and regular rhythm.     Pulses: Normal pulses.  Pulmonary:     Effort: Pulmonary effort is normal.     Breath sounds: Normal breath sounds. No wheezing, rhonchi or rales.  Abdominal:     Palpations: Abdomen is soft.     Tenderness: There is no abdominal tenderness.  Musculoskeletal:     Cervical back: Neck supple.  Skin:    General: Skin is warm and dry.  Neurological:     Mental Status: She is alert.  Psychiatric:        Mood and Affect: Mood normal.        Behavior: Behavior normal.     ED Results / Procedures / Treatments   Labs (all labs ordered are listed, but only abnormal results are displayed) Labs Reviewed - No data to display  EKG None  Radiology No results found.  Procedures Procedures (including critical care  time)  Medications Ordered in ED Medications - No data to display  ED Course  I have reviewed the triage vital signs and the nursing notes.  Pertinent labs & imaging results that were available during my care of the patient were reviewed by me and considered in my medical decision making (see chart for details).    MDM Rules/Calculators/A&P  49 year old female who presents to the ED today complaining of insomnia x3 days since starting Zoloft.  Started 50 mg of Zoloft 4 days ago, was told yesterday at urgent care to half dose to 25 mg and started doing this today. Also taking hydroxyzine without relief.  Patient states she is concerned she is having difficulty sleeping.  She denies any physical symptoms related to lack of sleep.  On arrival to the ED patient is afebrile, nontachycardic nontachypneic.  She appears to be in no acute distress.  When I arrive to the bed patient is in comfortably with her eyes closed and appears to be half asleep.  She is easily arousable.  And again denies any other symptoms.  No SI, HI, AVH.  I do suspect that patient's body is still getting used to the Zoloft at half dose of 25 mg that she only started taking this today.  Have encouraged that she continue to do this for the next couple days with addition to the half dose of the hydroxyzine and to follow-up with her PCP on Monday.  She states that she has an appointment with her PCP on August 6 however is unsure if they can see her earlier, I have advised that she call Monday to see if they can see her sooner for further eval.  Patient instructed to return to the ED for any worsening symptoms including chest pain, shortness of breath, vision changes, hallucinations, dizziness, lightheadedness, any other new/concerning symptoms.  She is in agreement with this plan.  She is requesting refill of her Metformin today, will refill and sent to pharmacy of her choosing.  Patient stable for discharge.    This note was prepared using Dragon voice recognition software and may include unintentional dictation errors due to the inherent limitations of voice recognition software.  Final Clinical Impression(s) / ED Diagnoses Final diagnoses:  Drug-induced insomnia ([place])    Rx / DC Orders ED Discharge Orders         Ordered    metFORMIN (GLUCOPHAGE) 1000 MG tablet  2 times daily     Discontinue  Reprint     05/08/20 1545           Discharge Instructions     Please call your PCP Monday to see if they can see you earlier than August 6th.   I would recommend continue taking the Zoloft at half dosage for the next couple of days in the morning and to continue taking the Hydroxyzine at half dose for the next couple of nights to see if this will help you sleep. If no improvement you can increase the hydroxyzine to a full tablet.   I have re-prescribed your Metformin at your request. The medication does appear to be the cheapest at [facility] ($4) and therefore I have printed it out for you. Please take to any [facility] of your choosing.   Return to the ED IMMEDIATELY for any worsening symptoms including visual or auditory hallucinations, blurry vision, double vision, chest pain, shortness of breath, dizziness/lightheadedness, or any other new/concerning symptoms.        Eustaquio Maize, PA-C 05/08/20 1546    Isla Pence, MD 05/08/20 [phone]

## 2020-05-08 NOTE — ED Triage Notes (Signed)
Pt arrives to ED w/ c/o insomnia. Pt states she received anxiety medication at [facility] but has been unable to sleep since starting the medication. States she has not been able to sleep for 3 days.

## 2020-05-08 NOTE — ED Provider Notes (Signed)
[place]    CSN: [id number] Arrival date & time: 05/07/20  1336      History   Chief Complaint Chief Complaint  Patient presents with  . Insomnia    HPI Rebecca Sharp is a 49 y.o. female.   Rebecca Sharp presents with complaints of insomnia. States that the past two nights she has not been able to sleep, slept 5 hours two nights ago, and only 3 hours last night. She feels tired, but cannot sleep. She has been anxious, and was seen here on 7/21, started on zoloft, 64m, which she started the day her insomnia started. She still has mild anxiety. Has been given vistaril in the past, she has not been using, she was uncertain if she could take both of these medications. She has a PCP appointment set up in the next few weeks. No other new complaints- no palpitations, no fevers, no SI.   Spanish video interpreter used to collect history and physical exam.    ROS per HPI, negative if not otherwise mentioned.      Past Medical History:  Diagnosis Date  . AMA (advanced maternal age) multigravida 381+  . Anemia   . Ganglion cyst 01/25/2017   Left ankle  . Kidney stones 2013   With pregnancy   . Language barrier   . Obese   . Obesity 01/25/2017  . Prediabetes 02/17/2016  . Pregnancy induced hypertension    at end of last 2 pregnancies    Patient Active Problem List   Diagnosis Date Noted  . Obesity 01/25/2017  . Ganglion cyst 01/25/2017  . Prediabetes 02/17/2016  . Essential hypertension 08/19/2015  . Kidney stones     Past Surgical History:  Procedure Laterality Date  . NO PAST SURGERIES      OB History    Gravida  3   Para  3   Term  3   Preterm  0   AB  0   Living  3     SAB  0   TAB  0   Ectopic  0   Multiple  0   Live Births  3            Home Medications    Prior to Admission medications   Medication Sig Start Date End Date Taking? Authorizing Provider  atorvastatin (LIPITOR) 20 MG tablet Take 1 tablet (20  mg total) by mouth daily. 02/20/20 05/20/20  FGildardo Pounds NP  Blood Glucose Monitoring Suppl (TRUE METRIX METER) w/Device KIT 1 each by Does not apply route 2 (two) times daily. 08/21/19   MArgentina Donovan PA-C  ferrous sulfate 325 (65 FE) MG EC tablet Take 1 tablet (325 mg total) by mouth every other day. 01/09/20 04/08/20  STruett Mainland DO  glucose blood (TRUE METRIX BLOOD GLUCOSE TEST) test strip Use as instructed 02/20/20   FGildardo Pounds NP  lisinopril (ZESTRIL) 10 MG tablet Take 1 tablet (10 mg total) by mouth daily. 02/20/20   FGildardo Pounds NP  metFORMIN (GLUCOPHAGE) 1000 MG tablet Take 1 tablet (1,000 mg total) by mouth 2 (two) times daily. 05/08/20   VEustaquio Maize PA-C  metFORMIN (GLUCOPHAGE) 500 MG tablet Take 2 tablets (1,000 mg total) by mouth 2 (two) times daily with a meal. 02/20/20 05/20/20  FGildardo Pounds NP  sertraline (ZOLOFT) 50 MG tablet Take 1 tablet (50 mg total) by mouth daily. 05/05/20   MJaynee Eagles PA-C  TRUEplus Lancets 28G  MISC 1 each by Does not apply route 2 (two) times daily. 02/20/20   Gildardo Pounds, NP    Family History Family History  Problem Relation Age of Onset  . Diabetes Father   . Hypertension Father     Social History Social History   Tobacco Use  . Smoking status: Never Smoker  . Smokeless tobacco: Never Used  Vaping Use  . Vaping Use: Never used  Substance Use Topics  . Alcohol use: No    Alcohol/week: 0.0 standard drinks  . Drug use: No     Allergies   Patient has no known allergies.   Review of Systems Review of Systems   Physical Exam Triage Vital Signs ED Triage Vitals  Enc Vitals Group     BP 05/07/20 1530 (!) 126/88     Pulse Rate 05/07/20 1530 75     Resp 05/07/20 1530 15     Temp 05/07/20 1530 98.4 F (36.9 C)     Temp src --      SpO2 05/07/20 1530 99 %     Weight --      Height --      Head Circumference --      Peak Flow --      Pain Score 05/07/20 1529 0     Pain Loc --      Pain Edu? --       Excl. in [place]? --    No data found.  Updated Vital Signs BP (!) 126/88   Pulse 75   Temp 98.4 F (36.9 C)   Resp 15   SpO2 99%   Visual Acuity Right Eye Distance:   Left Eye Distance:   Bilateral Distance:    Right Eye Near:   Left Eye Near:    Bilateral Near:     Physical Exam Constitutional:      General: She is not in acute distress.    Appearance: She is well-developed.  Cardiovascular:     Rate and Rhythm: Normal rate.  Pulmonary:     Effort: Pulmonary effort is normal.  Skin:    General: Skin is warm and dry.  Neurological:     Mental Status: She is alert and oriented to person, place, and time.  Psychiatric:        Mood and Affect: Mood normal.        Behavior: Behavior normal.        Thought Content: Thought content normal.      UC Treatments / Results  Labs (all labs ordered are listed, but only abnormal results are displayed) Labs Reviewed - No data to display  EKG   Radiology No results found.  Procedures Procedures (including critical care time)  Medications Ordered in UC Medications - No data to display  Initial Impression / Assessment and Plan / UC Course  I have reviewed the triage vital signs and the nursing notes.  Pertinent labs & imaging results that were available during my care of the patient were reviewed by me and considered in my medical decision making (see chart for details).     Only two days of zoloft, started for anxiety. Insomnia now. Likely side-effect from medication. She was started at 63m, will try increasing up to this, so recommended taking 265mfor the next week and then increasing up to the 50, to try to minimize side effects. Recommended to continue to take in order to adjust to the medication and decrease side effects. Encouraged follow up  with PCP for recheck and long term management. Patient verbalized understanding and agreeable to plan.   Final Clinical Impressions(s) / UC Diagnoses   Final diagnoses:    Anxiety  Insomnia, unspecified type  Medication side effect     Discharge Instructions     For the next 1 week take 1/2 tablet of Sertraline. after 1 week you may restart taking a full 1 tablet.  you may take Hydroxizine at night to help with sleep.  please follow up with your primary care provider for recheck and medication adjustments.  sometimes it can take even up to a month for this medication to be best tolerated.    Durante la prxima semana, tome 1/2 tableta de sertralina. despus de 1 semana, puede volver a tomar 1 comprimido completo. puede tomar hidroxizina por la noche para ayudarlo a dormir. por favor, haga un seguimiento con su proveedor de atencin primaria para volver a verificar y Chief Operating Officer. A veces, este medicamento puede tardar hasta un mes en tolerarse mejor.   ED Prescriptions    None     PDMP not reviewed this encounter.   Zigmund Gottron, NP 05/08/20 2202

## 2020-05-11 ENCOUNTER — Ambulatory Visit
Admission: RE | Admit: 2020-05-11 | Discharge: 2020-05-11 | Disposition: A | Discharge status: Home / Self Care | Payer: No Typology Code available for payment source | Source: Ambulatory Visit | Attending: Family Medicine | Admitting: Family Medicine

## 2020-05-11 ENCOUNTER — Other Ambulatory Visit: Payer: Self-pay

## 2020-05-11 DIAGNOSIS — Z1231 Encounter for screening mammogram for malignant neoplasm of breast: Secondary | ICD-10-CM

## 2020-05-13 MED FILL — METFORMIN HCL 500 MG TABS: 500 | 30 days supply | Qty: 120 | Fill #2

## 2020-05-21 ENCOUNTER — Other Ambulatory Visit: Payer: Self-pay

## 2020-05-21 ENCOUNTER — Other Ambulatory Visit: Payer: Self-pay | Admitting: Nurse Practitioner

## 2020-05-21 ENCOUNTER — Ambulatory Visit: Payer: Self-pay | Attending: Nurse Practitioner | Admitting: Nurse Practitioner

## 2020-05-21 ENCOUNTER — Encounter: Payer: Self-pay | Admitting: Nurse Practitioner

## 2020-05-21 VITALS — BP 111/73 | HR 73 | Temp 97.7°F | Ht 59.0 in | Wt 172.6 lb

## 2020-05-21 DIAGNOSIS — D649 Anemia, unspecified: Secondary | ICD-10-CM

## 2020-05-21 DIAGNOSIS — L80 Vitiligo: Secondary | ICD-10-CM

## 2020-05-21 DIAGNOSIS — E1165 Type 2 diabetes mellitus with hyperglycemia: Secondary | ICD-10-CM

## 2020-05-21 DIAGNOSIS — F419 Anxiety disorder, unspecified: Secondary | ICD-10-CM

## 2020-05-21 LAB — GLUCOSE, POCT (MANUAL RESULT ENTRY): POC Glucose: 113 mg/dl — AB (ref 70–99)

## 2020-05-21 LAB — POCT GLYCOSYLATED HEMOGLOBIN (HGB A1C): Hemoglobin A1C: 5.2 % (ref 4.0–5.6)

## 2020-05-21 MED ORDER — METFORMIN HCL 500 MG PO TABS: 1000.0000 mg | ORAL_TABLET | Freq: Two times a day (BID) | ORAL | 0 refills | Status: DC

## 2020-05-21 MED ORDER — FERROUS SULFATE 325 (65 FE) MG PO TBEC: 325.0000 mg | DELAYED_RELEASE_TABLET | ORAL | 1 refills | Status: DC

## 2020-05-21 MED ORDER — BETAMETHASONE DIPROPIONATE 0.05 % EX CREA: TOPICAL_CREAM | Freq: Two times a day (BID) | CUTANEOUS | 1 refills | Status: DC

## 2020-05-21 MED FILL — BETAMETHASONE DP 0.05% CRM: 0.05 | 7 days supply | Qty: 30 | Fill #0

## 2020-05-21 MED FILL — FERROUS SULFATE 325 MG TAB: 325 (65 FE) | 30 days supply | Qty: 15 | Fill #0

## 2020-05-21 NOTE — Progress Notes (Signed)
Assessment & Plan:  Rebecca Sharp was seen today for follow-up.  Diagnoses and all orders for this visit:  Type 2 diabetes mellitus with hyperglycemia, without long-term current use of insulin (HCC) -     Glucose (CBG) -     HgB A1c -     Microalbumin/Creatinine Ratio, Urine -     Basic metabolic panel -     metFORMIN (GLUCOPHAGE) 500 MG tablet; Take 2 tablets (1,000 mg total) by mouth 2 (two) times daily with a meal. -     atorvastatin (LIPITOR) 20 MG tablet; Take 1 tablet (20 mg total) by mouth daily. Continue blood sugar control as discussed in office today, low carbohydrate diet, and regular physical exercise as tolerated, 150 minutes per week (30 min each day, 5 days per week, or 50 min 3 days per week). Keep blood sugar logs with fasting goal of 90-130 mg/dl, post prandial (after you eat) less than 180.  For Hypoglycemia: BS <60 and Hyperglycemia BS >400; contact the clinic ASAP. Annual eye exams and foot exams are recommended.   Vitiligo -     betamethasone, augmented, (DIPROLENE) 0.05 % lotion; Apply topically 2 (two) times daily.  Anemia, unspecified type -     ferrous sulfate 325 (65 FE) MG EC tablet; Take 1 tablet (325 mg total) by mouth every other day.   Patient has been counseled on age-appropriate routine health concerns for screening and prevention. These are reviewed and up-to-date. Referrals have been placed accordingly. Immunizations are up-to-date or declined.    Subjective:   Chief Complaint  Patient presents with  . Follow-up    Pt. is here for diabetes follow up.   HPI Rebecca Sharp 49 y.o. female presents to office today for DM and Hosptial follow up.  PMH: DM TYPE 2, anxiety and Depression, Anemia, Ganglion cyst (01/25/2017), Kidney stones (2013) VRI was used to communicate directly with patient for the entire encounter including providing detailed patient instructions.   DM TYPE 2 Well controlled with metformin 1000 mg BID. Overdue for eye exam.  Referral placed today. She is on ACE and STATIN per ADA guidelines. She is monitoring her blood glucose daily. Average readings <150.  Lab Results  Component Value Date   HGBA1C 5.2 05/21/2020   Anxiety She was seen in the ED 3 separate times for anxiety symptoms including chest pain and insomnia. She was started on Zoloft 50 mg 7-21 and stated her insomnia started the same day.  She has also been prescribed hydroxyzine in the past but never has taken. She was seen in the ED on  [id number] with anxiety and persistent insomnia after only being on the zoloft for 2 days. At that time she was instructed to decrease the zoloft to 25 mg and continue hydroxyzine. She went back to the ED on 05-08-2020 with the same concerns of insomnia. Work up was negative. She was instructed to continue to zoloft until she had her appt today with me.   Today she states she is still not sleeping well. I have encouraged her to try the zoloft for at least 2 full weeks to see if her symptoms improve. She should take the zoloft during the day and can take hydroxyzine at night and as needed during the day.  GAD 7 : Generalized Anxiety Score 03/08/2020 07/03/2018 05/15/2018 04/03/2018  Nervous, Anxious, on Edge 2 0 0 0  Control/stop worrying 2 0 0 0  Worry too much - different things 0 0 0 0  Trouble relaxing 0 0 0 0  Restless 0 0 0 0  Easily annoyed or irritable 2 0 0 0  Afraid - awful might happen 0 0 0 0  Total GAD 7 Score 6 0 0 0      Review of Systems  Constitutional: Negative for fever, malaise/fatigue and weight loss.  HENT: Negative.  Negative for nosebleeds.   Eyes: Negative.  Negative for blurred vision, double vision and photophobia.  Respiratory: Negative.  Negative for cough and shortness of breath.   Cardiovascular: Negative.  Negative for chest pain, palpitations and leg swelling.  Gastrointestinal: Negative.  Negative for heartburn, nausea and vomiting.  Musculoskeletal: Negative.  Negative for myalgias.    Neurological: Negative.  Negative for dizziness, focal weakness, seizures and headaches.  Psychiatric/Behavioral: Negative for suicidal ideas. The patient is nervous/anxious and has insomnia.     Past Medical History:  Diagnosis Date  . AMA (advanced maternal age) multigravida 25+   . Anemia   . Ganglion cyst 01/25/2017   Left ankle  . Kidney stones 2013   With pregnancy   . Language barrier   . Obese   . Obesity 01/25/2017  . Prediabetes 02/17/2016  . Pregnancy induced hypertension    at end of last 2 pregnancies    Past Surgical History:  Procedure Laterality Date  . NO PAST SURGERIES      Family History  Problem Relation Age of Onset  . Diabetes Father   . Hypertension Father     Social History Reviewed with no changes to be made today.   Outpatient Medications Prior to Visit  Medication Sig Dispense Refill  . Blood Glucose Monitoring Suppl (TRUE METRIX METER) w/Device KIT 1 each by Does not apply route 2 (two) times daily. 1 kit 0  . glucose blood (TRUE METRIX BLOOD GLUCOSE TEST) test strip Use as instructed 100 each 12  . lisinopril (ZESTRIL) 10 MG tablet Take 1 tablet (10 mg total) by mouth daily. 90 tablet 3  . sertraline (ZOLOFT) 50 MG tablet Take 1 tablet (50 mg total) by mouth daily. (Patient taking differently: Take 25 mg by mouth daily. ) 90 tablet 0  . TRUEplus Lancets 28G MISC 1 each by Does not apply route 2 (two) times daily. 100 each 2  . atorvastatin (LIPITOR) 20 MG tablet Take 1 tablet (20 mg total) by mouth daily. 90 tablet 1  . ferrous sulfate 325 (65 FE) MG EC tablet Take 1 tablet (325 mg total) by mouth every other day. 180 tablet 1  . metFORMIN (GLUCOPHAGE) 1000 MG tablet Take 1 tablet (1,000 mg total) by mouth 2 (two) times daily. (Patient not taking: Reported on 05/21/2020) 30 tablet 0  . metFORMIN (GLUCOPHAGE) 500 MG tablet Take 2 tablets (1,000 mg total) by mouth 2 (two) times daily with a meal. 360 tablet 0   No facility-administered medications  prior to visit.    No Known Allergies     Objective:    BP 111/73 (BP Location: Right Arm, Patient Position: Sitting, Cuff Size: Normal)   Pulse 73   Temp 97.7 F (36.5 C) (Temporal)   Ht 4' 11"  (1.499 m)   Wt 172 lb 9.6 oz (78.3 kg)   LMP 05/01/2020   SpO2 99%   BMI 34.86 kg/m  Wt Readings from Last 3 Encounters:  05/21/20 172 lb 9.6 oz (78.3 kg)  01/09/20 175 lb 1.6 oz (79.4 kg)  09/26/19 189 lb (85.7 kg)    Physical Exam Vitals and nursing  note reviewed.  Constitutional:      Appearance: She is well-developed.  HENT:     Head: Normocephalic and atraumatic.  Cardiovascular:     Rate and Rhythm: Normal rate and regular rhythm.     Heart sounds: Normal heart sounds. No murmur heard.  No friction rub. No gallop.   Pulmonary:     Effort: Pulmonary effort is normal. No tachypnea or respiratory distress.     Breath sounds: Normal breath sounds. No decreased breath sounds, wheezing, rhonchi or rales.  Chest:     Chest wall: No tenderness.  Abdominal:     General: Bowel sounds are normal.     Palpations: Abdomen is soft.  Musculoskeletal:        General: Normal range of motion.     Cervical back: Normal range of motion.  Skin:    General: Skin is warm and dry.       Neurological:     Mental Status: She is alert and oriented to person, place, and time.     Coordination: Coordination normal.  Psychiatric:        Behavior: Behavior normal. Behavior is cooperative.        Thought Content: Thought content normal.        Judgment: Judgment normal.          Patient has been counseled extensively about nutrition and exercise as well as the importance of adherence with medications and regular follow-up. The patient was given clear instructions to go to ER or return to medical center if symptoms don't improve, worsen or new problems develop. The patient verbalized understanding.   Follow-up: Return in about 3 months (around 08/21/2020).   Gildardo Pounds, FNP-BC [facility] and [facility] [place], [place]   05/22/2020, 11:27 AM

## 2020-05-22 ENCOUNTER — Encounter: Payer: Self-pay | Admitting: Nurse Practitioner

## 2020-05-22 LAB — BASIC METABOLIC PANEL
BUN/Creatinine Ratio: 15 (ref 9–23)
BUN: 12 mg/dL (ref 6–24)
CO2: 21 mmol/L (ref 20–29)
Calcium: 9.2 mg/dL (ref 8.7–10.2)
Chloride: 104 mmol/L (ref 96–106)
Creatinine, Ser: 0.79 mg/dL (ref 0.57–1.00)
GFR calc Af Amer: 103 mL/min/{1.73_m2} (ref >59)
GFR calc non Af Amer: 89 mL/min/{1.73_m2} (ref >59)
Glucose: 89 mg/dL (ref 65–99)
Potassium: 4.3 mmol/L (ref 3.5–5.2)
Sodium: 140 mmol/L (ref 134–144)

## 2020-05-22 LAB — MICROALBUMIN / CREATININE URINE RATIO
Creatinine, Urine: 116 mg/dL
Microalb/Creat Ratio: 37 mg/g creat — ABNORMAL HIGH (ref 0–29)
Microalbumin, Urine: 43.3 ug/mL

## 2020-05-22 MED ORDER — BETAMETHASONE DIPROPIONATE 0.05 % EX CREA: TOPICAL_CREAM | Freq: Two times a day (BID) | CUTANEOUS | 1 refills | Status: DC

## 2020-05-22 MED ORDER — BETAMETHASONE DIPROPIONATE AUG 0.05 % EX LOTN: TOPICAL_LOTION | Freq: Two times a day (BID) | CUTANEOUS | 1 refills | Status: DC

## 2020-05-22 MED ORDER — BETAMETHASONE DIPROPIONATE 0.05 % EX OINT: TOPICAL_OINTMENT | Freq: Two times a day (BID) | CUTANEOUS | 0 refills | Status: DC

## 2020-05-22 MED ORDER — ATORVASTATIN CALCIUM 20 MG PO TABS: 20.0000 mg | ORAL_TABLET | Freq: Every day | ORAL | 1 refills | Status: DC

## 2020-05-24 MED FILL — TRUE METRIX TEST STRIP: 25 days supply | Qty: 100 | Fill #5

## 2020-05-24 MED FILL — ?ATORVASTATIN 20 MG TABLET: 20 | 30 days supply | Qty: 30 | Fill #0

## 2020-05-25 MED FILL — LISINOPRIL 10 MG TABS: 10 | 30 days supply | Qty: 30 | Fill #9

## 2020-05-26 MED FILL — BETAMETHASONE DP AUG 0.05%: 0.05 | 18 days supply | Qty: 60 | Fill #0

## 2020-06-01 ENCOUNTER — Ambulatory Visit: Payer: Self-pay

## 2020-06-01 ENCOUNTER — Other Ambulatory Visit: Payer: Self-pay

## 2020-06-01 MED FILL — TRUEplus LANCETS 28G MISC: 50 days supply | Qty: 100 | Fill #1

## 2020-06-01 MED FILL — SERTRALINE HCL 50 MG TABLET: 50 | 30 days supply | Qty: 30 | Fill #1

## 2020-06-28 MED FILL — FERROUS SULFATE 325 MG TAB: 325 (65 FE) | 30 days supply | Qty: 15 | Fill #1

## 2020-06-28 MED FILL — ?METFORMIN HCL 500MG TABL: 500 | 30 days supply | Qty: 120 | Fill #0

## 2020-06-28 MED FILL — LISINOPRIL 10 MG TABS: 10 | 30 days supply | Qty: 30 | Fill #10

## 2020-07-07 ENCOUNTER — Ambulatory Visit: Payer: Self-pay | Admitting: Emergency Medicine

## 2020-07-28 MED FILL — LISINOPRIL 10 MG TABS: 10 | 30 days supply | Qty: 30 | Fill #11

## 2020-07-28 MED FILL — FERROUS SULFATE 325 MG TAB: 325 (65 FE) | 30 days supply | Qty: 15 | Fill #2

## 2020-08-23 ENCOUNTER — Other Ambulatory Visit: Payer: Self-pay

## 2020-08-23 ENCOUNTER — Ambulatory Visit: Payer: Self-pay | Attending: Nurse Practitioner | Admitting: Nurse Practitioner

## 2020-08-23 MED FILL — ?METFORMIN HCL 500MG TABL: 500 | 30 days supply | Qty: 120 | Fill #1

## 2020-08-24 ENCOUNTER — Other Ambulatory Visit: Payer: Self-pay | Admitting: Nurse Practitioner

## 2020-08-24 ENCOUNTER — Other Ambulatory Visit: Payer: Self-pay

## 2020-08-24 DIAGNOSIS — I10 Essential (primary) hypertension: Secondary | ICD-10-CM

## 2020-08-24 MED ORDER — LISINOPRIL 10 MG PO TABS: 10.0000 mg | ORAL_TABLET | Freq: Every day | ORAL | 3 refills | Status: DC

## 2020-08-24 MED FILL — BETAMETHASONE DP 0.05% CRM: 0.05 | 30 days supply | Qty: 15 | Fill #1

## 2020-08-24 MED FILL — LISINOPRIL 10 MG TABS: 10 | 30 days supply | Qty: 30 | Fill #0

## 2020-08-24 NOTE — Telephone Encounter (Signed)
Pt. Request medication refill for her Lisinopril refill. RX sent.

## 2020-08-26 ENCOUNTER — Ambulatory Visit (HOSPITAL_COMMUNITY)
Admission: EM | Admit: 2020-08-26 | Discharge: 2020-08-26 | Disposition: A | Discharge status: Home / Self Care | Payer: Self-pay | Attending: Urgent Care | Admitting: Urgent Care

## 2020-08-26 ENCOUNTER — Other Ambulatory Visit (HOSPITAL_COMMUNITY): Payer: Self-pay | Admitting: Urgent Care

## 2020-08-26 ENCOUNTER — Encounter (HOSPITAL_COMMUNITY): Payer: Self-pay

## 2020-08-26 DIAGNOSIS — E860 Dehydration: Secondary | ICD-10-CM

## 2020-08-26 DIAGNOSIS — M542 Cervicalgia: Secondary | ICD-10-CM

## 2020-08-26 DIAGNOSIS — R258 Other abnormal involuntary movements: Secondary | ICD-10-CM

## 2020-08-26 DIAGNOSIS — R519 Headache, unspecified: Secondary | ICD-10-CM

## 2020-08-26 DIAGNOSIS — D649 Anemia, unspecified: Secondary | ICD-10-CM

## 2020-08-26 LAB — POCT URINALYSIS DIPSTICK, ED / UC
Bilirubin Urine: NEGATIVE
Glucose, UA: NEGATIVE mg/dL
Ketones, ur: NEGATIVE mg/dL
Nitrite: NEGATIVE
Protein, ur: NEGATIVE mg/dL
Specific Gravity, Urine: 1.015 (ref 1.005–1.030)
Urobilinogen, UA: 0.2 mg/dL (ref 0.0–1.0)
pH: 6 (ref 5.0–8.0)

## 2020-08-26 LAB — CBG MONITORING, ED: Glucose-Capillary: 113 mg/dL — ABNORMAL HIGH (ref 70–99)

## 2020-08-26 MED ORDER — NAPROXEN 500 MG PO TABS: 500.0000 mg | ORAL_TABLET | Freq: Two times a day (BID) | ORAL | 0 refills | Status: DC

## 2020-08-26 MED ORDER — TIZANIDINE HCL 4 MG PO TABS: 4.0000 mg | ORAL_TABLET | Freq: Three times a day (TID) | ORAL | 0 refills | Status: DC | PRN

## 2020-08-26 NOTE — ED Provider Notes (Signed)
[facility]   MRN: [id number] DOB: 1971/01/18  Subjective:   Rebecca Sharp is a 49 y.o. female presenting for 53-monthhistory of persistent intermittent neck pain worse over the right side.  Has also had episodes of chills and shakes.  Admits significant anxiety about her health.  Has PCP and has gone for regular work-up.  Has only been found to have anemia, has been referred to a specialist for this.  Denies fever, headache, confusion, chest pain, shortness of breath, nausea, vomiting, belly pain, bruising, bleeding.  Patient has not used medications for relief.  She works at [employer] has long hours and stands the majority of her time.  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  States that she tries to be purposeful about using her neck and doing neck exercises but is worried that something else is wrong.  Does not hydrate with water consistently, does about 2-3 servings of water per day.  Denies weakness, numbness or tingling, radicular symptoms.  No current facility-administered medications for this encounter.  Current Outpatient Medications:  .  Zinc 100 MG TABS, Take by mouth., Disp: , Rfl:  .  atorvastatin (LIPITOR) 20 MG tablet, Take 1 tablet (20 mg total) by mouth daily., Disp: 90 tablet, Rfl: 1 .  betamethasone, augmented, (DIPROLENE) 0.05 % lotion, Apply topically 2 (two) times daily., Disp: 100 mL, Rfl: 1 .  Blood Glucose Monitoring Suppl (TRUE METRIX METER) w/Device KIT, 1 each by Does not apply route 2 (two) times daily., Disp: 1 kit, Rfl: 0 .  ferrous sulfate 325 (65 FE) MG EC tablet, Take 1 tablet (325 mg total) by mouth every other day., Disp: 180 tablet, Rfl: 1 .  glucose blood (TRUE METRIX BLOOD GLUCOSE TEST) test strip, Use as instructed, Disp: 100 each, Rfl: 12 .  lisinopril (ZESTRIL) 10 MG tablet, Take 1 tablet (10 mg total) by mouth daily., Disp: 90 tablet, Rfl: 3 .  metFORMIN (GLUCOPHAGE) 500 MG tablet, Take 2 tablets  (1,000 mg total) by mouth 2 (two) times daily with a meal., Disp: 360 tablet, Rfl: 0 .  sertraline (ZOLOFT) 50 MG tablet, Take 1 tablet (50 mg total) by mouth daily. (Patient taking differently: Take 25 mg by mouth daily. ), Disp: 90 tablet, Rfl: 0 .  TRUEplus Lancets 28G MISC, 1 each by Does not apply route 2 (two) times daily., Disp: 100 each, Rfl: 2   No Known Allergies  Past Medical History:  Diagnosis Date  . AMA (advanced maternal age) multigravida 392+  . Anemia   . Ganglion cyst 01/25/2017   Left ankle  . Kidney stones 2013   With pregnancy   . Language barrier   . Obese   . Obesity 01/25/2017  . Prediabetes 02/17/2016  . Pregnancy induced hypertension    at end of last 2 pregnancies     Past Surgical History:  Procedure Laterality Date  . NO PAST SURGERIES      Family History  Problem Relation Age of Onset  . Diabetes Father   . Hypertension Father     Social History   Tobacco Use  . Smoking status: Never Smoker  . Smokeless tobacco: Never Used  Vaping Use  . Vaping Use: Never used  Substance Use Topics  . Alcohol use: No    Alcohol/week: 0.0 standard drinks  . Drug use: No    ROS   Objective:   Vitals: BP 131/86 (BP Location: Right Arm)  Pulse 82   Temp 98.4 F (36.9 C) (Oral)   Resp 19   LMP 08/20/2020 (Exact Date)   SpO2 100%   Physical Exam Constitutional:      General: She is not in acute distress.    Appearance: Normal appearance. She is well-developed. She is not ill-appearing, toxic-appearing or diaphoretic.  HENT:     Head: Normocephalic and atraumatic.     Nose: Nose normal.     Mouth/Throat:     Mouth: Mucous membranes are moist.     Pharynx: Oropharynx is clear.  Eyes:     General: No scleral icterus.    Extraocular Movements: Extraocular movements intact.     Pupils: Pupils are equal, round, and reactive to light.  Cardiovascular:     Rate and Rhythm: Normal rate.  Pulmonary:     Effort: Pulmonary effort is normal.    Musculoskeletal:     Cervical back: Spasms and tenderness (Along either side of paraspinal muscles at the cervical level, trapezius as well) present. No swelling, edema, deformity, erythema, signs of trauma, lacerations, rigidity, torticollis, bony tenderness or crepitus. Pain with movement present. Normal range of motion.  Skin:    General: Skin is warm and dry.  Neurological:     General: No focal deficit present.     Mental Status: She is alert and oriented to person, place, and time.     Cranial Nerves: No cranial nerve deficit.     Motor: No weakness.     Deep Tendon Reflexes: Reflexes normal.  Psychiatric:        Mood and Affect: Mood is anxious. Mood is not depressed.        Speech: Speech normal.        Behavior: Behavior normal. Behavior is not agitated.     Results for orders placed or performed during the hospital encounter of 08/26/20 (from the past 24 hour(s))  POC CBG monitoring     Status: Abnormal   Collection Time: 08/26/20  6:05 PM  Result Value Ref Range   Glucose-Capillary 113 (H) 70 - 99 mg/dL  POC Urinalysis dipstick     Status: Abnormal   Collection Time: 08/26/20  6:06 PM  Result Value Ref Range   Glucose, UA NEGATIVE NEGATIVE mg/dL   Bilirubin Urine NEGATIVE NEGATIVE   Ketones, ur NEGATIVE NEGATIVE mg/dL   Specific Gravity, Urine 1.015 1.005 - 1.030   Hgb urine dipstick LARGE (A) NEGATIVE   pH 6.0 5.0 - 8.0   Protein, ur NEGATIVE NEGATIVE mg/dL   Urobilinogen, UA 0.2 0.0 - 1.0 mg/dL   Nitrite NEGATIVE NEGATIVE   Leukocytes,Ua TRACE (A) NEGATIVE    Assessment and Plan :   PDMP not reviewed this encounter.  1. Neck pain   2. Anemia, unspecified type   3. Dehydration     Patient has very reassuring physical exam findings, normal vital signs.  Suspect lack of hydration, physical nature of her work as a source of her neck pain.  Recommended conservative management, better hydration, naproxen for pain and inflammation and tizanidine as a muscle  relaxant.  Keep follow-up regarding anemia with her PCP and specialist.  Counseled patient on potential for adverse effects with medications prescribed/recommended today, ER and return-to-clinic precautions discussed, patient verbalized understanding.    Jaynee Eagles, PA-C 08/26/20 1829

## 2020-08-26 NOTE — ED Triage Notes (Signed)
Pt presents with neck pain x 2 months; feeling shaking and sleepy after having lunch around 1530 today. Denies nausea, vomiting, dizziness,

## 2020-08-27 MED FILL — tiZANidine HCL 4 MG TABS: 4 | 10 days supply | Qty: 30 | Fill #0

## 2020-08-27 MED FILL — TRUE METRIX TEST STRIP: 25 days supply | Qty: 100 | Fill #0

## 2020-08-27 MED FILL — TRUEplus LANCETS 28G MISC: 30 days supply | Qty: 100 | Fill #2

## 2020-08-27 MED FILL — ?NAPROXEN 500 MG TABS: 500 | 15 days supply | Qty: 30 | Fill #0

## 2020-09-03 MED FILL — FERROUS SULFATE 325 MG TAB: 325 (65 FE) | 30 days supply | Qty: 15 | Fill #3

## 2020-09-27 ENCOUNTER — Other Ambulatory Visit: Payer: Self-pay

## 2020-09-27 ENCOUNTER — Encounter: Payer: Self-pay | Admitting: Nurse Practitioner

## 2020-09-27 ENCOUNTER — Other Ambulatory Visit: Payer: Self-pay | Admitting: Nurse Practitioner

## 2020-09-27 ENCOUNTER — Ambulatory Visit: Payer: Self-pay | Attending: Nurse Practitioner | Admitting: Nurse Practitioner

## 2020-09-27 VITALS — BP 115/75 | HR 88 | Temp 98.7°F | Ht 59.0 in | Wt 172.8 lb

## 2020-09-27 DIAGNOSIS — F419 Anxiety disorder, unspecified: Secondary | ICD-10-CM

## 2020-09-27 DIAGNOSIS — D500 Iron deficiency anemia secondary to blood loss (chronic): Secondary | ICD-10-CM

## 2020-09-27 DIAGNOSIS — E785 Hyperlipidemia, unspecified: Secondary | ICD-10-CM

## 2020-09-27 DIAGNOSIS — R200 Anesthesia of skin: Secondary | ICD-10-CM

## 2020-09-27 DIAGNOSIS — E1165 Type 2 diabetes mellitus with hyperglycemia: Secondary | ICD-10-CM

## 2020-09-27 LAB — GLUCOSE, POCT (MANUAL RESULT ENTRY): POC Glucose: 114 mg/dl — AB (ref 70–99)

## 2020-09-27 MED ORDER — HYDROXYZINE HCL 25 MG PO TABS: 12.5000 mg | ORAL_TABLET | Freq: Three times a day (TID) | ORAL | 2 refills | Status: DC | PRN

## 2020-09-27 MED ORDER — FERROUS SULFATE 325 (65 FE) MG PO TBEC: 325.0000 mg | DELAYED_RELEASE_TABLET | ORAL | 1 refills | Status: DC

## 2020-09-27 MED FILL — FERROUS SULFATE 325 MG TAB: 325 (65 FE) | 30 days supply | Qty: 15 | Fill #0

## 2020-09-27 MED FILL — LISINOPRIL 10 MG TABS: 10 | 30 days supply | Qty: 30 | Fill #1

## 2020-09-27 MED FILL — hydrOXYzine HCL 25 MG TABS: 25 | 20 days supply | Qty: 60 | Fill #0

## 2020-09-27 NOTE — Progress Notes (Signed)
Assessment & Plan:  Rebecca Sharp was seen today for hand numbness.  Diagnoses and all orders for this visit:  Type 2 diabetes mellitus with hyperglycemia, without long-term current use of insulin (HCC) -     Glucose (CBG) -     Hemoglobin A1c Continue blood sugar control as discussed in office today, low carbohydrate diet, and regular physical exercise as tolerated, 150 minutes per week (30 min each day, 5 days per week, or 50 min 3 days per week). Keep blood sugar logs with fasting goal of 90-130 mg/dl, post prandial (after you eat) less than 180.  For Hypoglycemia: BS <60 and Hyperglycemia BS >400; contact the clinic ASAP. Annual eye exams and foot exams are recommended.   Iron deficiency anemia due to chronic blood loss -     CBC -     Iron, TIBC and Ferritin Panel -     ferrous sulfate 325 (65 FE) MG EC tablet; Take 1 tablet (325 mg total) by mouth every other day.  Bilateral hand numbness Likely positional related. Will continue to monitor for worsening or increased symptoms during the day   Dyslipidemia, goal LDL below 70 INSTRUCTIONS: Work on a low fat, heart healthy diet and participate in regular aerobic exercise program by working out at least 150 minutes per week; 5 days a week-30 minutes per day. Avoid red meat/beef/steak,  fried foods. junk foods, sodas, sugary drinks, unhealthy snacking, alcohol and smoking.  Drink at least 80 oz of water per day and monitor your carbohydrate intake daily.   Anxiety -     hydrOXYzine (ATARAX/VISTARIL) 25 MG tablet; Take 0.5-1 tablets (12.5-25 mg total) by mouth every 8 (eight) hours as needed for anxiety.    Patient has been counseled on age-appropriate routine health concerns for screening and prevention. These are reviewed and up-to-date. Referrals have been placed accordingly. Immunizations are up-to-date or declined.    Subjective:   Chief Complaint  Patient presents with   hand numbness    Pt. Stated whenever she sleeps, her hands  get numb.    HPI Rebecca Sharp 49 y.o. female presents to office today with complaints of bilateral hand numbness which lasts for a few seconds to minutes. She states it only occurs at night when she is lying down. She does sleep with her hands tucked under her head or her pillow.  PMH: Anemia, Ganglion cyst (01/25/2017), Kidney stones (2013),  Prediabetes (02/17/2016), and Pregnancy induced hypertension.  Anemia Taking iron tablets every other day. Endorses increased fatigue.   DM2 Well controlled with metformin 1000 mg BID. Denies any symptoms of hypo or hyperglycemia. LDL not at goal. She is currently prescribed atorvastatin 20 mg daily.  Lab Results  Component Value Date   HGBA1C 5.3 09/27/2020   Lab Results  Component Value Date   LDLCALC 102 (H) 12/02/2019     Review of Systems  Constitutional: Positive for malaise/fatigue. Negative for fever and weight loss.  HENT: Negative.  Negative for nosebleeds.   Eyes: Negative.  Negative for blurred vision, double vision and photophobia.  Respiratory: Negative.  Negative for cough and shortness of breath.   Cardiovascular: Negative.  Negative for chest pain, palpitations and leg swelling.  Gastrointestinal: Negative.  Negative for heartburn, nausea and vomiting.  Musculoskeletal: Negative.  Negative for myalgias.       SEE HPI  Neurological: Positive for sensory change. Negative for dizziness, focal weakness, seizures and headaches.  Psychiatric/Behavioral: Negative for suicidal ideas. The patient is nervous/anxious.  Past Medical History:  Diagnosis Date   AMA (advanced maternal age) multigravida 35+    Anemia    Diabetes mellitus without complication ([place])    Ganglion cyst 01/25/2017   Left ankle   Kidney stones 2013   With pregnancy    Language barrier    Obese    Obesity 01/25/2017   Pregnancy induced hypertension    at end of last 2 pregnancies    Past Surgical History:  Procedure Laterality Date    NO PAST SURGERIES      Family History  Problem Relation Age of Onset   Diabetes Father    Hypertension Father     Social History Reviewed with no changes to be made today.   Outpatient Medications Prior to Visit  Medication Sig Dispense Refill   betamethasone, augmented, (DIPROLENE) 0.05 % lotion Apply topically 2 (two) times daily. 100 mL 1   Blood Glucose Monitoring Suppl (TRUE METRIX METER) w/Device KIT 1 each by Does not apply route 2 (two) times daily. 1 kit 0   glucose blood (TRUE METRIX BLOOD GLUCOSE TEST) test strip Use as instructed 100 each 12   lisinopril (ZESTRIL) 10 MG tablet Take 1 tablet (10 mg total) by mouth daily. 90 tablet 3   metFORMIN (GLUCOPHAGE) 500 MG tablet Take 2 tablets (1,000 mg total) by mouth 2 (two) times daily with a meal. (Patient taking differently: Take 1,000 mg by mouth 2 (two) times daily with a meal. Pt. Is taking one tablet 2x a day.) 360 tablet 0   naproxen (NAPROSYN) 500 MG tablet Take 1 tablet (500 mg total) by mouth 2 (two) times daily with a meal. 30 tablet 0   tiZANidine (ZANAFLEX) 4 MG tablet Take 1 tablet (4 mg total) by mouth every 8 (eight) hours as needed. 30 tablet 0   TRUEplus Lancets 28G MISC 1 each by Does not apply route 2 (two) times daily. 100 each 2   Zinc 100 MG TABS Take by mouth.     atorvastatin (LIPITOR) 20 MG tablet Take 1 tablet (20 mg total) by mouth daily. (Patient not taking: Reported on 09/27/2020) 90 tablet 1   sertraline (ZOLOFT) 50 MG tablet Take 1 tablet (50 mg total) by mouth daily. (Patient not taking: Reported on 09/27/2020) 90 tablet 0   ferrous sulfate 325 (65 FE) MG EC tablet Take 1 tablet (325 mg total) by mouth every other day. 180 tablet 1   No facility-administered medications prior to visit.    No Known Allergies     Objective:    BP 115/75 (BP Location: Left Arm, Patient Position: Sitting, Cuff Size: Normal)    Pulse 88    Temp 98.7 F (37.1 C) (Oral)    Ht _0  (1.499 m)    Wt  172 lb 12.8 oz (78.4 kg)    SpO2 100%    BMI 34.90 kg/m  Wt Readings from Last 3 Encounters:  09/27/20 172 lb 12.8 oz (78.4 kg)  05/21/20 172 lb 9.6 oz (78.3 kg)  01/09/20 175 lb 1.6 oz (79.4 kg)    Physical Exam Vitals and nursing note reviewed.  Constitutional:      Appearance: She is well-developed and well-nourished.  HENT:     Head: Normocephalic and atraumatic.  Eyes:     Extraocular Movements: EOM normal.  Cardiovascular:     Rate and Rhythm: Normal rate and regular rhythm.     Pulses: Intact distal pulses.     Heart sounds: Normal heart sounds.  No murmur heard. No friction rub. No gallop.   Pulmonary:     Effort: Pulmonary effort is normal. No tachypnea or respiratory distress.     Breath sounds: Normal breath sounds. No decreased breath sounds, wheezing, rhonchi or rales.  Chest:     Chest wall: No tenderness.  Abdominal:     General: Bowel sounds are normal.     Palpations: Abdomen is soft.  Musculoskeletal:        General: No swelling, tenderness, deformity, signs of injury or edema. Normal range of motion.     Cervical back: Normal range of motion.     Right lower leg: No edema.     Left lower leg: No edema.  Skin:    General: Skin is warm and dry.  Neurological:     Mental Status: She is alert and oriented to person, place, and time.     Coordination: Coordination normal.  Psychiatric:        Mood and Affect: Mood and affect normal.        Behavior: Behavior normal. Behavior is cooperative.        Thought Content: Thought content normal.        Judgment: Judgment normal.          Patient has been counseled extensively about nutrition and exercise as well as the importance of adherence with medications and regular follow-up. The patient was given clear instructions to go to ER or return to medical center if symptoms don't improve, worsen or new problems develop. The patient verbalized understanding.   Follow-up: Return in about 3 months (around  12/26/2020).   Gildardo Pounds, FNP-BC [facility] and [facility], [place]   10/10/2020, 1:23 PM

## 2020-09-28 LAB — IRON,TIBC AND FERRITIN PANEL
Ferritin: 15 ng/mL (ref 15–150)
Iron Saturation: 9 % — CL (ref 15–55)
Iron: 33 ug/dL (ref 27–159)
Total Iron Binding Capacity: 355 ug/dL (ref 250–450)
UIBC: 322 ug/dL (ref 131–425)

## 2020-09-28 LAB — HEMOGLOBIN A1C
Est. average glucose Bld gHb Est-mCnc: 105 mg/dL
Hgb A1c MFr Bld: 5.3 % (ref 4.8–5.6)

## 2020-09-28 LAB — CBC
Hematocrit: 30.7 % — ABNORMAL LOW (ref 34.0–46.6)
Hemoglobin: 10.2 g/dL — ABNORMAL LOW (ref 11.1–15.9)
MCH: 28.5 pg (ref 26.6–33.0)
MCHC: 33.2 g/dL (ref 31.5–35.7)
MCV: 86 fL (ref 79–97)
Platelets: 244 10*3/uL (ref 150–450)
RBC: 3.58 x10E6/uL — ABNORMAL LOW (ref 3.77–5.28)
RDW: 13.3 % (ref 11.7–15.4)
WBC: 11.2 10*3/uL — ABNORMAL HIGH (ref 3.4–10.8)

## 2020-10-10 ENCOUNTER — Encounter: Payer: Self-pay | Admitting: Nurse Practitioner

## 2020-10-12 ENCOUNTER — Telehealth: Payer: Self-pay | Admitting: Nurse Practitioner

## 2020-10-12 NOTE — Telephone Encounter (Signed)
Copied from CRM [id number]. Topic: General - Other >> Oct 11, 2020 10:24 AM Lyn Hollingshead D wrote: PT returning call for lab results / please advise

## 2020-10-18 NOTE — Telephone Encounter (Signed)
Using [facility] ID [phone], patient given lab results as noted by Bertram Denver, NP on 10/05/20. Patient verbalized understanding. She says she is taking the Ferrous Sulfate as prescribed QOD. She denies having any cold symptoms, fever, cough when blood work was drawn on 09/27/20. Appointment scheduled for repeat labs on Thursday, 10/21/20 at 1530. Lab order will need to be entered.   Claiborne Rigg, NP  10/05/2020 4:00 PM EST Labs show anemia. Make sure you are taking your iron tablets every day as prescribed. White blood count is elevated. Are you currently experiencing any symptoms such as cold, fever, cough? Needs to repeat labs in 1-2 week for CBC with diff

## 2020-10-21 ENCOUNTER — Other Ambulatory Visit: Payer: Self-pay

## 2020-10-21 ENCOUNTER — Ambulatory Visit: Payer: Self-pay | Attending: Nurse Practitioner

## 2020-10-21 DIAGNOSIS — D500 Iron deficiency anemia secondary to blood loss (chronic): Secondary | ICD-10-CM

## 2020-10-22 LAB — CBC WITH DIFFERENTIAL/PLATELET
Basophils Absolute: 0 10*3/uL (ref 0.0–0.2)
Basos: 1 %
EOS (ABSOLUTE): 0.3 10*3/uL (ref 0.0–0.4)
Eos: 3 %
Hematocrit: 29.3 % — ABNORMAL LOW (ref 34.0–46.6)
Hemoglobin: 9.8 g/dL — ABNORMAL LOW (ref 11.1–15.9)
Immature Grans (Abs): 0 10*3/uL (ref 0.0–0.1)
Immature Granulocytes: 0 %
Lymphocytes Absolute: 3 10*3/uL (ref 0.7–3.1)
Lymphs: 34 %
MCH: 28.7 pg (ref 26.6–33.0)
MCHC: 33.4 g/dL (ref 31.5–35.7)
MCV: 86 fL (ref 79–97)
Monocytes Absolute: 0.5 10*3/uL (ref 0.1–0.9)
Monocytes: 5 %
Neutrophils Absolute: 5 10*3/uL (ref 1.4–7.0)
Neutrophils: 57 %
Platelets: 255 10*3/uL (ref 150–450)
RBC: 3.41 x10E6/uL — ABNORMAL LOW (ref 3.77–5.28)
RDW: 13.4 % (ref 11.7–15.4)
WBC: 8.8 10*3/uL (ref 3.4–10.8)

## 2020-10-29 ENCOUNTER — Telehealth (INDEPENDENT_AMBULATORY_CARE_PROVIDER_SITE_OTHER): Payer: Self-pay

## 2020-10-29 ENCOUNTER — Other Ambulatory Visit: Payer: Self-pay | Admitting: Nurse Practitioner

## 2020-10-29 DIAGNOSIS — D500 Iron deficiency anemia secondary to blood loss (chronic): Secondary | ICD-10-CM

## 2020-10-29 DIAGNOSIS — E1165 Type 2 diabetes mellitus with hyperglycemia: Secondary | ICD-10-CM

## 2020-10-29 MED FILL — BETAMETHASONE DP 0.05% CRM: 0.05 | 30 days supply | Qty: 15 | Fill #2

## 2020-10-29 MED FILL — TRUEplus LANCETS 28G MISC: 50 days supply | Qty: 100 | Fill #0

## 2020-10-29 MED FILL — METFORMIN HCL 500 MG TABS: 500 | 30 days supply | Qty: 120 | Fill #2

## 2020-10-29 MED FILL — LISINOPRIL 10 MG TABS: 10 | 30 days supply | Qty: 30 | Fill #2

## 2020-10-29 NOTE — Telephone Encounter (Signed)
Called patient with assistance of pacific interpreter Kandis Mannan [phone]) . She verified date of birth. She is aware that CBC shows that hemoglobin is decreasing meaning her anemia is worsening. Advised patient to take iron pills as prescribed. She stated she only received 15 pills from pharmacy. Advised to contact or present to pharmacy as ordered Rx was to dispense 180. She stated she was headed to pharmacy today before closing. Scheduled her for recheck of cbc on 11/18/20. Messaged PCP to place future order. Patient  verbalized understanding of results. Maryjean Morn, CMA    Copied from CRM [id number]. Topic: General - Other >> Oct 29, 2020  3:42 PM Lyn Hollingshead D wrote: Pt calling to f /up on her lab results from 10/21/20 no one has contact her / please advise

## 2020-10-31 ENCOUNTER — Other Ambulatory Visit: Payer: Self-pay | Admitting: Nurse Practitioner

## 2020-10-31 DIAGNOSIS — D649 Anemia, unspecified: Secondary | ICD-10-CM

## 2020-10-31 MED ORDER — SENNOSIDES-DOCUSATE SODIUM 8.6-50 MG PO TABS: 2.0000 | ORAL_TABLET | Freq: Every day | ORAL | 3 refills | Status: DC

## 2020-11-02 ENCOUNTER — Ambulatory Visit (HOSPITAL_COMMUNITY)
Admission: EM | Admit: 2020-11-02 | Discharge: 2020-11-02 | Disposition: A | Discharge status: Home / Self Care | Payer: Self-pay | Attending: Family Medicine | Admitting: Family Medicine

## 2020-11-02 ENCOUNTER — Encounter (HOSPITAL_COMMUNITY): Payer: Self-pay | Admitting: *Deleted

## 2020-11-02 ENCOUNTER — Emergency Department (HOSPITAL_COMMUNITY)
Admission: EM | Admit: 2020-11-02 | Discharge: 2020-11-03 | Disposition: A | Discharge status: Home / Self Care | Payer: No Typology Code available for payment source | Attending: Emergency Medicine | Admitting: Emergency Medicine

## 2020-11-02 ENCOUNTER — Other Ambulatory Visit: Payer: Self-pay

## 2020-11-02 ENCOUNTER — Encounter (HOSPITAL_COMMUNITY): Payer: Self-pay | Admitting: Emergency Medicine

## 2020-11-02 ENCOUNTER — Other Ambulatory Visit: Payer: Self-pay | Admitting: Urgent Care

## 2020-11-02 DIAGNOSIS — Z7984 Long term (current) use of oral hypoglycemic drugs: Secondary | ICD-10-CM | POA: Insufficient documentation

## 2020-11-02 DIAGNOSIS — J1282 Pneumonia due to coronavirus disease 2019: Secondary | ICD-10-CM | POA: Insufficient documentation

## 2020-11-02 DIAGNOSIS — D649 Anemia, unspecified: Secondary | ICD-10-CM

## 2020-11-02 DIAGNOSIS — U071 COVID-19: Secondary | ICD-10-CM | POA: Insufficient documentation

## 2020-11-02 DIAGNOSIS — Z79899 Other long term (current) drug therapy: Secondary | ICD-10-CM | POA: Insufficient documentation

## 2020-11-02 DIAGNOSIS — I10 Essential (primary) hypertension: Secondary | ICD-10-CM | POA: Insufficient documentation

## 2020-11-02 DIAGNOSIS — E119 Type 2 diabetes mellitus without complications: Secondary | ICD-10-CM | POA: Insufficient documentation

## 2020-11-02 DIAGNOSIS — D500 Iron deficiency anemia secondary to blood loss (chronic): Secondary | ICD-10-CM | POA: Insufficient documentation

## 2020-11-02 DIAGNOSIS — R42 Dizziness and giddiness: Secondary | ICD-10-CM | POA: Insufficient documentation

## 2020-11-02 LAB — SARS CORONAVIRUS 2 (TAT 6-24 HRS): SARS Coronavirus 2: POSITIVE — AB

## 2020-11-02 MED ORDER — FERROUS SULFATE 325 (65 FE) MG PO TBEC: 325.0000 mg | DELAYED_RELEASE_TABLET | Freq: Two times a day (BID) | ORAL | 0 refills | Status: DC

## 2020-11-02 MED FILL — FERROUS SULFATE 325 MG TAB: 325 (65 FE) | 30 days supply | Qty: 60 | Fill #0

## 2020-11-02 NOTE — ED Provider Notes (Signed)
[place]   MRN: [id number] DOB: [birth year]  Subjective:   Rebecca Sharp is a 50 y.o. female presenting for 3 day history of intermittent dizziness, feeling cold occasionally. She's worried about an MI. Denies chest pain, shob, abdominal pain, diaphoresis, heart racing, palpitations. Has a history of well controlled diabetes. She did take hydroxyzine because she thought it was anxiety as well. No COVID vaccination.   No current facility-administered medications for this encounter.  Current Outpatient Medications:  .  atorvastatin (LIPITOR) 20 MG tablet, Take 1 tablet (20 mg total) by mouth daily. (Patient not taking: Reported on 09/27/2020), Disp: 90 tablet, Rfl: 1 .  betamethasone, augmented, (DIPROLENE) 0.05 % lotion, Apply topically 2 (two) times daily., Disp: 100 mL, Rfl: 1 .  Blood Glucose Monitoring Suppl (TRUE METRIX METER) w/Device KIT, 1 each by Does not apply route 2 (two) times daily., Disp: 1 kit, Rfl: 0 .  ferrous sulfate 325 (65 FE) MG EC tablet, Take 1 tablet (325 mg total) by mouth every other day., Disp: 180 tablet, Rfl: 1 .  glucose blood (TRUE METRIX BLOOD GLUCOSE TEST) test strip, Use as instructed, Disp: 100 each, Rfl: 12 .  lisinopril (ZESTRIL) 10 MG tablet, Take 1 tablet (10 mg total) by mouth daily., Disp: 90 tablet, Rfl: 3 .  metFORMIN (GLUCOPHAGE) 500 MG tablet, Take 2 tablets (1,000 mg total) by mouth 2 (two) times daily with a meal. (Patient taking differently: Take 1,000 mg by mouth 2 (two) times daily with a meal. Pt. Is taking one tablet 2x a day.), Disp: 360 tablet, Rfl: 0 .  naproxen (NAPROSYN) 500 MG tablet, Take 1 tablet (500 mg total) by mouth 2 (two) times daily with a meal., Disp: 30 tablet, Rfl: 0 .  senna-docusate (SENOKOT-S) 8.6-50 MG tablet, Take 2 tablets by mouth at bedtime., Disp: 60 tablet, Rfl: 3 .  sertraline (ZOLOFT) 50 MG tablet, Take 1 tablet (50 mg total) by mouth daily. (Patient not taking: Reported on  09/27/2020), Disp: 90 tablet, Rfl: 0 .  tiZANidine (ZANAFLEX) 4 MG tablet, Take 1 tablet (4 mg total) by mouth every 8 (eight) hours as needed., Disp: 30 tablet, Rfl: 0 .  TRUEplus Lancets 28G MISC, USE AS DIRECTED 2 (TWO) TIMES DAILY., Disp: 100 each, Rfl: 5 .  Zinc 100 MG TABS, Take by mouth., Disp: , Rfl:    No Known Allergies  Past Medical History:  Diagnosis Date  . AMA (advanced maternal age) multigravida 29+   . Anemia   . Diabetes mellitus without complication ([place])   . Ganglion cyst 01/25/2017   Left ankle  . Kidney stones 2013   With pregnancy   . Language barrier   . Obese   . Obesity 01/25/2017  . Pregnancy induced hypertension    at end of last 2 pregnancies     Past Surgical History:  Procedure Laterality Date  . NO PAST SURGERIES      Family History  Problem Relation Age of Onset  . Diabetes Father   . Hypertension Father     Social History   Tobacco Use  . Smoking status: Never Smoker  . Smokeless tobacco: Never Used  Vaping Use  . Vaping Use: Never used  Substance Use Topics  . Alcohol use: No    Alcohol/week: 0.0 standard drinks  . Drug use: No    ROS   Objective:   Vitals: BP 127/72 (BP Location: Right Arm)   Pulse 90   Temp 98.6 F (  37 C) (Oral)   Resp 16   LMP 10/17/2020   SpO2 100%   Physical Exam Constitutional:      General: She is not in acute distress.    Appearance: Normal appearance. She is well-developed. She is not ill-appearing, toxic-appearing or diaphoretic.  HENT:     Head: Normocephalic and atraumatic.     Right Ear: External ear normal.     Left Ear: External ear normal.     Nose: Nose normal.     Mouth/Throat:     Mouth: Mucous membranes are moist.     Pharynx: Oropharynx is clear. No oropharyngeal exudate or posterior oropharyngeal erythema.  Eyes:     General: No scleral icterus.       Right eye: No discharge.        Left eye: No discharge.     Extraocular Movements: Extraocular movements intact.      Conjunctiva/sclera: Conjunctivae normal.     Pupils: Pupils are equal, round, and reactive to light.  Cardiovascular:     Rate and Rhythm: Normal rate and regular rhythm.     Pulses: Normal pulses.     Heart sounds: Normal heart sounds. No murmur heard. No friction rub. No gallop.   Pulmonary:     Effort: Pulmonary effort is normal. No respiratory distress.     Breath sounds: Normal breath sounds. No stridor. No wheezing, rhonchi or rales.  Skin:    General: Skin is warm and dry.     Coloration: Skin is not pale.     Findings: No bruising or rash.  Neurological:     Mental Status: She is alert and oriented to person, place, and time.     Cranial Nerves: No cranial nerve deficit.     Motor: No weakness.     Coordination: Coordination normal.     Gait: Gait normal.     Deep Tendon Reflexes: Reflexes normal.  Psychiatric:        Mood and Affect: Mood normal.        Behavior: Behavior normal.        Thought Content: Thought content normal.        Judgment: Judgment normal.     ED ECG REPORT   Date: 11/02/2020  Rate: 84bpm  Rhythm: normal sinus rhythm  QRS Axis: normal  Intervals: normal  ST/T Wave abnormalities: nonspecific T wave changes  Conduction Disutrbances:none  Narrative Interpretation: Nonspecific T wave flattening in lead III which is different from previous EKG on 05/05/2020, sinus rhythm at 84 bpm. Otherwise very comparable EKG.  Old EKG Reviewed: changes noted  I have personally reviewed the EKG tracing and agree with the computerized printout as noted.   Assessment and Plan :   PDMP not reviewed this encounter.  1. Chronic anemia   2. Dizziness   3. Iron deficiency anemia due to chronic blood loss     COVID 19 testing pending. Patient has excellent physical exam findings including normal cardiopulmonary exam, normal neurologic exam. Suspect dizziness is related to her chronic anemia. She is not taking her iron supplement now because she ran out. She is  due for repeat labs in a month. Emphasized need for restarting her iron supplement and will increase her to twice daily. Vital signs stable and physical exam findings reassuring for outpatient management. Follow-up with PCP ASAP. Counseled patient on potential for adverse effects with medications prescribed/recommended today, ER and return-to-clinic precautions discussed, patient verbalized understanding.    Jaynee Eagles, PA-C 11/02/20 1349

## 2020-11-02 NOTE — ED Triage Notes (Signed)
Pt states via spanish interpreter that she is having dizziness for 2 days. She says she "feels foggy", and feels tired. No cough, no sob, no fevers. COVID + today at [facility]. 1 episode diarrhea, no vomiting.

## 2020-11-02 NOTE — ED Triage Notes (Signed)
Pt c/o dizziness onset 3 days and believes it may be her anxiety.... sts she took Hydroxyzine 25 mg for sx    Denies chest pain  A&O x4... NAD.Marland Kitchen. ambulatory.

## 2020-11-03 ENCOUNTER — Telehealth: Payer: Self-pay

## 2020-11-03 LAB — CBC
HCT: 35.3 % — ABNORMAL LOW (ref 36.0–46.0)
Hemoglobin: 11 g/dL — ABNORMAL LOW (ref 12.0–15.0)
MCH: 27.5 pg (ref 26.0–34.0)
MCHC: 31.2 g/dL (ref 30.0–36.0)
MCV: 88.3 fL (ref 80.0–100.0)
Platelets: 230 10*3/uL (ref 150–400)
RBC: 4 MIL/uL (ref 3.87–5.11)
RDW: 13.8 % (ref 11.5–15.5)
WBC: 10.4 10*3/uL (ref 4.0–10.5)
nRBC: 0 % (ref 0.0–0.2)

## 2020-11-03 LAB — BASIC METABOLIC PANEL
Anion gap: 12 (ref 5–15)
BUN: 14 mg/dL (ref 6–20)
CO2: 18 mmol/L — ABNORMAL LOW (ref 22–32)
Calcium: 8.8 mg/dL — ABNORMAL LOW (ref 8.9–10.3)
Chloride: 106 mmol/L (ref 98–111)
Creatinine, Ser: 0.7 mg/dL (ref 0.44–1.00)
GFR, Estimated: >60 mL/min (ref >60)
Glucose, Bld: 111 mg/dL — ABNORMAL HIGH (ref 70–99)
Potassium: 4.1 mmol/L (ref 3.5–5.1)
Sodium: 136 mmol/L (ref 135–145)

## 2020-11-03 NOTE — Discharge Instructions (Signed)
Stay well hydrated. Return for worsening shortness of breath or new concerns. Tylenol every 4 hrs and motrin every 6 hrs for body aches, headache or fevers.

## 2020-11-03 NOTE — ED Provider Notes (Signed)
[facility] EMERGENCY DEPARTMENT Provider Note   CSN: [id number] Arrival date & time: 11/02/20  2327     History Chief Complaint  Patient presents with  . Dizziness    Rebecca Sharp is a 50 y.o. female.  Patient with history of diabetes, anemia, obesity presents with lightheadedness and feeling tired.  Symptoms intermittent for the last 2 days.  Patient was diagnosed with COVID yesterday at urgent care.  Patient had one episode of nonbloody diarrhea.  No vomiting.  No shortness of breath or significant cough.        Past Medical History:  Diagnosis Date  . AMA (advanced maternal age) multigravida 57+   . Anemia   . Diabetes mellitus without complication ([place])   . Ganglion cyst 01/25/2017   Left ankle  . Kidney stones 2013   With pregnancy   . Language barrier   . Obese   . Obesity 01/25/2017  . Pregnancy induced hypertension    at end of last 2 pregnancies    Patient Active Problem List   Diagnosis Date Noted  . Obesity 01/25/2017  . Ganglion cyst 01/25/2017  . Prediabetes 02/17/2016  . Essential hypertension 08/19/2015  . Kidney stones     Past Surgical History:  Procedure Laterality Date  . NO PAST SURGERIES       OB History    Gravida  3   Para  3   Term  3   Preterm  0   AB  0   Living  3     SAB  0   IAB  0   Ectopic  0   Multiple  0   Live Births  3           Family History  Problem Relation Age of Onset  . Diabetes Father   . Hypertension Father     Social History   Tobacco Use  . Smoking status: Never Smoker  . Smokeless tobacco: Never Used  Vaping Use  . Vaping Use: Never used  Substance Use Topics  . Alcohol use: No    Alcohol/week: 0.0 standard drinks  . Drug use: No    Home Medications Prior to Admission medications   Medication Sig Start Date End Date Taking? Authorizing Provider  atorvastatin (LIPITOR) 20 MG tablet Take 1 tablet (20 mg total) by mouth daily. Patient not  taking: Reported on 09/27/2020 05/22/20 08/20/20  Gildardo Pounds, NP  betamethasone, augmented, (DIPROLENE) 0.05 % lotion Apply topically 2 (two) times daily. 05/22/20   Gildardo Pounds, NP  Blood Glucose Monitoring Suppl (TRUE METRIX METER) w/Device KIT 1 each by Does not apply route 2 (two) times daily. 08/21/19   Argentina Donovan, PA-C  ferrous sulfate 325 (65 FE) MG EC tablet Take 1 tablet (325 mg total) by mouth 2 (two) times daily. 11/02/20 01/31/21  Jaynee Eagles, PA-C  glucose blood (TRUE METRIX BLOOD GLUCOSE TEST) test strip Use as instructed 02/20/20   Gildardo Pounds, NP  lisinopril (ZESTRIL) 10 MG tablet Take 1 tablet (10 mg total) by mouth daily. 08/24/20   Gildardo Pounds, NP  metFORMIN (GLUCOPHAGE) 500 MG tablet Take 2 tablets (1,000 mg total) by mouth 2 (two) times daily with a meal. Patient taking differently: Take 1,000 mg by mouth 2 (two) times daily with a meal. Pt. Is taking one tablet 2x a day. 05/21/20 08/19/20  Gildardo Pounds, NP  naproxen (NAPROSYN) 500 MG tablet Take 1 tablet (500 mg total)  by mouth 2 (two) times daily with a meal. 08/26/20   Jaynee Eagles, PA-C  senna-docusate (SENOKOT-S) 8.6-50 MG tablet Take 2 tablets by mouth at bedtime. 10/31/20   Gildardo Pounds, NP  sertraline (ZOLOFT) 50 MG tablet Take 1 tablet (50 mg total) by mouth daily. Patient not taking: Reported on 09/27/2020 05/05/20   Jaynee Eagles, PA-C  tiZANidine (ZANAFLEX) 4 MG tablet Take 1 tablet (4 mg total) by mouth every 8 (eight) hours as needed. 08/26/20   Jaynee Eagles, PA-C  TRUEplus Lancets 28G MISC USE AS DIRECTED 2 (TWO) TIMES DAILY. 10/29/20   Gildardo Pounds, NP  Zinc 100 MG TABS Take by mouth.    [provider]    Allergies    Patient has no known allergies.  Review of Systems   Review of Systems  Constitutional: Negative for chills and fever.  HENT: Negative for congestion.   Eyes: Negative for visual disturbance.  Respiratory: Negative for shortness of breath.   Cardiovascular:  Negative for chest pain.  Gastrointestinal: Negative for abdominal pain and vomiting.  Genitourinary: Negative for dysuria and flank pain.  Musculoskeletal: Positive for arthralgias. Negative for back pain, neck pain and neck stiffness.  Skin: Negative for rash.  Neurological: Positive for light-headedness. Negative for headaches.    Physical Exam Updated Vital Signs BP 120/80   Pulse 71   Temp 98.5 F (36.9 C) (Oral)   Resp 16   LMP 10/17/2020   SpO2 100%   Physical Exam Vitals and nursing note reviewed.  Constitutional:      Appearance: She is well-developed and well-nourished.  HENT:     Head: Normocephalic and atraumatic.  Eyes:     General:        Right eye: No discharge.        Left eye: No discharge.     Conjunctiva/sclera: Conjunctivae normal.  Neck:     Trachea: No tracheal deviation.  Cardiovascular:     Rate and Rhythm: Normal rate and regular rhythm.  Pulmonary:     Effort: Pulmonary effort is normal.     Breath sounds: Normal breath sounds.  Abdominal:     General: There is no distension.     Palpations: Abdomen is soft.     Tenderness: There is no abdominal tenderness. There is no guarding.  Musculoskeletal:        General: No edema.     Cervical back: Normal range of motion and neck supple.  Skin:    General: Skin is warm.     Findings: No rash.  Neurological:     General: No focal deficit present.     Mental Status: She is alert and oriented to person, place, and time.     Cranial Nerves: No cranial nerve deficit.     Motor: No weakness.  Psychiatric:        Mood and Affect: Mood and affect and mood normal.     ED Results / Procedures / Treatments   Labs (all labs ordered are listed, but only abnormal results are displayed) Labs Reviewed  BASIC METABOLIC PANEL - Abnormal; Notable for the following components:      Result Value   CO2 18 (*)    Glucose, Bld 111 (*)    Calcium 8.8 (*)    All other components within normal limits  CBC -  Abnormal; Notable for the following components:   Hemoglobin 11.0 (*)    HCT 35.3 (*)    All other components within  normal limits    EKG None  Radiology No results found.  Procedures Procedures (including critical care time)  Medications Ordered in ED Medications - No data to display  ED Course  I have reviewed the triage vital signs and the nursing notes.  Pertinent labs & imaging results that were available during my care of the patient were reviewed by me and considered in my medical decision making (see chart for details).    MDM Rules/Calculators/A&P                          Patient with recent COVID diagnosis presents with lightheadedness body aches and feeling foggy.  Interpreter used for translation.  Clinical concern for symptoms secondary to COVID with her having sweats and lightheaded sensation which is common.  Neuro doing well. Discussed this with the patient and she understands.  Other differentials considered including anemia, electrolyte abnormalities.  Blood work ordered and reviewed showing bicarb 18, patient tolerating oral liquids, mild anemia 11 hemoglobin.  Discussed isolation and close outpatient follow-up.  Joya Gaskins Tama Sharp was evaluated in Emergency Department on 11/03/2020 for the symptoms described in the history of present illness. She was evaluated in the context of the global COVID-19 pandemic, which necessitated consideration that the patient might be at risk for infection with the SARS-CoV-2 virus that causes COVID-19. Institutional protocols and algorithms that pertain to the evaluation of patients at risk for COVID-19 are in a state of rapid change based on information released by regulatory bodies including the CDC and federal and state organizations. These policies and algorithms were followed during the patient's care in the ED.  Final Clinical Impression(s) / ED Diagnoses Final diagnoses:  Pneumonia due to COVID-19 virus  Lightheadedness     Rx / DC Orders ED Discharge Orders    None       Elnora Morrison, MD 11/03/20 1031

## 2020-11-03 NOTE — Telephone Encounter (Signed)
Called to discuss with patient about COVID-19 symptoms and the use of one of the available treatments for those with mild to moderate Covid symptoms and at a high risk of hospitalization.  Pt appears to qualify for outpatient treatment due to co-morbid conditions and/or a member of an at-risk group in accordance with the FDA Emergency Use Authorization.    Symptom onset: 10/31/20 per chart Vaccinated: Unknown Booster? Unknown Immunocompromised? No Qualifiers: HTN  Unable to reach pt - Left message with call back number [phone].  Rebecca Sharp

## 2020-11-08 ENCOUNTER — Ambulatory Visit (HOSPITAL_COMMUNITY)
Admission: EM | Admit: 2020-11-08 | Discharge: 2020-11-08 | Disposition: A | Discharge status: Home / Self Care | Payer: No Typology Code available for payment source | Attending: Student | Admitting: Student

## 2020-11-08 ENCOUNTER — Encounter (HOSPITAL_COMMUNITY): Payer: Self-pay

## 2020-11-08 DIAGNOSIS — U071 COVID-19: Secondary | ICD-10-CM

## 2020-11-08 DIAGNOSIS — R197 Diarrhea, unspecified: Secondary | ICD-10-CM

## 2020-11-08 DIAGNOSIS — F411 Generalized anxiety disorder: Secondary | ICD-10-CM

## 2020-11-08 MED ORDER — LOPERAMIDE HCL 2 MG PO CAPS: 2.0000 mg | ORAL_CAPSULE | Freq: Four times a day (QID) | ORAL | 0 refills | Status: DC | PRN

## 2020-11-08 NOTE — Discharge Instructions (Signed)
-  Immodium for diarrhea, up to 4x daily -Continue to drink plenty of fluids like water and pedialyte. Bland diet as tolerated. -Seek additional medical attention if chest pain, shortness of breath, new/worsening fevers/chills, etc.

## 2020-11-08 NOTE — ED Provider Notes (Signed)
[facility]    CSN: [id number] Arrival date & time: 11/08/20  1411      History   Chief Complaint Chief Complaint  Patient presents with  . Chills  . Diarrhea    HPI Rebecca Sharp is a 50 y.o. female Presenting for continued covid symptoms for 5 days, following covid diagnosis. States she initially was experiencing cough, chills but these have improved. Today she notes diarrhea 5x daily, but no n/v/abd pain. She was diagnosed with covid pneumonia in ER 6 days ago but denies shortness of breath, fevers, chills, cough, etc; not taking any medications for this. Denies fevers/chills, n/v/d, shortness of breath, chest pain, cough, congestion, facial pain, teeth pain, headaches, sore throat, loss of taste/smell, swollen lymph nodes, ear pain.  Denies chest pain, shortness of breath, confusion, high fevers.     HPI  Past Medical History:  Diagnosis Date  . AMA (advanced maternal age) multigravida 22+   . Anemia   . Diabetes mellitus without complication ([place])   . Ganglion cyst 01/25/2017   Left ankle  . Kidney stones 2013   With pregnancy   . Language barrier   . Obese   . Obesity 01/25/2017  . Pregnancy induced hypertension    at end of last 2 pregnancies    Patient Active Problem List   Diagnosis Date Noted  . Obesity 01/25/2017  . Ganglion cyst 01/25/2017  . Prediabetes 02/17/2016  . Essential hypertension 08/19/2015  . Kidney stones     Past Surgical History:  Procedure Laterality Date  . NO PAST SURGERIES      OB History    Gravida  3   Para  3   Term  3   Preterm  0   AB  0   Living  3     SAB  0   IAB  0   Ectopic  0   Multiple  0   Live Births  3            Home Medications    Prior to Admission medications   Medication Sig Start Date End Date Taking? Authorizing Provider  loperamide (IMODIUM) 2 MG capsule Take 1 capsule (2 mg total) by mouth 4 (four) times daily as needed for diarrhea or loose stools. 11/08/20   Yes Hazel Sams, PA-C  betamethasone, augmented, (DIPROLENE) 0.05 % lotion Apply topically 2 (two) times daily. 05/22/20   Gildardo Pounds, NP  Blood Glucose Monitoring Suppl (TRUE METRIX METER) w/Device KIT 1 each by Does not apply route 2 (two) times daily. 08/21/19   Argentina Donovan, PA-C  ferrous sulfate 325 (65 FE) MG EC tablet Take 1 tablet (325 mg total) by mouth 2 (two) times daily. 11/02/20 01/31/21  Jaynee Eagles, PA-C  glucose blood (TRUE METRIX BLOOD GLUCOSE TEST) test strip Use as instructed 02/20/20   Gildardo Pounds, NP  lisinopril (ZESTRIL) 10 MG tablet Take 1 tablet (10 mg total) by mouth daily. 08/24/20   Gildardo Pounds, NP  metFORMIN (GLUCOPHAGE) 500 MG tablet Take 2 tablets (1,000 mg total) by mouth 2 (two) times daily with a meal. Patient taking differently: Take 1,000 mg by mouth 2 (two) times daily with a meal. Pt. Is taking one tablet 2x a day. 05/21/20 08/19/20  Gildardo Pounds, NP  naproxen (NAPROSYN) 500 MG tablet Take 1 tablet (500 mg total) by mouth 2 (two) times daily with a meal. 08/26/20   Jaynee Eagles, PA-C  senna-docusate (SENOKOT-S) 8.6-50  MG tablet Take 2 tablets by mouth at bedtime. 10/31/20   Gildardo Pounds, NP  sertraline (ZOLOFT) 50 MG tablet Take 1 tablet (50 mg total) by mouth daily. Patient not taking: Reported on 09/27/2020 05/05/20   Jaynee Eagles, PA-C  tiZANidine (ZANAFLEX) 4 MG tablet Take 1 tablet (4 mg total) by mouth every 8 (eight) hours as needed. 08/26/20   Jaynee Eagles, PA-C  TRUEplus Lancets 28G MISC USE AS DIRECTED 2 (TWO) TIMES DAILY. 10/29/20   Gildardo Pounds, NP  Zinc 100 MG TABS Take by mouth.    [provider]  atorvastatin (LIPITOR) 20 MG tablet Take 1 tablet (20 mg total) by mouth daily. Patient not taking: Reported on 09/27/2020 05/22/20 11/08/20  Gildardo Pounds, NP    Family History Family History  Problem Relation Age of Onset  . Diabetes Father   . Hypertension Father     Social History Social History   Tobacco Use   . Smoking status: Never Smoker  . Smokeless tobacco: Never Used  Vaping Use  . Vaping Use: Never used  Substance Use Topics  . Alcohol use: No    Alcohol/week: 0.0 standard drinks  . Drug use: No     Allergies   Patient has no known allergies.   Review of Systems Review of Systems  Constitutional: Negative for appetite change, chills and fever.  HENT: Negative for congestion, ear pain, rhinorrhea, sinus pressure, sinus pain and sore throat.   Eyes: Negative for redness and visual disturbance.  Respiratory: Negative for cough, chest tightness, shortness of breath and wheezing.   Cardiovascular: Negative for chest pain and palpitations.  Gastrointestinal: Positive for diarrhea. Negative for abdominal pain, constipation, nausea and vomiting.  Genitourinary: Negative for dysuria, frequency and urgency.  Musculoskeletal: Negative for myalgias.  Neurological: Negative for dizziness, weakness and headaches.  Psychiatric/Behavioral: Negative for confusion.  All other systems reviewed and are negative.    Physical Exam Triage Vital Signs ED Triage Vitals  Enc Vitals Group     BP 11/08/20 1511 122/71     Pulse Rate 11/08/20 1511 90     Resp 11/08/20 1511 16     Temp 11/08/20 1511 98.5 F (36.9 C)     Temp Source 11/08/20 1511 Oral     SpO2 11/08/20 1511 98 %     Weight --      Height --      Head Circumference --      Peak Flow --      Pain Score 11/08/20 1509 0     Pain Loc --      Pain Edu? --      Excl. in [place]? --    No data found.  Updated Vital Signs BP 122/71 (BP Location: Left Arm)   Pulse 90   Temp 98.5 F (36.9 C) (Oral)   Resp 16   LMP 10/17/2020   SpO2 98%   Visual Acuity Right Eye Distance:   Left Eye Distance:   Bilateral Distance:    Right Eye Near:   Left Eye Near:    Bilateral Near:     Physical Exam Vitals reviewed.  Constitutional:      General: She is not in acute distress.    Appearance: Normal appearance. She is not ill-appearing.   HENT:     Head: Normocephalic and atraumatic.     Right Ear: Hearing, tympanic membrane, ear canal and external ear normal. No swelling or tenderness. There is no impacted cerumen. No  mastoid tenderness. Tympanic membrane is not perforated, erythematous, retracted or bulging.     Left Ear: Hearing, tympanic membrane, ear canal and external ear normal. No swelling or tenderness. There is no impacted cerumen. No mastoid tenderness. Tympanic membrane is not perforated, erythematous, retracted or bulging.     Nose:     Right Sinus: No maxillary sinus tenderness or frontal sinus tenderness.     Left Sinus: No maxillary sinus tenderness or frontal sinus tenderness.     Mouth/Throat:     Mouth: Mucous membranes are moist.     Pharynx: Uvula midline. No oropharyngeal exudate or posterior oropharyngeal erythema.     Tonsils: No tonsillar exudate.  Cardiovascular:     Rate and Rhythm: Normal rate and regular rhythm.     Heart sounds: Normal heart sounds.  Pulmonary:     Breath sounds: Normal breath sounds and air entry. No wheezing, rhonchi or rales.  Chest:     Chest wall: No tenderness.  Abdominal:     General: Abdomen is flat. Bowel sounds are normal. There is no distension.     Tenderness: There is no abdominal tenderness. There is no right CVA tenderness, left CVA tenderness, guarding or rebound. Negative signs include Murphy's sign, Rovsing's sign and McBurney's sign.  Lymphadenopathy:     Cervical: No cervical adenopathy.  Neurological:     General: No focal deficit present.     Mental Status: She is alert and oriented to person, place, and time.  Psychiatric:        Attention and Perception: Attention and perception normal.        Mood and Affect: Mood and affect normal.        Behavior: Behavior normal. Behavior is cooperative.        Thought Content: Thought content normal.        Judgment: Judgment normal.      UC Treatments / Results  Labs (all labs ordered are listed, but  only abnormal results are displayed) Labs Reviewed - No data to display  EKG   Radiology No results found.  Procedures Procedures (including critical care time)  Medications Ordered in UC Medications - No data to display  Initial Impression / Assessment and Plan / UC Course  I have reviewed the triage vital signs and the nursing notes.  Pertinent labs & imaging results that were available during my care of the patient were reviewed by me and considered in my medical decision making (see chart for details).     Pt is currently diagnosed with covid19. She was seen in the ED on 11/02/2020 (6 days ago) and diagnosed with covid pneumonia, however no chest xray on file and she is not taking any medications for this.  afebrile nontachycardic nontachypneic, oxygenating well on room air. No wheezes rhonchi or rales. Today she denies cough, shortness of breath, fevers/chills, chest pain.   She endorses 5 episodes of diarrhea daily; imodium up to 4x daily as below. rec good hydration, BRAT diet. Return precautions- inability to tolerate hydration by mouth, chest pain, shortness of breath, new/worsening fevers/chills, etc.   For anxiety, continue sertraline as directed by PCP.  Final Clinical Impressions(s) / UC Diagnoses   Final diagnoses:  COVID-19  Diarrhea of presumed infectious origin  Generalized anxiety disorder     Discharge Instructions     -Immodium for diarrhea, up to 4x daily -Continue to drink plenty of fluids like water and pedialyte. Bland diet as tolerated. -Seek additional medical attention if chest  pain, shortness of breath, new/worsening fevers/chills, etc.    ED Prescriptions    Medication Sig Dispense Auth. Provider   loperamide (IMODIUM) 2 MG capsule Take 1 capsule (2 mg total) by mouth 4 (four) times daily as needed for diarrhea or loose stools. 12 capsule Hazel Sams, PA-C     PDMP not reviewed this encounter.   Hazel Sams, PA-C 11/08/20  1620

## 2020-11-08 NOTE — ED Triage Notes (Signed)
Pt c/o chills and diarrhea x 2 days. Pt states she has been feeling very thirsty the past couple days. She denies nausea and vomiting.

## 2020-11-18 ENCOUNTER — Other Ambulatory Visit: Payer: No Typology Code available for payment source

## 2020-12-02 MED FILL — TRUE METRIX TEST STRIP: 25 days supply | Qty: 100 | Fill #1

## 2020-12-02 MED FILL — LISINOPRIL 10 MG TABS: 10 | 30 days supply | Qty: 30 | Fill #3

## 2020-12-16 ENCOUNTER — Ambulatory Visit: Payer: Self-pay | Attending: Nurse Practitioner

## 2020-12-16 ENCOUNTER — Other Ambulatory Visit: Payer: Self-pay

## 2020-12-18 ENCOUNTER — Encounter (HOSPITAL_COMMUNITY): Payer: Self-pay | Admitting: Emergency Medicine

## 2020-12-18 ENCOUNTER — Ambulatory Visit (HOSPITAL_COMMUNITY)
Admission: EM | Admit: 2020-12-18 | Discharge: 2020-12-18 | Disposition: A | Discharge status: Home / Self Care | Payer: No Typology Code available for payment source

## 2020-12-18 ENCOUNTER — Other Ambulatory Visit: Payer: Self-pay

## 2020-12-18 DIAGNOSIS — R21 Rash and other nonspecific skin eruption: Secondary | ICD-10-CM

## 2020-12-18 DIAGNOSIS — R6889 Other general symptoms and signs: Secondary | ICD-10-CM

## 2020-12-18 NOTE — ED Provider Notes (Signed)
[facility]   [id number] 12/18/20 Arrival Time: 7253  CC: RASH  SUBJECTIVE:  Rebecca Sharp is a 50 y.o. female who presents with a skin complaint that began yesterday.  Denies any itching or pain to rash.  Denies precipitating event or trauma.  Denies changes in soaps, detergents, close contacts with similar rash, known trigger or environmental trigger, allergy. Denies medications change or starting a new medication recently.  Localizes the rash to under right breast.  There are no aggravating or alleviating factors. Denies similar symptoms in the past.  Denies fever, chills, nausea, vomiting, erythema, swelling, discharge, oral lesions, SOB, chest pain, abdominal pain, changes in bowel or bladder function.   Also reports intermittent right eye itching and watering.  Denies any vision changes.     ROS: As per HPI.  All other pertinent ROS negative.     Past Medical History:  Diagnosis Date  . AMA (advanced maternal age) multigravida 35+   . Anemia   . Diabetes mellitus without complication ([place])   . Ganglion cyst 01/25/2017   Left ankle  . Kidney stones 2013   With pregnancy   . Language barrier   . Obese   . Obesity 01/25/2017  . Pregnancy induced hypertension    at end of last 2 pregnancies   Past Surgical History:  Procedure Laterality Date  . NO PAST SURGERIES     No Known Allergies No current facility-administered medications on file prior to encounter.   Current Outpatient Medications on File Prior to Encounter  Medication Sig Dispense Refill  . betamethasone, augmented, (DIPROLENE) 0.05 % lotion Apply topically 2 (two) times daily. 100 mL 1  . Blood Glucose Monitoring Suppl (TRUE METRIX METER) w/Device KIT 1 each by Does not apply route 2 (two) times daily. 1 kit 0  . ferrous sulfate 325 (65 FE) MG EC tablet Take 1 tablet (325 mg total) by mouth 2 (two) times daily. 180 tablet 0  . glucose blood (TRUE METRIX BLOOD GLUCOSE TEST) test strip Use as  instructed 100 each 12  . lisinopril (ZESTRIL) 10 MG tablet Take 1 tablet (10 mg total) by mouth daily. 90 tablet 3  . loperamide (IMODIUM) 2 MG capsule Take 1 capsule (2 mg total) by mouth 4 (four) times daily as needed for diarrhea or loose stools. 12 capsule 0  . metFORMIN (GLUCOPHAGE) 500 MG tablet Take 2 tablets (1,000 mg total) by mouth 2 (two) times daily with a meal. (Patient taking differently: Take 1,000 mg by mouth 2 (two) times daily with a meal. Pt. Is taking one tablet 2x a day.) 360 tablet 0  . naproxen (NAPROSYN) 500 MG tablet Take 1 tablet (500 mg total) by mouth 2 (two) times daily with a meal. 30 tablet 0  . senna-docusate (SENOKOT-S) 8.6-50 MG tablet Take 2 tablets by mouth at bedtime. 60 tablet 3  . sertraline (ZOLOFT) 50 MG tablet Take 1 tablet (50 mg total) by mouth daily. (Patient not taking: Reported on 09/27/2020) 90 tablet 0  . tiZANidine (ZANAFLEX) 4 MG tablet Take 1 tablet (4 mg total) by mouth every 8 (eight) hours as needed. 30 tablet 0  . TRUEplus Lancets 28G MISC USE AS DIRECTED 2 (TWO) TIMES DAILY. 100 each 5  . Zinc 100 MG TABS Take by mouth.    . [DISCONTINUED] atorvastatin (LIPITOR) 20 MG tablet Take 1 tablet (20 mg total) by mouth daily. (Patient not taking: Reported on 09/27/2020) 90 tablet 1   Social History   Socioeconomic  History  . Marital status: Single    Spouse name: Not on file  . Number of children: 3  . Years of education: <8th grade  . Highest education level: 6th grade  Occupational History  . Occupation: Airline pilot: [employer]  Tobacco Use  . Smoking status: Never Smoker  . Smokeless tobacco: Never Used  Vaping Use  . Vaping Use: Never used  Substance and Sexual Activity  . Alcohol use: No    Alcohol/week: 0.0 standard drinks  . Drug use: No  . Sexual activity: Yes    Birth control/protection: Condom    Comment: No current boyfriend or spouse-not interested  Other Topics Concern  . Not on file  Social History Narrative  .  Not on file   Social Determinants of Health   Financial Resource Strain: Not on file  Food Insecurity: Not on file  Transportation Needs: Not on file  Physical Activity: Not on file  Stress: Not on file  Social Connections: Not on file  Intimate Partner Violence: Not on file   Family History  Problem Relation Age of Onset  . Diabetes Father   . Hypertension Father     OBJECTIVE: Vitals:   12/18/20 1634  BP: 125/65  Pulse: 62  Resp: 18  Temp: 98.7 F (37.1 C)  TempSrc: Oral  SpO2: 98%    General appearance: alert; no distress Head: NCAT Lungs: clear to auscultation bilaterally Heart: regular rate and rhythm.  Radial pulse 2+ bilaterally Extremities: no edema Skin: warm and dry; (see picture below) Psychological: alert and cooperative; normal mood and affect  ASSESSMENT & PLAN:  1. Rash and nonspecific skin eruption   2. Itchy eyes     No orders of the defined types were placed in this encounter.    Use lotion as need for rash relief.   Avoid hot showers/ baths Moisturize skin daily  Follow up with PCP if symptoms persists Return or go to the ER if you have any new or worsening symptoms such as fever, chills, nausea, vomiting, redness, swelling, discharge, if symptoms do not improve with medications  Use allergy eye drops for eye itching.   Reviewed expectations re: course of current medical issues. Questions answered. Outlined signs and symptoms indicating need for more acute intervention. Patient verbalized understanding. After Visit Summary given.   Pearson Forster, NP 12/18/20 [phone]

## 2020-12-18 NOTE — ED Triage Notes (Signed)
Pt states that she has a rash under her both breast. Pt states that she noticed the rash last night. Pt states that she is applying a cream to the rash on her hand but wants to make sure that she can apply the same cram to the rash under her breast.

## 2020-12-18 NOTE — Discharge Instructions (Addendum)
Use cream or lotion as needed for rash on chest.   Keep area clean and dry.    Use allergy eye drops to help with eye itching.    Return or go to the Emergency Department if symptoms worsen or do not improve in the next few days.

## 2020-12-27 ENCOUNTER — Other Ambulatory Visit: Payer: Self-pay

## 2020-12-27 ENCOUNTER — Ambulatory Visit: Payer: Self-pay | Admitting: Nurse Practitioner

## 2021-01-15 ENCOUNTER — Other Ambulatory Visit: Payer: Self-pay

## 2021-01-17 ENCOUNTER — Emergency Department (HOSPITAL_COMMUNITY)
Admission: EM | Admit: 2021-01-17 | Discharge: 2021-01-18 | Disposition: A | Discharge status: Home / Self Care | Payer: Self-pay | Attending: Emergency Medicine | Admitting: Emergency Medicine

## 2021-01-17 ENCOUNTER — Other Ambulatory Visit: Payer: Self-pay

## 2021-01-17 ENCOUNTER — Encounter (HOSPITAL_COMMUNITY): Payer: Self-pay | Admitting: Emergency Medicine

## 2021-01-17 DIAGNOSIS — I10 Essential (primary) hypertension: Secondary | ICD-10-CM | POA: Insufficient documentation

## 2021-01-17 DIAGNOSIS — M5410 Radiculopathy, site unspecified: Secondary | ICD-10-CM

## 2021-01-17 DIAGNOSIS — E1169 Type 2 diabetes mellitus with other specified complication: Secondary | ICD-10-CM | POA: Insufficient documentation

## 2021-01-17 DIAGNOSIS — Z7984 Long term (current) use of oral hypoglycemic drugs: Secondary | ICD-10-CM | POA: Insufficient documentation

## 2021-01-17 DIAGNOSIS — M5412 Radiculopathy, cervical region: Secondary | ICD-10-CM | POA: Insufficient documentation

## 2021-01-17 DIAGNOSIS — E669 Obesity, unspecified: Secondary | ICD-10-CM | POA: Insufficient documentation

## 2021-01-17 DIAGNOSIS — Z79899 Other long term (current) drug therapy: Secondary | ICD-10-CM | POA: Insufficient documentation

## 2021-01-17 LAB — CBC WITH DIFFERENTIAL/PLATELET
Abs Immature Granulocytes: 0.02 10*3/uL (ref 0.00–0.07)
Basophils Absolute: 0 10*3/uL (ref 0.0–0.1)
Basophils Relative: 1 %
Eosinophils Absolute: 0.2 10*3/uL (ref 0.0–0.5)
Eosinophils Relative: 3 %
HCT: 33.4 % — ABNORMAL LOW (ref 36.0–46.0)
Hemoglobin: 10.8 g/dL — ABNORMAL LOW (ref 12.0–15.0)
Immature Granulocytes: 0 %
Lymphocytes Relative: 34 %
Lymphs Abs: 2.8 10*3/uL (ref 0.7–4.0)
MCH: 28.6 pg (ref 26.0–34.0)
MCHC: 32.3 g/dL (ref 30.0–36.0)
MCV: 88.4 fL (ref 80.0–100.0)
Monocytes Absolute: 0.4 10*3/uL (ref 0.1–1.0)
Monocytes Relative: 5 %
Neutro Abs: 4.7 10*3/uL (ref 1.7–7.7)
Neutrophils Relative %: 57 %
Platelets: 243 10*3/uL (ref 150–400)
RBC: 3.78 MIL/uL — ABNORMAL LOW (ref 3.87–5.11)
RDW: 13.7 % (ref 11.5–15.5)
WBC: 8.2 10*3/uL (ref 4.0–10.5)
nRBC: 0 % (ref 0.0–0.2)

## 2021-01-17 LAB — COMPREHENSIVE METABOLIC PANEL
ALT: 14 U/L (ref 0–44)
AST: 16 U/L (ref 15–41)
Albumin: 3.6 g/dL (ref 3.5–5.0)
Alkaline Phosphatase: 46 U/L (ref 38–126)
Anion gap: 6 (ref 5–15)
BUN: 16 mg/dL (ref 6–20)
CO2: 22 mmol/L (ref 22–32)
Calcium: 8.7 mg/dL — ABNORMAL LOW (ref 8.9–10.3)
Chloride: 106 mmol/L (ref 98–111)
Creatinine, Ser: 0.86 mg/dL (ref 0.44–1.00)
GFR, Estimated: >60 mL/min (ref >60)
Glucose, Bld: 109 mg/dL — ABNORMAL HIGH (ref 70–99)
Potassium: 3.4 mmol/L — ABNORMAL LOW (ref 3.5–5.1)
Sodium: 134 mmol/L — ABNORMAL LOW (ref 135–145)
Total Bilirubin: 0.3 mg/dL (ref 0.3–1.2)
Total Protein: 7.1 g/dL (ref 6.5–8.1)

## 2021-01-17 LAB — I-STAT BETA HCG BLOOD, ED (MC, WL, AP ONLY): I-stat hCG, quantitative: <5 m[IU]/mL (ref <5)

## 2021-01-17 MED ORDER — KETOROLAC TROMETHAMINE 30 MG/ML IJ SOLN
30.0000 mg | Freq: Once | INTRAMUSCULAR | Status: AC
  Administered 2021-01-18: 30 mg via INTRAMUSCULAR
  Filled 2021-01-17: qty 1

## 2021-01-17 NOTE — ED Triage Notes (Signed)
Emergency Medicine Provider Triage Evaluation Note  Roya Gieselman , a 50 y.o. female  was evaluated in triage.  Pt complains of feeling tired.  Feels like right arm is weak.  She reports that her neck and right arm are sore.     Physical Exam  BP 124/72 (BP Location: Left Arm)   Pulse 90   Temp 98.4 F (36.9 C) (Oral)   Resp 18   SpO2 99%  Patient is awake and alert.  5/5 upper extremity strength.  Speech is not slurred.   Medical Decision Making  Medically screening exam initiated at 7:24 PM.  Appropriate orders placed.  Uvaldo Rising was informed that the remainder of the evaluation will be completed by another provider, this initial triage assessment does not replace that evaluation, and the importance of remaining in the ED until their evaluation is complete.  Clinical Impression  Right arm pain. Neck pain.    Cristina Gong, [place] 01/17/21 1925

## 2021-01-17 NOTE — ED Provider Notes (Signed)
[facility] EMERGENCY DEPARTMENT Provider Note   CSN: [id number] Arrival date & time: 01/17/21  1909     History Chief Complaint  Patient presents with  . Neck Pain  . Weakness    Rebecca Sharp is a 50 y.o. female.  HPI    History taken with interpreter  This is a 50 year old female with history of diabetes, anemia, obesity who presents with neck pain and right hand weakness.  Patient is right-handed.  She states she has had several month history of intermittent neck pain.  She describes it as achy.  It does not radiate.  It is over the posterior neck and right shoulder area.  Currently it is 6 out of 10.  she states over the last 2 days she has noted some "heaviness" in her right hand.  She has been able to maintain her ability to write and pick things up.  She has not noted any other weakness or numbness.  She states "just feels tired."  She was seen and evaluated in urgent care several months ago and provided with anti-inflammatories and muscle relaxant.  She denies any injury.  Past Medical History:  Diagnosis Date  . AMA (advanced maternal age) multigravida 30+   . Anemia   . Diabetes mellitus without complication ([place])   . Ganglion cyst 01/25/2017   Left ankle  . Kidney stones 2013   With pregnancy   . Language barrier   . Obese   . Obesity 01/25/2017  . Pregnancy induced hypertension    at end of last 2 pregnancies    Patient Active Problem List   Diagnosis Date Noted  . Obesity 01/25/2017  . Ganglion cyst 01/25/2017  . Prediabetes 02/17/2016  . Essential hypertension 08/19/2015  . Kidney stones     Past Surgical History:  Procedure Laterality Date  . NO PAST SURGERIES       OB History    Gravida  3   Para  3   Term  3   Preterm  0   AB  0   Living  3     SAB  0   IAB  0   Ectopic  0   Multiple  0   Live Births  3           Family History  Problem Relation Age of Onset  . Diabetes Father   .  Hypertension Father     Social History   Tobacco Use  . Smoking status: Never Smoker  . Smokeless tobacco: Never Used  Vaping Use  . Vaping Use: Never used  Substance Use Topics  . Alcohol use: No    Alcohol/week: 0.0 standard drinks  . Drug use: No    Home Medications Prior to Admission medications   Medication Sig Start Date End Date Taking? Authorizing Provider  methylPREDNISolone (MEDROL DOSEPAK) 4 MG TBPK tablet Take as directed on packet 01/18/21  Yes Nellie Chevalier, Barbette Hair, MD  betamethasone dipropionate 0.05 % cream APPLY TOPICALLY 2 (TWO) TIMES DAILY. 05/21/20 05/21/21  Gildardo Pounds, NP  betamethasone, augmented, (DIPROLENE) 0.05 % lotion Apply topically 2 (two) times daily. 05/22/20   Gildardo Pounds, NP  Blood Glucose Monitoring Suppl (TRUE METRIX METER) w/Device KIT 1 each by Does not apply route 2 (two) times daily. 08/21/19   Argentina Donovan, PA-C  ferrous sulfate 325 (65 FE) MG EC tablet Take 1 tablet (325 mg total) by mouth 2 (two) times daily. 11/02/20 01/31/21  Bess Harvest,  Freida Busman, PA-C  ferrous sulfate 325 (65 FE) MG tablet TAKE 1 TABLET (325 MG TOTAL) BY MOUTH 2 (TWO) TIMES DAILY. 11/02/20 11/02/21  Jaynee Eagles, PA-C  ferrous sulfate 325 (65 FE) MG tablet TAKE 1 TABLET (325 MG TOTAL) BY MOUTH EVERY OTHER DAY. 09/27/20 09/27/21  Gildardo Pounds, NP  ferrous sulfate 325 (65 FE) MG tablet TAKE 1 TABLET (325 MG TOTAL) BY MOUTH EVERY OTHER DAY. 05/21/20 05/21/21  Gildardo Pounds, NP  glucose blood (TRUE METRIX BLOOD GLUCOSE TEST) test strip Use as instructed 02/20/20   Gildardo Pounds, NP  hydrOXYzine (ATARAX/VISTARIL) 25 MG tablet TAKE 1/2-1 TABLET BY MOUTH EVERY 8 (EIGHT) HOURS AS NEEDED FOR ANXIETY. 09/27/20 09/27/21  Gildardo Pounds, NP  lisinopril (ZESTRIL) 10 MG tablet TAKE 1 TABLET (10 MG TOTAL) BY MOUTH DAILY. 08/24/20 08/24/21  Gildardo Pounds, NP  loperamide (IMODIUM) 2 MG capsule Take 1 capsule (2 mg total) by mouth 4 (four) times daily as needed for diarrhea or loose stools.  11/08/20   Hazel Sams, PA-C  metFORMIN (GLUCOPHAGE) 500 MG tablet TAKE 2 TABLETS (1,000 MG TOTAL) BY MOUTH 2 (TWO) TIMES DAILY WITH A MEAL. Patient taking differently: Take 1,000 mg by mouth 2 (two) times daily with a meal. Pt. Is taking one tablet 2x a day. 05/21/20 05/21/21  Gildardo Pounds, NP  naproxen (NAPROSYN) 500 MG tablet Take 1 tablet (500 mg total) by mouth 2 (two) times daily with a meal. 08/26/20   Jaynee Eagles, PA-C  senna-docusate (SENOKOT-S) 8.6-50 MG tablet TAKE 2 TABLETS BY MOUTH AT BEDTIME. 10/31/20 10/31/21  Gildardo Pounds, NP  sertraline (ZOLOFT) 50 MG tablet Take 1 tablet (50 mg total) by mouth daily. Patient not taking: Reported on 09/27/2020 05/05/20   Jaynee Eagles, PA-C  tiZANidine (ZANAFLEX) 4 MG tablet TAKE 1 TABLET (4 MG TOTAL) BY MOUTH EVERY 8 (EIGHT) HOURS AS NEEDED. 08/26/20 08/26/21  Jaynee Eagles, PA-C  TRUEplus Lancets 28G MISC USE AS DIRECTED 2 (TWO) TIMES DAILY. 10/29/20 10/29/21  Gildardo Pounds, NP  Zinc 100 MG TABS Take by mouth.    [provider]  atorvastatin (LIPITOR) 20 MG tablet Take 1 tablet (20 mg total) by mouth daily. Patient not taking: Reported on 09/27/2020 05/22/20 11/08/20  Gildardo Pounds, NP    Allergies    Patient has no known allergies.  Review of Systems   Review of Systems  Constitutional: Negative for fever.  Musculoskeletal: Positive for neck pain.  Neurological: Positive for weakness. Negative for numbness and headaches.  All other systems reviewed and are negative.   Physical Exam Updated Vital Signs BP 106/81   Pulse 84   Temp 98.4 F (36.9 C) (Oral)   Resp 16   Ht 1.499 m (4' 11" )   Wt 79.8 kg   SpO2 100%   BMI 35.55 kg/m   Physical Exam Vitals and nursing note reviewed.  Constitutional:      Appearance: She is well-developed. She is obese. She is not ill-appearing.  HENT:     Head: Normocephalic and atraumatic.     Mouth/Throat:     Mouth: Mucous membranes are moist.  Eyes:     Pupils: Pupils are  equal, round, and reactive to light.  Neck:     Comments: No midline C-spine tenderness palpation, step-off, deformity Cardiovascular:     Rate and Rhythm: Normal rate and regular rhythm.     Heart sounds: Normal heart sounds.  Pulmonary:     Effort: Pulmonary  effort is normal. No respiratory distress.     Breath sounds: No wheezing.  Abdominal:     Palpations: Abdomen is soft.     Tenderness: There is no abdominal tenderness.  Musculoskeletal:     Cervical back: Normal range of motion and neck supple.     Comments: Tenderness to palpation in the right brachial plexus region  Skin:    General: Skin is warm and dry.  Neurological:     Mental Status: She is alert and oriented to person, place, and time.     Comments: 5 out of 5 grip, biceps, triceps, deltoid strength bilaterally  Psychiatric:        Mood and Affect: Mood normal.     ED Results / Procedures / Treatments   Labs (all labs ordered are listed, but only abnormal results are displayed) Labs Reviewed  CBC WITH DIFFERENTIAL/PLATELET - Abnormal; Notable for the following components:      Result Value   RBC 3.78 (*)    Hemoglobin 10.8 (*)    HCT 33.4 (*)    All other components within normal limits  COMPREHENSIVE METABOLIC PANEL - Abnormal; Notable for the following components:   Sodium 134 (*)    Potassium 3.4 (*)    Glucose, Bld 109 (*)    Calcium 8.7 (*)    All other components within normal limits  I-STAT BETA HCG BLOOD, ED ([facility], [facility], [facility] ONLY)    EKG None  Radiology CT Cervical Spine Wo Contrast  Result Date: 01/18/2021 CLINICAL DATA:  Cervical radiculopathy EXAM: CT CERVICAL SPINE WITHOUT CONTRAST TECHNIQUE: Multidetector CT imaging of the cervical spine was performed without intravenous contrast. Multiplanar CT image reconstructions were also generated. COMPARISON:  None. FINDINGS: Alignment: Normal Skull base and vertebrae: No acute fracture. No primary bone lesion or focal pathologic process. Soft tissues  and spinal canal: No prevertebral fluid or swelling. No visible canal hematoma. Disc levels:  Maintained. Upper chest: Negative Other: None IMPRESSION: Normal study. Electronically Signed   By: Rolm Baptise M.D.   On: 01/18/2021 01:42    Procedures Procedures   Medications Ordered in ED Medications  ketorolac (TORADOL) 30 MG/ML injection 30 mg (30 mg Intramuscular Given 01/18/21 0028)    ED Course  I have reviewed the triage vital signs and the nursing notes.  Pertinent labs & imaging results that were available during my care of the patient were reviewed by me and considered in my medical decision making (see chart for details).    MDM Rules/Calculators/A&P                          Presents with neck pain and heaviness in her right hand.  She has had history of the same.  Reports having this is new.  Clinically she is well-appearing and nontoxic.  Vital signs are reassuring.  Neuro exam is normal.  She has normal strength.  Pain is really in the right brachial plexus.  She has no bony tenderness over the cervical spine although cervical radiculopathy is a consideration.  Nerve impingement at the brachial plexus is also a consideration.  She is previously been treated with naproxen and muscle relaxants.  It is not clear that she is taking this now.  Patient was given anti-inflammatory.  CT neck was obtained shows no evidence of foraminal impingement or acute fracture.  Given that her neuro exam is normal, do not feel she needs emergent MRI.  We will treat with  a Medrol Dosepak and have her follow-up.  After history, exam, and medical workup I feel the patient has been appropriately medically screened and is safe for discharge home. Pertinent diagnoses were discussed with the patient. Patient was given return precautions.  Final Clinical Impression(s) / ED Diagnoses Final diagnoses:  Radiculopathy, unspecified spinal region    Rx / DC Orders ED Discharge Orders         Ordered     methylPREDNISolone (MEDROL DOSEPAK) 4 MG TBPK tablet        01/18/21 0200           Jahking Lesser, Barbette Hair, MD 01/18/21 [phone]

## 2021-01-18 ENCOUNTER — Emergency Department (HOSPITAL_COMMUNITY): Payer: Self-pay

## 2021-01-18 ENCOUNTER — Other Ambulatory Visit: Payer: Self-pay

## 2021-01-18 MED ORDER — METHYLPREDNISOLONE 4 MG PO TBPK
ORAL_TABLET | ORAL | 0 refills | Status: DC
  Filled 2021-01-18: qty 21, 6d supply, fill #0

## 2021-01-18 NOTE — Discharge Instructions (Signed)
You were seen today for neck pain and hand heaviness.  Your work-up is reassuring.  Your CT scan does not show any evidence of fracture.  You could have a pinched nerve either in your neck or your shoulder.  You will be sent home with a steroid Dosepak.  Follow-up with your primary physician if not improving.

## 2021-01-21 ENCOUNTER — Emergency Department (HOSPITAL_COMMUNITY)
Admission: EM | Admit: 2021-01-21 | Discharge: 2021-01-22 | Disposition: A | Discharge status: Home / Self Care | Payer: Self-pay | Attending: Emergency Medicine | Admitting: Emergency Medicine

## 2021-01-21 ENCOUNTER — Encounter (HOSPITAL_COMMUNITY): Payer: Self-pay

## 2021-01-21 ENCOUNTER — Other Ambulatory Visit: Payer: Self-pay

## 2021-01-21 DIAGNOSIS — I10 Essential (primary) hypertension: Secondary | ICD-10-CM | POA: Insufficient documentation

## 2021-01-21 DIAGNOSIS — Z79899 Other long term (current) drug therapy: Secondary | ICD-10-CM | POA: Insufficient documentation

## 2021-01-21 DIAGNOSIS — T380X5A Adverse effect of glucocorticoids and synthetic analogues, initial encounter: Secondary | ICD-10-CM | POA: Insufficient documentation

## 2021-01-21 DIAGNOSIS — Z7984 Long term (current) use of oral hypoglycemic drugs: Secondary | ICD-10-CM | POA: Insufficient documentation

## 2021-01-21 DIAGNOSIS — E119 Type 2 diabetes mellitus without complications: Secondary | ICD-10-CM | POA: Insufficient documentation

## 2021-01-21 DIAGNOSIS — D689 Coagulation defect, unspecified: Secondary | ICD-10-CM | POA: Insufficient documentation

## 2021-01-21 DIAGNOSIS — F419 Anxiety disorder, unspecified: Secondary | ICD-10-CM | POA: Insufficient documentation

## 2021-01-21 LAB — CBG MONITORING, ED: Glucose-Capillary: 109 mg/dL — ABNORMAL HIGH (ref 70–99)

## 2021-01-21 NOTE — ED Triage Notes (Signed)
Emergency Medicine Provider Triage Evaluation Note  Rebecca Sharp , a 50 y.o. female  was evaluated in triage.  Pt complains of anxiety and feeling shaky.  No SI, HI, auditory hallucination, visual hallucinations.    Has been taking steroids over the last few days due to back pain.  No back pain at present.      Spanish interpretor was used for this interview.    Review of Systems  Positive: Anxious,  Negative: Chest pain, shob, SI, HI, AVH   Physical Exam  BP 118/78 (BP Location: Left Arm)   Pulse 72   Temp 98.2 F (36.8 C) (Oral)   Resp 17   SpO2 97%  Gen:   Awake, no distress   HEENT:  Atraumatic  Resp:  Normal effort, speaks in full complete sentences,  Cardiac:  Normal rate  MSK:   Moves extremities without difficulty  Neuro:  Speech clear   Medical Decision Making  Medically screening exam initiated at 8:59 PM.  Appropriate orders placed.  Uvaldo Rising was informed that the remainder of the evaluation will be completed by another provider, this initial triage assessment does not replace that evaluation, and the importance of remaining in the ED until their evaluation is complete.  Clinical Impression   Anxiety.  The patient appears stable so that the remainder of the work up may be completed by another provider.      Haskel Schroeder, [place] 01/21/21 2106

## 2021-01-21 NOTE — ED Notes (Signed)
interpretor used Donald Pore [id number] at this time. Determined that pt is taking steroids for her back pain and since she has been taking them she has not felt well. She feels shaky, nauseated, and desperate, and concerned about her diabetes. C/o pain to the neck area 4/10 only hurts while she is at work

## 2021-01-21 NOTE — ED Notes (Signed)
Pt does not speak english waiting on translator machine

## 2021-01-21 NOTE — ED Triage Notes (Addendum)
Pt reports that she suffers from anxiety and has not been taking her medication. States that she was told it could help with anxiety but cause other problems and she is scared to take it. Reports feeling shaky.  Pt reports that she was seen Monday for back pain and was placed on a steroid.

## 2021-01-22 NOTE — ED Notes (Signed)
Provider at bedside at this time

## 2021-01-23 NOTE — ED Provider Notes (Signed)
[facility] EMERGENCY DEPARTMENT Provider Note   CSN: [id number] Arrival date & time: 01/21/21  2015     History Chief Complaint  Patient presents with  . Anxiety    Rebecca Sharp is a 50 y.o. female.  50 year old female presents to the emergency department for evaluation of increased anxiety.  She states that she has noted worsening anxiety since starting a Medrol Dosepak.  Was previously prescribed Zoloft for her anxiety, but did not like how it made her feel so she discontinued it.  She is not on any other maintenance medication for her anxiety symptoms.  She expresses concern that the Medrol Dosepak is also causing her sugar levels to be elevated.  The highest CBG she has recorded at home after eating a meal is 149.  She has also noticed that she has increased burping and belching after taking methylprednisolone.  Denies polyuria, polydipsia, fever, vomiting.  The history is provided by the patient. No language interpreter was used.  Anxiety       Past Medical History:  Diagnosis Date  . AMA (advanced maternal age) multigravida 79+   . Anemia   . Diabetes mellitus without complication ([place])   . Ganglion cyst 01/25/2017   Left ankle  . Kidney stones 2013   With pregnancy   . Language barrier   . Obese   . Obesity 01/25/2017  . Pregnancy induced hypertension    at end of last 2 pregnancies    Patient Active Problem List   Diagnosis Date Noted  . Obesity 01/25/2017  . Ganglion cyst 01/25/2017  . Prediabetes 02/17/2016  . Essential hypertension 08/19/2015  . Kidney stones     Past Surgical History:  Procedure Laterality Date  . NO PAST SURGERIES       OB History    Gravida  3   Para  3   Term  3   Preterm  0   AB  0   Living  3     SAB  0   IAB  0   Ectopic  0   Multiple  0   Live Births  3           Family History  Problem Relation Age of Onset  . Diabetes Father   . Hypertension Father     Social  History   Tobacco Use  . Smoking status: Never Smoker  . Smokeless tobacco: Never Used  Vaping Use  . Vaping Use: Never used  Substance Use Topics  . Alcohol use: No    Alcohol/week: 0.0 standard drinks  . Drug use: No    Home Medications Prior to Admission medications   Medication Sig Start Date End Date Taking? Authorizing Provider  betamethasone dipropionate 0.05 % cream APPLY TOPICALLY 2 (TWO) TIMES DAILY. 05/21/20 05/21/21  Gildardo Pounds, NP  betamethasone, augmented, (DIPROLENE) 0.05 % lotion Apply topically 2 (two) times daily. 05/22/20   Gildardo Pounds, NP  Blood Glucose Monitoring Suppl (TRUE METRIX METER) w/Device KIT 1 each by Does not apply route 2 (two) times daily. 08/21/19   Argentina Donovan, PA-C  ferrous sulfate 325 (65 FE) MG EC tablet Take 1 tablet (325 mg total) by mouth 2 (two) times daily. 11/02/20 01/31/21  Jaynee Eagles, PA-C  ferrous sulfate 325 (65 FE) MG tablet TAKE 1 TABLET (325 MG TOTAL) BY MOUTH 2 (TWO) TIMES DAILY. 11/02/20 11/02/21  Jaynee Eagles, PA-C  ferrous sulfate 325 (65 FE) MG tablet TAKE 1 TABLET (  325 MG TOTAL) BY MOUTH EVERY OTHER DAY. 09/27/20 09/27/21  Gildardo Pounds, NP  ferrous sulfate 325 (65 FE) MG tablet TAKE 1 TABLET (325 MG TOTAL) BY MOUTH EVERY OTHER DAY. 05/21/20 05/21/21  Gildardo Pounds, NP  glucose blood (TRUE METRIX BLOOD GLUCOSE TEST) test strip Use as instructed 02/20/20   Gildardo Pounds, NP  hydrOXYzine (ATARAX/VISTARIL) 25 MG tablet TAKE 1/2-1 TABLET BY MOUTH EVERY 8 (EIGHT) HOURS AS NEEDED FOR ANXIETY. 09/27/20 09/27/21  Gildardo Pounds, NP  lisinopril (ZESTRIL) 10 MG tablet TAKE 1 TABLET (10 MG TOTAL) BY MOUTH DAILY. 08/24/20 08/24/21  Gildardo Pounds, NP  loperamide (IMODIUM) 2 MG capsule Take 1 capsule (2 mg total) by mouth 4 (four) times daily as needed for diarrhea or loose stools. 11/08/20   Hazel Sams, PA-C  metFORMIN (GLUCOPHAGE) 500 MG tablet TAKE 2 TABLETS (1,000 MG TOTAL) BY MOUTH 2 (TWO) TIMES DAILY WITH A MEAL. Patient  taking differently: Take 1,000 mg by mouth 2 (two) times daily with a meal. Pt. Is taking one tablet 2x a day. 05/21/20 05/21/21  Gildardo Pounds, NP  methylPREDNISolone (MEDROL DOSEPAK) 4 MG TBPK tablet Take as directed on packet 01/18/21   Horton, Barbette Hair, MD  naproxen (NAPROSYN) 500 MG tablet Take 1 tablet (500 mg total) by mouth 2 (two) times daily with a meal. 08/26/20   Jaynee Eagles, PA-C  senna-docusate (SENOKOT-S) 8.6-50 MG tablet TAKE 2 TABLETS BY MOUTH AT BEDTIME. 10/31/20 10/31/21  Gildardo Pounds, NP  sertraline (ZOLOFT) 50 MG tablet Take 1 tablet (50 mg total) by mouth daily. Patient not taking: Reported on 09/27/2020 05/05/20   Jaynee Eagles, PA-C  tiZANidine (ZANAFLEX) 4 MG tablet TAKE 1 TABLET (4 MG TOTAL) BY MOUTH EVERY 8 (EIGHT) HOURS AS NEEDED. 08/26/20 08/26/21  Jaynee Eagles, PA-C  TRUEplus Lancets 28G MISC USE AS DIRECTED 2 (TWO) TIMES DAILY. 10/29/20 10/29/21  Gildardo Pounds, NP  Zinc 100 MG TABS Take by mouth.    [provider]  atorvastatin (LIPITOR) 20 MG tablet Take 1 tablet (20 mg total) by mouth daily. Patient not taking: Reported on 09/27/2020 05/22/20 11/08/20  Gildardo Pounds, NP    Allergies    Patient has no known allergies.  Review of Systems   Review of Systems  Ten systems reviewed and are negative for acute change, except as noted in the HPI.    Physical Exam Updated Vital Signs BP 131/80 (BP Location: Left Arm)   Pulse 70   Temp (!) 97.5 F (36.4 C) (Oral)   Resp 20   Ht 4' 11" (1.499 m)   Wt 79.4 kg   LMP 01/20/2021   SpO2 99%   BMI 35.35 kg/m   Physical Exam Vitals and nursing note reviewed.  Constitutional:      General: She is not in acute distress.    Appearance: She is well-developed. She is not diaphoretic.  HENT:     Head: Normocephalic and atraumatic.  Eyes:     General: No scleral icterus.    Conjunctiva/sclera: Conjunctivae normal.  Cardiovascular:     Rate and Rhythm: Normal rate and regular rhythm.     Pulses: Normal  pulses.  Pulmonary:     Effort: Pulmonary effort is normal. No respiratory distress.     Breath sounds: No stridor. No wheezing.  Musculoskeletal:        General: Normal range of motion.     Cervical back: Normal range of motion.  Skin:  General: Skin is warm and dry.     Coloration: Skin is not pale.     Findings: No erythema or rash.  Neurological:     Mental Status: She is alert and oriented to person, place, and time.  Psychiatric:        Behavior: Behavior normal.     ED Results / Procedures / Treatments   Labs (all labs ordered are listed, but only abnormal results are displayed) Labs Reviewed  CBG MONITORING, ED - Abnormal; Notable for the following components:      Result Value   Glucose-Capillary 109 (*)    All other components within normal limits    EKG None  Radiology No results found.  Procedures Procedures   Medications Ordered in ED Medications - No data to display  ED Course  I have reviewed the triage vital signs and the nursing notes.  Pertinent labs & imaging results that were available during my care of the patient were reviewed by me and considered in my medical decision making (see chart for details).    MDM Rules/Calculators/A&P                          50 year old female presenting with adverse reaction to Medrol Dosepak.  While she expresses concern that it is causing her sugar levels to be elevated, her CBG today is reassuring.  I have explained to the patient that steroids can commonly cause difficulty sleeping and heightened anxiety.  Given her underlying history of anxiety, it is very likely that this is exacerbating the symptoms.  Patient also made aware that steroids and NSAIDs may contribute to increased amounts of stomach acid and stomach irritation.  This would account for her episodes of burping and belching after taking this medication.  Have advised that she discuss her symptoms with her doctor to weigh the pros and cons of  completing her remaining course.  No further emergent work-up indicated at this time.  Discharged in stable condition with no unaddressed concerns.   Final Clinical Impression(s) / ED Diagnoses Final diagnoses:  Prednisone adverse reaction, initial encounter    Rx / DC Orders ED Discharge Orders    None       Antonietta Breach, PA-C 29/47/65 [id number]    Delora Fuel, MD 35/46/56 [phone]

## 2021-01-26 NOTE — Progress Notes (Signed)
Patient ID: Rebecca Sharp, female   DOB: 07/10/1971, 50 y.o.   MRN: [id number]   Virtual Visit via Telephone Note  I connected with Rebecca Sharp on 01/27/21 at  3:50 PM EDT by telephone and verified that I am speaking with the correct person using two identifiers.  Location: Patient: home Provider: [facility] office Rebecca Sharp with pacific interpreters translating.     I discussed the limitations, risks, security and privacy concerns of performing an evaluation and management service by telephone and the availability of in person appointments. I also discussed with the patient that there may be a patient responsible charge related to this service. The patient expressed understanding and agreed to proceed.   History of Present Illness: After ED visit 01/21/2021. She went to the ED for SE of steroids.  She is not sleeping well and she did stop the steroids.  She definitely feels they increased her anxiety.  Also, she had not been taking the zoloft prescribed to her bc her sister had negative side effects with it.  She is open to trying something different. Also needs rf BP meds and muscle relaxer.  Last A1C in December and =5.3.  She denies SI/HI.    From ED note:  50 year old female presenting with adverse reaction to Medrol Dosepak.  While she expresses concern that it is causing her sugar levels to be elevated, her CBG today is reassuring.  I have explained to the patient that steroids can commonly cause difficulty sleeping and heightened anxiety.  Given her underlying history of anxiety, it is very likely that this is exacerbating the symptoms.  Patient also made aware that steroids and NSAIDs may contribute to increased amounts of stomach acid and stomach irritation.  This would account for her episodes of burping and belching after taking this medication.  Have advised that she discuss her symptoms with her doctor to weigh the pros and cons of completing her remaining course.  No further  emergent work-up indicated at this time.  Discharged in stable condition with no unaddressed concerns.  Observations/Objective:  NAD.  A&Ox3   Assessment and Plan: 1. Anxiety Removed zoloft from list as she has not been taking it - escitalopram (LEXAPRO) 10 MG tablet; Take 1 tablet (10 mg total) by mouth daily.  Dispense: 90 tablet; Refill: 1 - hydrOXYzine (ATARAX/VISTARIL) 25 MG tablet; TAKE 1/2-1 TABLET BY MOUTH EVERY 8 (EIGHT) HOURS AS NEEDED FOR ANXIETY.  Dispense: 60 tablet; Refill: 2  2. Medication side effect anxiety exacerbated by prednisone but starting to improve  3. Muscle spasm - tiZANidine (ZANAFLEX) 4 MG tablet; TAKE 1 TABLET (4 MG TOTAL) BY MOUTH EVERY 8 (EIGHT) HOURS AS NEEDED.  Dispense: 30 tablet; Refill: 0  4. Essential hypertension - lisinopril (ZESTRIL) 10 MG tablet; TAKE 1 TABLET (10 MG TOTAL) BY MOUTH DAILY.  Dispense: 90 tablet; Refill: 3  5. Language barrier-pacific interpreters used and additional time performing visit was required.    Follow Up Instructions: See PCP in about 2 months   I discussed the assessment and treatment plan with the patient. The patient was provided an opportunity to ask questions and all were answered. The patient agreed with the plan and demonstrated an understanding of the instructions.   The patient was advised to call back or seek an in-person evaluation if the symptoms worsen or if the condition fails to improve as anticipated.  I provided 16 minutes of non-face-to-face time during this encounter.   Georgian Co, PA-C

## 2021-01-27 ENCOUNTER — Encounter: Payer: Self-pay | Admitting: Physician Assistant

## 2021-01-27 ENCOUNTER — Other Ambulatory Visit: Payer: Self-pay

## 2021-01-27 ENCOUNTER — Ambulatory Visit: Payer: Self-pay | Attending: Nurse Practitioner | Admitting: Physician Assistant

## 2021-01-27 DIAGNOSIS — F419 Anxiety disorder, unspecified: Secondary | ICD-10-CM

## 2021-01-27 DIAGNOSIS — T887XXA Unspecified adverse effect of drug or medicament, initial encounter: Secondary | ICD-10-CM

## 2021-01-27 DIAGNOSIS — Z789 Other specified health status: Secondary | ICD-10-CM

## 2021-01-27 DIAGNOSIS — M62838 Other muscle spasm: Secondary | ICD-10-CM

## 2021-01-27 DIAGNOSIS — I10 Essential (primary) hypertension: Secondary | ICD-10-CM

## 2021-01-27 MED ORDER — LISINOPRIL 10 MG PO TABS
ORAL_TABLET | Freq: Every day | ORAL | 3 refills | Status: DC
  Filled 2021-01-27: qty 30, 30d supply, fill #0
  Filled 2021-03-01: qty 30, 30d supply, fill #1
  Filled 2021-04-04: qty 30, 30d supply, fill #2
  Filled 2021-05-04: qty 30, 30d supply, fill #3
  Filled 2021-06-01: qty 30, 30d supply, fill #4
  Filled 2021-07-04: qty 30, 30d supply, fill #5
  Filled 2021-08-04: qty 30, 30d supply, fill #6
  Filled 2021-09-02: qty 30, 30d supply, fill #7
  Filled 2021-10-05: qty 30, 30d supply, fill #8
  Filled 2021-11-15: qty 30, 30d supply, fill #0
  Filled 2021-12-27: qty 30, 30d supply, fill #1

## 2021-01-27 MED ORDER — TIZANIDINE HCL 4 MG PO TABS
ORAL_TABLET | ORAL | 0 refills | Status: DC
  Filled 2021-01-27: qty 30, 10d supply, fill #0

## 2021-01-27 MED ORDER — HYDROXYZINE HCL 25 MG PO TABS
ORAL_TABLET | ORAL | 2 refills | Status: DC
  Filled 2021-01-27: qty 60, 20d supply, fill #0

## 2021-01-27 MED ORDER — ESCITALOPRAM OXALATE 10 MG PO TABS
10.0000 mg | ORAL_TABLET | Freq: Every day | ORAL | 1 refills | Status: DC
  Filled 2021-01-27: qty 30, 30d supply, fill #0
  Filled 2021-04-07: qty 30, 30d supply, fill #1

## 2021-02-01 ENCOUNTER — Other Ambulatory Visit: Payer: Self-pay

## 2021-03-01 ENCOUNTER — Other Ambulatory Visit: Payer: Self-pay

## 2021-03-01 ENCOUNTER — Other Ambulatory Visit: Payer: Self-pay | Admitting: Nurse Practitioner

## 2021-03-01 DIAGNOSIS — E1165 Type 2 diabetes mellitus with hyperglycemia: Secondary | ICD-10-CM

## 2021-03-01 MED ORDER — TRUE METRIX BLOOD GLUCOSE TEST VI STRP
ORAL_STRIP | 12 refills | Status: DC
  Filled 2021-03-01: qty 100, 25d supply, fill #0
  Filled 2021-06-01: qty 100, 25d supply, fill #1

## 2021-03-01 MED FILL — Ferrous Sulfate Tab 325 MG (65 MG Elemental Fe): ORAL | 30 days supply | Qty: 60 | Fill #0 | Status: AC

## 2021-03-01 MED FILL — Betamethasone Dipropionate Cream 0.05%: CUTANEOUS | 7 days supply | Qty: 45 | Fill #0 | Status: AC

## 2021-03-02 ENCOUNTER — Ambulatory Visit: Payer: Self-pay | Admitting: Physician Assistant

## 2021-03-02 ENCOUNTER — Other Ambulatory Visit: Payer: Self-pay

## 2021-03-02 VITALS — BP 115/70 | HR 86 | Temp 98.2°F | Resp 18 | Ht 59.0 in | Wt 180.0 lb

## 2021-03-02 DIAGNOSIS — E6609 Other obesity due to excess calories: Secondary | ICD-10-CM

## 2021-03-02 DIAGNOSIS — M25519 Pain in unspecified shoulder: Secondary | ICD-10-CM

## 2021-03-02 DIAGNOSIS — M542 Cervicalgia: Secondary | ICD-10-CM

## 2021-03-02 DIAGNOSIS — N898 Other specified noninflammatory disorders of vagina: Secondary | ICD-10-CM

## 2021-03-02 DIAGNOSIS — F439 Reaction to severe stress, unspecified: Secondary | ICD-10-CM

## 2021-03-02 DIAGNOSIS — K219 Gastro-esophageal reflux disease without esophagitis: Secondary | ICD-10-CM

## 2021-03-02 DIAGNOSIS — F411 Generalized anxiety disorder: Secondary | ICD-10-CM

## 2021-03-02 DIAGNOSIS — Z6836 Body mass index (BMI) 36.0-36.9, adult: Secondary | ICD-10-CM

## 2021-03-02 MED ORDER — NAPROXEN 500 MG PO TABS
500.0000 mg | ORAL_TABLET | Freq: Two times a day (BID) | ORAL | 0 refills | Status: DC
  Filled 2021-03-02: qty 30, 15d supply, fill #0

## 2021-03-02 MED ORDER — OMEPRAZOLE 20 MG PO CPDR
20.0000 mg | DELAYED_RELEASE_CAPSULE | Freq: Every day | ORAL | 3 refills | Status: DC
  Filled 2021-03-02: qty 30, 30d supply, fill #0

## 2021-03-02 MED ORDER — CLOTRIMAZOLE 1 % EX CREA
1.0000 "application " | TOPICAL_CREAM | Freq: Two times a day (BID) | CUTANEOUS | 0 refills | Status: DC
  Filled 2021-03-02: qty 30, 15d supply, fill #0

## 2021-03-02 NOTE — Patient Instructions (Signed)
For your neck and shoulder pain, you can continue to take the naproxen, I sent a new prescription to your pharmacy, and use the muscle relaxers.  I encourage you to use massage, gentle stretching, and work on better control of your stress and anxiety.  In order to control your stress and anxiety, I strongly encourage you to begin the Lexapro that was prescribed by your primary care provider.  You will take this once a day in the morning.  It may take 3 to 4 weeks before you notice any improvement.  You can use the hydroxyzine at bedtime if you have difficulty falling asleep.  While you are taking the naproxen, you need to protect your stomach and use Prilosec due to your history of heartburn.  Please let us know if there is anything else we can do for you.  Kennieth Rad, PA-C Physician Assistant [facility] [email or web address]    Control del estrs en los adultos Managing Stress, Adult Sentir un cierto nivel de estrs es normal. El estrs ayuda a que el cuerpo y la mente se preparen para enfrentar las exigencias de la vida. Las hormonas del estrs pueden motivarlo a hacer bien su trabajo y a cumplir con sus responsabilidades. Sin embargo, el estrs intenso o prolongado (crnico) puede Print production planner su salud mental y fsica. El estrs crnico aumenta el riesgo de sufrir ansiedad, depresin y otros problemas de [place], como problemas digestivos, dolores musculares, enfermedades cardacas, hipertensin arterial y accidente cerebrovascular. Cules son las causas? Las causas frecuentes de estrs son las siguientes:  Las exigencias del trabajo, como las fechas lmite, la sensacin de Fish farm manager en exceso o el trabajar muchas horas.  Las Paramedic, como problemas de dinero, desacuerdos con un cnyuge o problemas en la crianza de los nios.  Las presiones por [employer] en la vida, como un divorcio, [place], prdida de un ser  querido o enfermedad crnica. Usted puede correr un riesgo ms alto de tener problemas relacionados con el estrs si no duerme lo suficiente, no tiene buena salud, no tiene [employer] o tiene un trastorno de salud mental como ansiedad o depresin. Cmo reconocer el estrs El estrs puede hacer que usted:  Tenga dificultad para dormir.  Se sienta triste, ansioso, irritable o abrumado.  Pierda el apetito.  Coma en exceso o tenga deseos de comer alimentos poco saludables.  Tenga deseos de consumir drogas o alcohol. El estrs tambin puede causar sntomas fsicos, por ejemplo:  Msculos tensos y doloridos, especialmente en los hombros y el cuello.  Dolores de [place].  Dificultad para respirar.  Frecuencia cardaca ms rpida.  Dolor de [place], nuseas o vmitos.  Diarrea o estreimiento.  Dificultad para concentrarse. Siga estas instrucciones en su casa: Estilo de vida  Identifique el origen del estrs y su reaccin a este. Consulte a un terapeuta que lo ayude a Scientist, clinical (histocompatibility and immunogenetics).  Cuando se presenten acontecimientos estresantes: ? Hable de ello con su familia, amigos o compaeros de trabajo. ? Trate de pensar de un modo realista los acontecimientos estresantes y de no ignorarlos ni Horticulturist, commercial. ? Intente encontrar los aspectos positivos en una situacin estresante y de no enfocarse en los negativos. ? Reduzca sus responsabilidades en el trabajo y en su casa, si es posible. Pida ayuda a sus amigos o familiares si la necesita.  Encuentre formas de lidiar con Dealer, tales como: ? Meditacin. ? Respiracin profunda. ? Yoga o tai chi. ? Relajacin muscular progresiva. ? Hacer  arte, tocar msica o leer. ? Hacerse tiempo para Data processing manager divertidas. ? Pasar tiempo con amigos y familiares.  Recibir apoyo de familiares, amigos o recursos espirituales. Comida y bebida  Siga una dieta saludable. Esto puede comprender lo  siguiente: ? Consuma alimentos ricos en fibra, como frijoles, cereales integrales, y frutas y verduras frescas. ? Limite el consumo de alimentos ricos en grasas y azcares procesados, como alimentos fritos o dulces.  No saltee comidas ni coma en exceso.  Beba suficiente lquido como para Theatre manager la orina de color amarillo plido. Consumo de alcohol  No beba alcohol si: ? Su mdico le indica no hacerlo. ? Est embarazada, puede estar embarazada o est tratando de quedar embarazada.  El consumo de alcohol es una forma en la que algunas personas tratan de Public house manager el estrs. Esto puede ser peligroso, por lo que si bebe alcohol: ? Limite la cantidad que bebe:  De 0 a 1 medida por da para las mujeres.  De 0 a 2 medidas por da para los hombres. ? Est atento a la cantidad de alcohol que hay en las bebidas que toma. En los [place], una medida equivale a una botella de cerveza de 12oz (327m), un vaso de vino de 5oz (1479m o un vaso de una bebida alcohlica de alta graduacin de 1oz (445m Actividad  Incluya 30m53mos de ejercicio en su cronograma diario. El ejercicio es un buen recurso para reducir el eDealernclLloyd Hugersu da para realizar una actividad que le resulte relajante. Pruebe caminar, andar en bicicleta, leer un libro o escuchar msica.  Programe su tiempo de una manera que reduzca el estrs y mantenga un cronograma uniforme. D prioridad a las tareas ms importantes.   Instrucciones generales  Duerma lo suficiente. Trate de irse a dormir y levaHoliday representativea misma hora todos los [place] los medicamentos de venta libre y los recetados solamente como se lo haya indicado el mdico.  No consuma ningn producto que contenga nicotina o tabaco, como cigarrillos, cigarrillos electrnicos y tabaco de mascHigher education careers adviser necesita ayuda para dejar de fumar, consulte al mdico.  No consuma drogas ni fume para sobrellevar el estrs.  Concurra a todas las  visitas de seguimiento como se lo haya indicado el mdico. Esto es importante. Dnde buscar apoyo  Converse con el mdico sobre cmo controlar el estrs o buscar un grupo de apoyo.  Busque un terapeuta para que trabaje con usted sobre las tcnicas de control del estrs. Comunquese con un mdico si:  Sus sntomas de estrGeneticist, molecularo puede controlar el estrs en su casa.  Tiene dificultades para dejar de consumir drogas o alcohol. Solicite ayuda de inmediato si:  Podra ser un peligro para usted mismo o para los dems.  Tiene cualquier pensamiento de muerte o suicidio. Si alguna vez siente que puede lastimarse o lastMarket researchertraProducer, television/film/videotiene pensamientos de poner fin a su vida, busque ayuda de inmediato. Puede dirigirse al servicio de emergencias ms cercano o comunicarse con:  Servicio de emergencias de su localidad (911 en [place]  Una lnea de asistencia al suicida y atenFreight forwardercrisis, como National Suicide Prevention Lifeline (LneaLynnl 1-80224 734 0251t disponible las 24 horas del da. Resumen  Sentir un cierto nivel de estrs es normal, pero el estrs intenso o prolongado (crnico) puede afecPrint production plannersalud mental y fsica.  El estrs crnico puede aumentar el riesgo de sufrir ansiedad, depresin y otro[place]rowns[place]  de salud, como problemas digestivos, dolores musculares, enfermedades cardacas, hipertensin arterial y accidente cerebrovascular.  Usted puede correr un riesgo ms alto de tener problemas relacionados con el estrs si no duerme lo suficiente, no tiene buena salud, carece de [employer] o tiene un trastorno de salud mental como ansiedad o depresin.  Identifique el origen del estrs y su reaccin a este. Trate de [employer] acontecimientos estresantes con familiares, amigos o compaeros de [place], de Pension scheme manager un mtodo para sobrellevar la situacin o de obtener 6 de recursos espirituales.  Si necesita  ms ayuda, hable con el mdico para encontrar un grupo de apoyo o un terapeuta de salud mental. Esta informacin no tiene Marine scientist el consejo del mdico. Asegrese de hacerle al mdico cualquier pregunta que tenga. Document Revised: 06/18/2019 Document Reviewed: 06/18/2019 Elsevier Patient Education  2021 [employer].

## 2021-03-02 NOTE — Progress Notes (Signed)
Established Patient Office Visit  Subjective:  Patient ID: Rebecca Sharp, female    DOB: 09-14-1971  Age: 50 y.o. MRN: [id number]  CC:  Chief Complaint  Patient presents with  . Neck Pain    3 months    HPI Rebecca Sharp repots that she has been having neck pan for the past three months, denies injury/trauma.  States that she thinks it is from stress and being tired.  States that she had a CT of her back at the beginning of April for this complaint  CLINICAL DATA:  Cervical radiculopathy  EXAM: CT CERVICAL SPINE WITHOUT CONTRAST  TECHNIQUE: Multidetector CT imaging of the cervical spine was performed without intravenous contrast. Multiplanar CT image reconstructions were also generated.  COMPARISON:  None.  FINDINGS: Alignment: Normal  Skull base and vertebrae: No acute fracture. No primary bone lesion or focal pathologic process.  Soft tissues and spinal canal: No prevertebral fluid or swelling. No visible canal hematoma.  Disc levels:  Maintained.  Upper chest: Negative  Other: None  IMPRESSION: Normal study.   Electronically Signed   By: Rolm Baptise M.D.   On: 01/18/2021 01:42  States that she has been doing stretching without relief , and used naproxen with relief.  Is using the tizanidine at bedtime with some relief.  Does endorse that she gets heartburn and acid reflux , states this occurs several times a week, states that she does not use anything for relief.  States that she takes a supplement for anxiety, sweet orange and magnesium.  States that she is not taking the lexapro, did not start it, states that she is afraid it is going to make it difficult for her to sleep.  States that will use the hydroxyzine when she has "a lot of anxiety" but does not take it often because it makes her sleepy.  Due to language barrier, an interpreter was present during the history-taking and subsequent discussion (and for part of the  physical exam) with this patient.   Past Medical History:  Diagnosis Date  . AMA (advanced maternal age) multigravida 74+   . Anemia   . Diabetes mellitus without complication ([place])   . Ganglion cyst 01/25/2017   Left ankle  . Kidney stones 2013   With pregnancy   . Language barrier   . Obese   . Obesity 01/25/2017  . Pregnancy induced hypertension    at end of last 2 pregnancies    Past Surgical History:  Procedure Laterality Date  . NO PAST SURGERIES      Family History  Problem Relation Age of Onset  . Diabetes Father   . Hypertension Father     Social History   Socioeconomic History  . Marital status: Single    Spouse name: Not on file  . Number of children: 3  . Years of education: <8th grade  . Highest education level: 6th grade  Occupational History  . Occupation: Airline pilot: [employer]  Tobacco Use  . Smoking status: Never Smoker  . Smokeless tobacco: Never Used  Vaping Use  . Vaping Use: Never used  Substance and Sexual Activity  . Alcohol use: No    Alcohol/week: 0.0 standard drinks  . Drug use: No  . Sexual activity: Yes    Birth control/protection: Condom    Comment: No current boyfriend or spouse-not interested  Other Topics Concern  . Not on file  Social History Narrative  . Not on file  Social Determinants of Health   Financial Resource Strain: Not on file  Food Insecurity: Not on file  Transportation Needs: Not on file  Physical Activity: Not on file  Stress: Not on file  Social Connections: Not on file  Intimate Partner Violence: Not on file    Outpatient Medications Prior to Visit  Medication Sig Dispense Refill  . betamethasone dipropionate 0.05 % cream APPLY TOPICALLY 2 (TWO) TIMES DAILY. 60 g 1  . betamethasone, augmented, (DIPROLENE) 0.05 % lotion Apply topically 2 (two) times daily. 100 mL 1  . Blood Glucose Monitoring Suppl (TRUE METRIX METER) w/Device KIT 1 each by Does not apply route 2 (two) times daily. 1 kit 0   . escitalopram (LEXAPRO) 10 MG tablet Take 1 tablet (10 mg total) by mouth daily. 90 tablet 1  . ferrous sulfate 325 (65 FE) MG tablet TAKE 1 TABLET (325 MG TOTAL) BY MOUTH EVERY OTHER DAY. 180 tablet 1  . glucose blood (TRUE METRIX BLOOD GLUCOSE TEST) test strip Use as instructed 100 each 12  . hydrOXYzine (ATARAX/VISTARIL) 25 MG tablet TAKE 1/2-1 TABLET BY MOUTH EVERY 8 (EIGHT) HOURS AS NEEDED FOR ANXIETY. 60 tablet 2  . lisinopril (ZESTRIL) 10 MG tablet TAKE 1 TABLET (10 MG TOTAL) BY MOUTH DAILY. 90 tablet 3  . loperamide (IMODIUM) 2 MG capsule Take 1 capsule (2 mg total) by mouth 4 (four) times daily as needed for diarrhea or loose stools. 12 capsule 0  . metFORMIN (GLUCOPHAGE) 500 MG tablet TAKE 2 TABLETS (1,000 MG TOTAL) BY MOUTH 2 (TWO) TIMES DAILY WITH A MEAL. (Patient taking differently: Take 1,000 mg by mouth 2 (two) times daily with a meal. Pt. Is taking one tablet 2x a day.) 360 tablet 0  . senna-docusate (SENOKOT-S) 8.6-50 MG tablet TAKE 2 TABLETS BY MOUTH AT BEDTIME. 60 tablet 3  . tiZANidine (ZANAFLEX) 4 MG tablet TAKE 1 TABLET (4 MG TOTAL) BY MOUTH EVERY 8 (EIGHT) HOURS AS NEEDED. 30 tablet 0  . TRUEplus Lancets 28G MISC USE AS DIRECTED 2 (TWO) TIMES DAILY. 100 each 5  . Zinc 100 MG TABS Take by mouth.    . ferrous sulfate 325 (65 FE) MG EC tablet Take 1 tablet (325 mg total) by mouth 2 (two) times daily. 180 tablet 0  . ferrous sulfate 325 (65 FE) MG tablet TAKE 1 TABLET (325 MG TOTAL) BY MOUTH 2 (TWO) TIMES DAILY. 180 tablet 0  . ferrous sulfate 325 (65 FE) MG tablet TAKE 1 TABLET (325 MG TOTAL) BY MOUTH EVERY OTHER DAY. 180 tablet 1  . naproxen (NAPROSYN) 500 MG tablet Take 1 tablet (500 mg total) by mouth 2 (two) times daily with a meal. 30 tablet 0  . methylPREDNISolone (MEDROL DOSEPAK) 4 MG TBPK tablet Take as directed on packet 21 tablet 0   No facility-administered medications prior to visit.    No Known Allergies  ROS Review of Systems  Constitutional: Negative for  chills and fever.  HENT: Negative.   Eyes: Negative.   Respiratory: Negative for shortness of breath.   Cardiovascular: Negative for chest pain.  Gastrointestinal: Negative for abdominal pain, nausea and vomiting.  Endocrine: Negative.   Genitourinary: Negative for dysuria and vaginal discharge.  Musculoskeletal: Positive for arthralgias and neck pain.  Skin: Negative.   Allergic/Immunologic: Negative.   Neurological: Negative for headaches.  Hematological: Negative.   Psychiatric/Behavioral: Negative for dysphoric mood, self-injury, sleep disturbance and suicidal ideas. The patient is nervous/anxious.       Objective:  Physical Exam Vitals and nursing note reviewed.  Constitutional:      Appearance: Normal appearance.  HENT:     Head: Normocephalic and atraumatic.     Right Ear: External ear normal.     Left Ear: External ear normal.     Nose: Nose normal.     Mouth/Throat:     Mouth: Mucous membranes are moist.     Pharynx: Oropharynx is clear.  Eyes:     Extraocular Movements: Extraocular movements intact.     Conjunctiva/sclera: Conjunctivae normal.     Pupils: Pupils are equal, round, and reactive to light.  Cardiovascular:     Rate and Rhythm: Normal rate and regular rhythm.     Pulses: Normal pulses.     Heart sounds: Normal heart sounds.  Pulmonary:     Effort: Pulmonary effort is normal.     Breath sounds: Normal breath sounds.  Musculoskeletal:        General: Normal range of motion.     Right shoulder: Tenderness present. No swelling. Normal range of motion. Normal strength.     Left shoulder: Tenderness present. No swelling. Normal range of motion. Normal strength.     Cervical back: Full passive range of motion without pain, normal range of motion and neck supple. Muscular tenderness present.  Skin:    General: Skin is warm and dry.  Neurological:     General: No focal deficit present.     Mental Status: She is alert and oriented to person, place, and  time.  Psychiatric:        Mood and Affect: Mood normal.        Behavior: Behavior normal.        Thought Content: Thought content normal.        Judgment: Judgment normal.     BP 115/70 (BP Location: Left Arm, Patient Position: Sitting, Cuff Size: Normal)   Pulse 86   Temp 98.2 F (36.8 C) (Oral)   Resp 18   Ht 4' 11"  (1.499 m)   Wt 180 lb (81.6 kg)   LMP 02/19/2021   SpO2 100%   BMI 36.36 kg/m  Wt Readings from Last 3 Encounters:  03/02/21 180 lb (81.6 kg)  01/21/21 175 lb (79.4 kg)  01/17/21 176 lb (79.8 kg)     Health Maintenance Due  Topic Date Due  . COVID-19 Vaccine (1) Never done  . Hepatitis C Screening  Never done  . COLONOSCOPY (Pts 45-43yr Insurance coverage will need to be confirmed)  Never done  . PAP SMEAR-Modifier  10/16/2020    There are no preventive care reminders to display for this patient.  Lab Results  Component Value Date   TSH 1.330 04/03/2018   Lab Results  Component Value Date   WBC 8.2 01/17/2021   HGB 10.8 (L) 01/17/2021   HCT 33.4 (L) 01/17/2021   MCV 88.4 01/17/2021   PLT 243 01/17/2021   Lab Results  Component Value Date   NA 134 (L) 01/17/2021   K 3.4 (L) 01/17/2021   CO2 22 01/17/2021   GLUCOSE 109 (H) 01/17/2021   BUN 16 01/17/2021   CREATININE 0.86 01/17/2021   BILITOT 0.3 01/17/2021   ALKPHOS 46 01/17/2021   AST 16 01/17/2021   ALT 14 01/17/2021   PROT 7.1 01/17/2021   ALBUMIN 3.6 01/17/2021   CALCIUM 8.7 (L) 01/17/2021   ANIONGAP 6 01/17/2021   Lab Results  Component Value Date   CHOL 166 12/02/2019   Lab Results  Component Value Date   HDL 35 (L) 12/02/2019   Lab Results  Component Value Date   LDLCALC 102 (H) 12/02/2019   Lab Results  Component Value Date   TRIG 163 (H) 12/02/2019   Lab Results  Component Value Date   CHOLHDL 4.7 (H) 12/02/2019   Lab Results  Component Value Date   HGBA1C 5.3 09/27/2020      Assessment & Plan:   Problem List Items Addressed This Visit       Digestive   Gastroesophageal reflux disease without esophagitis   Relevant Medications   omeprazole (PRILOSEC) 20 MG capsule     Other   Class 2 obesity due to excess calories with body mass index (BMI) of 36.0 to 36.9 in adult   Neck and shoulder pain - Primary   Relevant Medications   naproxen (NAPROSYN) 500 MG tablet   GAD (generalized anxiety disorder)   Stress    Other Visit Diagnoses    Itching in the vaginal area       Relevant Medications   clotrimazole (CLOTRIMAZOLE ANTI-FUNGAL) 1 % cream    1. Neck and shoulder pain Trial naproxen, patient encouraged to work on reducing stress levels, gentle stretching, increase hydration.  Red flags given for prompt reevaluation - naproxen (NAPROSYN) 500 MG tablet; Take 1 tablet (500 mg total) by mouth 2 (two) times daily with a meal.  Dispense: 30 tablet; Refill: 0  2. GAD (generalized anxiety disorder) Patient strongly encouraged to do trial of Lexapro as prescribed by PCP.  Reassurance given, use hydroxyzine to help with insomnia if needed  3. Stress Patient education given on stress relieving activities  4. Gastroesophageal reflux disease without esophagitis Patient encouraged to use Prilosec on a daily basis - omeprazole (PRILOSEC) 20 MG capsule; Take 1 capsule (20 mg total) by mouth daily.  Dispense: 30 capsule; Refill: 3  5. Itching in the vaginal area Patient requested refill, states that she experiences vaginal itching frequently - clotrimazole (CLOTRIMAZOLE ANTI-FUNGAL) 1 % cream; Apply 1 application topically 2 (two) times daily.  Dispense: 30 g; Refill: 0  6. Class 2 obesity due to excess calories with body mass index (BMI) of 36.0 to 36.9 in adult, unspecified whether serious comorbidity present    I have reviewed the patient's medical history (PMH, PSH, Social History, Family History, Medications, and allergies) , and have been updated if relevant. I spent 33 minutes reviewing chart and  face to face time with  patient.      Meds ordered this encounter  Medications  . clotrimazole (CLOTRIMAZOLE ANTI-FUNGAL) 1 % cream    Sig: Apply 1 application topically 2 (two) times daily.    Dispense:  30 g    Refill:  0    Order Specific Question:   Supervising Provider    Answer:   Asencion Noble E [[id number]]  . naproxen (NAPROSYN) 500 MG tablet    Sig: Take 1 tablet (500 mg total) by mouth 2 (two) times daily with a meal.    Dispense:  30 tablet    Refill:  0    Order Specific Question:   Supervising Provider    Answer:   Asencion Noble E [[id number]]  . omeprazole (PRILOSEC) 20 MG capsule    Sig: Take 1 capsule (20 mg total) by mouth daily.    Dispense:  30 capsule    Refill:  3    Order Specific Question:   Supervising Provider    Answer:   Noralyn Pick  Follow-up: Return in about 5 weeks (around 04/07/2021) for [facility].    Loraine Grip Mayers, PA-C

## 2021-03-02 NOTE — Progress Notes (Signed)
Patient has eaten today and patient has taken medication today. Patient reports neck pain for the past 3 months with increased pain throughout the day from working.

## 2021-03-03 ENCOUNTER — Other Ambulatory Visit: Payer: Self-pay

## 2021-03-03 DIAGNOSIS — M542 Cervicalgia: Secondary | ICD-10-CM | POA: Insufficient documentation

## 2021-03-03 DIAGNOSIS — F411 Generalized anxiety disorder: Secondary | ICD-10-CM | POA: Insufficient documentation

## 2021-03-03 DIAGNOSIS — M25519 Pain in unspecified shoulder: Secondary | ICD-10-CM | POA: Insufficient documentation

## 2021-03-03 DIAGNOSIS — K219 Gastro-esophageal reflux disease without esophagitis: Secondary | ICD-10-CM | POA: Insufficient documentation

## 2021-03-03 DIAGNOSIS — F439 Reaction to severe stress, unspecified: Secondary | ICD-10-CM | POA: Insufficient documentation

## 2021-03-04 ENCOUNTER — Other Ambulatory Visit: Payer: Self-pay

## 2021-03-08 ENCOUNTER — Other Ambulatory Visit: Payer: Self-pay

## 2021-03-08 MED FILL — Lancets: 50 days supply | Qty: 100 | Fill #0 | Status: AC

## 2021-03-09 ENCOUNTER — Other Ambulatory Visit: Payer: Self-pay

## 2021-04-04 ENCOUNTER — Other Ambulatory Visit: Payer: Self-pay

## 2021-04-07 ENCOUNTER — Ambulatory Visit: Payer: Self-pay | Attending: Physician Assistant | Admitting: Physician Assistant

## 2021-04-07 ENCOUNTER — Encounter: Payer: Self-pay | Admitting: Physician Assistant

## 2021-04-07 ENCOUNTER — Other Ambulatory Visit: Payer: Self-pay

## 2021-04-07 VITALS — BP 103/69 | HR 86 | Ht 59.0 in | Wt 177.2 lb

## 2021-04-07 DIAGNOSIS — I10 Essential (primary) hypertension: Secondary | ICD-10-CM

## 2021-04-07 DIAGNOSIS — Z789 Other specified health status: Secondary | ICD-10-CM

## 2021-04-07 DIAGNOSIS — E1165 Type 2 diabetes mellitus with hyperglycemia: Secondary | ICD-10-CM

## 2021-04-07 DIAGNOSIS — D649 Anemia, unspecified: Secondary | ICD-10-CM

## 2021-04-07 LAB — POCT GLYCOSYLATED HEMOGLOBIN (HGB A1C): Hemoglobin A1C: 5.6 % (ref 4.0–5.6)

## 2021-04-07 LAB — GLUCOSE, POCT (MANUAL RESULT ENTRY): POC Glucose: 127 mg/dl — AB (ref 70–99)

## 2021-04-07 MED ORDER — METFORMIN HCL 500 MG PO TABS
500.0000 mg | ORAL_TABLET | Freq: Two times a day (BID) | ORAL | 1 refills | Status: DC
  Filled 2021-04-07: qty 60, 30d supply, fill #0

## 2021-04-07 NOTE — Progress Notes (Signed)
Rebecca Sharp, is a 50 y.o. Sharp  UJW:[id number]  FAO:[id number]  DOB - [birth year]  Subjective:  Chief Complaint and HPI: Rebecca Sharp is a 50 y.o. Sharp here today for check up.  She is taking metformin 519m bid.  She is doing well.  She would like to schedule pap.  Other meds have remaining RF for now.      ROS:   Constitutional:  No f/c, No night sweats, No unexplained weight loss. EENT:  No vision changes, No blurry vision, No hearing changes. No mouth, throat, or ear problems.  Respiratory: No cough, No SOB Cardiac: No CP, no palpitations GI:  No abd pain, No N/V/D. GU: No Urinary s/sx Musculoskeletal: No joint pain Neuro: No headache, no dizziness, no motor weakness.  Skin: No rash Endocrine:  No polydipsia. No polyuria.  Psych: Denies SI/HI  No problems updated.  ALLERGIES: No Known Allergies  PAST MEDICAL HISTORY: Past Medical History:  Diagnosis Date   AMA (advanced maternal age) multigravida 35+    Anemia    Diabetes mellitus without complication ([place]    Ganglion cyst 01/25/2017   Left ankle   Kidney stones 2013   With pregnancy    Language barrier    Obese    Obesity 01/25/2017   Pregnancy induced hypertension    at end of last 2 pregnancies    MEDICATIONS AT HOME: Prior to Admission medications   Medication Sig Start Date End Date Taking? Authorizing Provider  betamethasone dipropionate 0.05 % cream APPLY TOPICALLY 2 (TWO) TIMES DAILY. 05/21/20 05/21/21 Yes FGildardo Pounds NP  betamethasone, augmented, (DIPROLENE) 0.05 % lotion Apply topically 2 (two) times daily. 05/22/20  Yes FGildardo Pounds NP  Blood Glucose Monitoring Suppl (TRUE METRIX METER) w/Device KIT 1 each by Does not apply route 2 (two) times daily. 08/21/19  Yes MFreeman CaldronM, PA-C  clotrimazole (CLOTRIMAZOLE ANTI-FUNGAL) 1 % cream Apply 1 application topically 2 (two) times daily. 03/02/21  Yes Mayers, Cari S, PA-C  escitalopram (LEXAPRO) 10 MG tablet Take 1 tablet  (10 mg total) by mouth daily. 01/27/21  Yes MArgentina Donovan PA-C  ferrous sulfate 325 (65 FE) MG tablet TAKE 1 TABLET (325 MG TOTAL) BY MOUTH 2 (TWO) TIMES DAILY. 11/02/20 11/02/21 Yes MJaynee Eagles PA-C  ferrous sulfate 325 (65 FE) MG tablet TAKE 1 TABLET (325 MG TOTAL) BY MOUTH EVERY OTHER DAY. 05/21/20 05/21/21 Yes FGildardo Pounds NP  glucose blood (TRUE METRIX BLOOD GLUCOSE TEST) test strip Use as instructed 03/01/21  Yes FGildardo Pounds NP  hydrOXYzine (ATARAX/VISTARIL) 25 MG tablet TAKE 1/2-1 TABLET BY MOUTH EVERY 8 (EIGHT) HOURS AS NEEDED FOR ANXIETY. 01/27/21 01/27/22 Yes Denese Mentink, ADionne Bucy PA-C  lisinopril (ZESTRIL) 10 MG tablet TAKE 1 TABLET (10 MG TOTAL) BY MOUTH DAILY. 01/27/21 01/27/22 Yes Yasir Kitner, ADionne Bucy PA-C  loperamide (IMODIUM) 2 MG capsule Take 1 capsule (2 mg total) by mouth 4 (four) times daily as needed for diarrhea or loose stools. 11/08/20  Yes GHazel Sams PA-C  naproxen (NAPROSYN) 500 MG tablet Take 1 tablet (500 mg total) by mouth 2 (two) times daily with a meal. 03/02/21  Yes Mayers, Cari S, PA-C  omeprazole (PRILOSEC) 20 MG capsule Take 1 capsule (20 mg total) by mouth daily. 03/02/21  Yes Mayers, Cari S, PA-C  senna-docusate (SENOKOT-S) 8.6-50 MG tablet TAKE 2 TABLETS BY MOUTH AT BEDTIME. 10/31/20 10/31/21 Yes FGildardo Pounds NP  tiZANidine (ZANAFLEX) 4 MG tablet TAKE 1 TABLET (4 MG  TOTAL) BY MOUTH EVERY 8 (EIGHT) HOURS AS NEEDED. 01/27/21 01/27/22 Yes Tyheim Vanalstyne, Dionne Bucy, PA-C  TRUEplus Lancets 28G MISC USE AS DIRECTED 2 (TWO) TIMES DAILY. 10/29/20 10/29/21 Yes Gildardo Pounds, NP  Zinc 100 MG TABS Take by mouth.   Yes [provider]  metFORMIN (GLUCOPHAGE) 500 MG tablet Take 1 tablet (500 mg total) by mouth 2 (two) times daily with a meal. Pt. Is taking one tablet 2x a day. 04/07/21 04/07/22  Argentina Donovan, PA-C  atorvastatin (LIPITOR) 20 MG tablet Take 1 tablet (20 mg total) by mouth daily. Patient not taking: Reported on 09/27/2020 05/22/20 11/08/20  Gildardo Pounds, NP     Objective:  EXAM:   Vitals:   04/07/21 0854  BP: 103/69  Pulse: 86  SpO2: 99%  Weight: 177 lb 3.2 oz (80.4 kg)  Height: 4' 11"  (1.499 m)    General appearance : A&OX3. NAD. Non-toxic-appearing HEENT: Atraumatic and Normocephalic.  PERRLA. EOM intact.   Chest/Lungs:  Breathing-non-labored, Good air entry bilaterally, breath sounds normal without rales, rhonchi, or wheezing  CVS: S1 S2 regular, no murmurs, gallops, rubs  Extremities: Bilateral Lower Ext shows no edema, both legs are warm to touch with = pulse throughout Neurology:  CN II-XII grossly intact, Non focal.   Psych:  TP linear. J/I WNL. Normal speech. Appropriate eye contact and affect.  Skin:  No Rash  Data Review Lab Results  Component Value Date   HGBA1C 5.6 04/07/2021   HGBA1C 5.3 09/27/2020   HGBA1C 5.2 05/21/2020     Assessment & Plan   1. Type 2 diabetes mellitus with hyperglycemia, without long-term current use of insulin (HCC) Controlled at current dose - Glucose (CBG) - POCT glycosylated hemoglobin (Hb A1C) - Lipid panel - metFORMIN (GLUCOPHAGE) 500 MG tablet; Take 1 tablet (500 mg total) by mouth 2 (two) times daily with a meal. Pt. Is taking one tablet 2x a day.  Dispense: 180 tablet; Refill: 1  2. Essential hypertension Controlled-continue lisinopril 24m.  Please RF for 6 months when due for subsequent RF - Comprehensive metabolic panel - CBC with Differential/Platelet  3. Language barrier [employer] interpreters (Diego)used and additional time performing visit was required.   4. Anemia, unspecified type She is currently taking 1 irone tablet daily - Lipid panel - CBC with Differential/Platelet   Please approve RF for 6 months   Patient have been counseled extensively about nutrition and exercise  Return in about 3 months (around 07/08/2021) for pap with PCP.  The patient was given clear instructions to go to ER or return to medical center if symptoms don't improve, worsen or  new problems develop. The patient verbalized understanding. The patient was told to call to get lab results if they haven't heard anything in the next week.     AFreeman Caldron PA-C [facility] [place]  04/07/2021, 9:21 AM Patient ID: OEddith Mentor Sharp   DOB: 911-23-1972 89y.o.   MRN: [id number]

## 2021-04-08 LAB — CBC WITH DIFFERENTIAL/PLATELET
Basophils Absolute: 0 10*3/uL (ref 0.0–0.2)
Basos: 0 %
EOS (ABSOLUTE): 0.2 10*3/uL (ref 0.0–0.4)
Eos: 3 %
Hematocrit: 34.2 % (ref 34.0–46.6)
Hemoglobin: 11.3 g/dL (ref 11.1–15.9)
Immature Grans (Abs): 0 10*3/uL (ref 0.0–0.1)
Immature Granulocytes: 0 %
Lymphocytes Absolute: 1.6 10*3/uL (ref 0.7–3.1)
Lymphs: 18 %
MCH: 28.9 pg (ref 26.6–33.0)
MCHC: 33 g/dL (ref 31.5–35.7)
MCV: 88 fL (ref 79–97)
Monocytes Absolute: 0.5 10*3/uL (ref 0.1–0.9)
Monocytes: 5 %
Neutrophils Absolute: 6.7 10*3/uL (ref 1.4–7.0)
Neutrophils: 74 %
Platelets: 268 10*3/uL (ref 150–450)
RBC: 3.91 x10E6/uL (ref 3.77–5.28)
RDW: 13.3 % (ref 11.7–15.4)
WBC: 9.1 10*3/uL (ref 3.4–10.8)

## 2021-04-08 LAB — COMPREHENSIVE METABOLIC PANEL
ALT: 12 IU/L (ref 0–32)
AST: 12 IU/L (ref 0–40)
Albumin/Globulin Ratio: 1.4 (ref 1.2–2.2)
Albumin: 4.1 g/dL (ref 3.8–4.8)
Alkaline Phosphatase: 71 IU/L (ref 44–121)
BUN/Creatinine Ratio: 21 (ref 9–23)
BUN: 14 mg/dL (ref 6–24)
Bilirubin Total: 0.3 mg/dL (ref 0.0–1.2)
CO2: 19 mmol/L — ABNORMAL LOW (ref 20–29)
Calcium: 9 mg/dL (ref 8.7–10.2)
Chloride: 103 mmol/L (ref 96–106)
Creatinine, Ser: 0.66 mg/dL (ref 0.57–1.00)
Globulin, Total: 3 g/dL (ref 1.5–4.5)
Glucose: 112 mg/dL — ABNORMAL HIGH (ref 65–99)
Potassium: 4.3 mmol/L (ref 3.5–5.2)
Sodium: 136 mmol/L (ref 134–144)
Total Protein: 7.1 g/dL (ref 6.0–8.5)
eGFR: 108 mL/min/{1.73_m2} (ref >59)

## 2021-04-08 LAB — LIPID PANEL
Chol/HDL Ratio: 5.3 ratio — ABNORMAL HIGH (ref 0.0–4.4)
Cholesterol, Total: 184 mg/dL (ref 100–199)
HDL: 35 mg/dL — ABNORMAL LOW (ref >39)
LDL Chol Calc (NIH): 93 mg/dL (ref 0–99)
Triglycerides: 339 mg/dL — ABNORMAL HIGH (ref 0–149)
VLDL Cholesterol Cal: 56 mg/dL — ABNORMAL HIGH (ref 5–40)

## 2021-04-13 ENCOUNTER — Other Ambulatory Visit: Payer: Self-pay

## 2021-04-13 ENCOUNTER — Other Ambulatory Visit: Payer: Self-pay | Admitting: Physician Assistant

## 2021-04-13 MED ORDER — FISH OIL 1000 MG PO CPDR
3.0000 g | DELAYED_RELEASE_CAPSULE | Freq: Every day | ORAL | 5 refills | Status: DC
  Filled 2021-04-13: qty 90, fill #0

## 2021-04-14 ENCOUNTER — Other Ambulatory Visit: Payer: Self-pay

## 2021-04-19 ENCOUNTER — Other Ambulatory Visit: Payer: Self-pay

## 2021-04-25 ENCOUNTER — Other Ambulatory Visit: Payer: Self-pay | Admitting: *Deleted

## 2021-04-25 DIAGNOSIS — Z1231 Encounter for screening mammogram for malignant neoplasm of breast: Secondary | ICD-10-CM

## 2021-05-04 ENCOUNTER — Other Ambulatory Visit: Payer: Self-pay | Admitting: Pharmacist

## 2021-05-04 ENCOUNTER — Other Ambulatory Visit: Payer: Self-pay

## 2021-05-04 ENCOUNTER — Other Ambulatory Visit: Payer: Self-pay | Admitting: Nurse Practitioner

## 2021-05-04 MED ORDER — FERROUS SULFATE 325 (65 FE) MG PO TABS
ORAL_TABLET | ORAL | 2 refills | Status: DC
  Filled 2021-05-04: qty 60, 30d supply, fill #0
  Filled 2021-07-04: qty 60, 30d supply, fill #1
  Filled 2021-09-12: qty 60, 30d supply, fill #2

## 2021-05-04 NOTE — Telephone Encounter (Signed)
  Notes to clinic:  medication filled by a different provider  Review for refill    Requested Prescriptions  Pending Prescriptions Disp Refills   ferrous sulfate (FEROSUL) 325 (65 FE) MG tablet 180 tablet 0    Sig: TAKE 1 TABLET (325 MG TOTAL) BY MOUTH 2 (TWO) TIMES DAILY.      Endocrinology:  Minerals - Iron Supplementation Passed - 05/04/2021  9:43 AM      Passed - HGB in normal range and within 360 days    Hemoglobin  Date Value Ref Range Status  04/07/2021 11.3 11.1 - 15.9 g/dL Final          Passed - HCT in normal range and within 360 days    Hematocrit  Date Value Ref Range Status  04/07/2021 34.2 34.0 - 46.6 % Final          Passed - RBC in normal range and within 360 days    RBC  Date Value Ref Range Status  04/07/2021 3.91 3.77 - 5.28 x10E6/uL Final  01/17/2021 3.78 (L) 3.87 - 5.11 MIL/uL Final          Passed - Fe (serum) in normal range and within 360 days    Iron  Date Value Ref Range Status  09/27/2020 33 27 - 159 ug/dL Final   Iron Saturation  Date Value Ref Range Status  09/27/2020 9 (LL) 15 - 55 % Final          Passed - Ferritin in normal range and within 360 days    Ferritin  Date Value Ref Range Status  09/27/2020 15 15 - 150 ng/mL Final          Passed - Valid encounter within last 12 months    Recent Outpatient Visits           3 weeks ago Type 2 diabetes mellitus with hyperglycemia, without long-term current use of insulin [facility])   [place] [facility] [place], [place], [place]   3 months ago Anxiety   [place] [address] [facility] [place], [place], [place]   7 months ago Type 2 diabetes mellitus with hyperglycemia, without long-term current use of insulin [facility])   [place] [facility] [place], Shea Stakes, NP   11 months ago Anxiety   [facility] [place], [place] W, NP   1 year ago Type 2 diabetes mellitus with hyperglycemia, without long-term current use of  insulin [facility])   [place] [facility] [place], Shea Stakes, NP       Future Appointments             In 2 months Claiborne Rigg, NP [employer] [facility]

## 2021-05-06 ENCOUNTER — Other Ambulatory Visit: Payer: Self-pay

## 2021-05-09 ENCOUNTER — Other Ambulatory Visit: Payer: Self-pay

## 2021-06-01 ENCOUNTER — Other Ambulatory Visit: Payer: Self-pay | Admitting: Nurse Practitioner

## 2021-06-01 ENCOUNTER — Other Ambulatory Visit: Payer: Self-pay

## 2021-06-01 DIAGNOSIS — L80 Vitiligo: Secondary | ICD-10-CM

## 2021-06-01 MED FILL — Lancets: 50 days supply | Qty: 100 | Fill #1 | Status: AC

## 2021-06-01 NOTE — Telephone Encounter (Signed)
Requested medication (s) are due for refill today: Yes  Requested medication (s) are on the active medication list: Yes  Last refill:  1 year ago  Future visit scheduled: Yes  Notes to clinic:  Unable to refill per protocol, Rx expired.      Requested Prescriptions  Pending Prescriptions Disp Refills   betamethasone dipropionate 0.05 % cream 60 g 1    Sig: APPLY TOPICALLY 2 (TWO) TIMES DAILY.     Off-Protocol Failed - 06/01/2021  3:25 PM      Failed - Medication not assigned to a protocol, review manually.      Passed - Valid encounter within last 12 months    Recent Outpatient Visits           1 month ago Type 2 diabetes mellitus with hyperglycemia, without long-term current use of insulin [facility])   [place] [facility] [place], [place], [place]   4 months ago Anxiety   [place] [address] [facility] [place], [place], [place]   8 months ago Type 2 diabetes mellitus with hyperglycemia, without long-term current use of insulin [facility])   [place] [facility] [place], Shea Stakes, NP   1 year ago Anxiety   [facility] [place], [place] W, NP   1 year ago Type 2 diabetes mellitus with hyperglycemia, without long-term current use of insulin [facility])   [place] [facility] Wellness Claiborne Rigg, NP       Future Appointments             In 1 month Claiborne Rigg, NP [facility] [employer] [facility]

## 2021-06-02 ENCOUNTER — Other Ambulatory Visit: Payer: Self-pay

## 2021-06-02 MED ORDER — BETAMETHASONE DIPROPIONATE 0.05 % EX CREA
TOPICAL_CREAM | Freq: Two times a day (BID) | CUTANEOUS | 1 refills | Status: DC
  Filled 2021-06-02: qty 30, 10d supply, fill #0
  Filled 2021-08-04: qty 15, 10d supply, fill #1

## 2021-06-03 ENCOUNTER — Other Ambulatory Visit: Payer: Self-pay

## 2021-06-09 ENCOUNTER — Ambulatory Visit: Payer: Self-pay | Admitting: *Deleted

## 2021-06-09 ENCOUNTER — Other Ambulatory Visit: Payer: Self-pay

## 2021-06-09 ENCOUNTER — Ambulatory Visit
Admission: RE | Admit: 2021-06-09 | Discharge: 2021-06-09 | Disposition: A | Discharge status: Home / Self Care | Payer: No Typology Code available for payment source | Source: Ambulatory Visit | Attending: Nurse Practitioner | Admitting: Nurse Practitioner

## 2021-06-09 VITALS — BP 128/86 | Wt 177.9 lb

## 2021-06-09 DIAGNOSIS — Z01419 Encounter for gynecological examination (general) (routine) without abnormal findings: Secondary | ICD-10-CM

## 2021-06-09 DIAGNOSIS — Z1211 Encounter for screening for malignant neoplasm of colon: Secondary | ICD-10-CM

## 2021-06-09 DIAGNOSIS — Z1231 Encounter for screening mammogram for malignant neoplasm of breast: Secondary | ICD-10-CM

## 2021-06-09 NOTE — Patient Instructions (Signed)
Explained breast self awareness with Uvaldo Rising. Pap smear completed today. Let her know B[facility]CP will cover Pap smears and HPV typing every 5 years unless has a history of abnormal Pap smears. Referred patient to the [facility] for a screening mammogram on the mobile unit. Appointment scheduled Thursday, June 09, 2021 at 1100. Patient escorted to the mobile unit following BCCCP appointment for her screening mammogram. Let patient know will follow up with her within the next couple weeks with results of Pap smear by phone. Informed patient that the Breast Center will follow-up with her within the next couple of weeks with results of her mammogram by letter or phone. Uvaldo Rising verbalized understanding.  Brad Lieurance, Kathaleen Maser, RN 10:25 AM

## 2021-06-09 NOTE — Progress Notes (Signed)
[name] Rebecca Sharp is a 50 y.o. [phone] female who presents to [facility] clinic today with no complaints.    Pap Smear: Pap smear completed today.Last Pap smear was [facility]29/2018 [facility] and normal per Berniece Salines, RN at the [facility] Department. Per patient has no history of an abnormal Pap smear. No Pap smear results are in Epic.   Physical exam: Breasts Breasts symmetrical. No skin abnormalities bilateral breasts. No nipple retraction bilateral breasts. No nipple discharge bilateral breasts. No lymphadenopathy. No lumps palpated bilateral breasts. No complaints of pain or tenderness on exam.  MS DIGITAL SCREENING BILATERAL  Result Date: 11/22/2016 CLINICAL DATA:  Screening. EXAM: DIGITAL SCREENING BILATERAL MAMMOGRAM WITH CAD COMPARISON:  Previous exam(s). ACR Breast Density Category c: The breast tissue is heterogeneously dense, which may obscure small masses. FINDINGS: There are no findings suspicious for malignancy. Images were processed with CAD. IMPRESSION: No mammographic evidence of malignancy. A result letter of this screening mammogram will be mailed directly to the patient. RECOMMENDATION: Screening mammogram in one year. (Code:SM-B-01Y) BI-RADS CATEGORY  1: Negative. Electronically Signed   By: Ted Mcalpine M.D.   On: 11/22/2016 12:35   MS DIGITAL SCREENING TOMO BILATERAL  Result Date: 05/13/2020 CLINICAL DATA:  Screening. EXAM: DIGITAL SCREENING BILATERAL MAMMOGRAM WITH TOMO AND CAD COMPARISON:  Previous exam(s). ACR Breast Density Category c: The breast tissue is heterogeneously dense, which may obscure small masses. FINDINGS: There are no findings suspicious for malignancy. Images were processed with CAD. IMPRESSION: No mammographic evidence of malignancy. A result letter of this screening mammogram will be mailed directly to the patient. RECOMMENDATION: Screening mammogram in one year. (Code:SM-B-01Y) BI-RADS CATEGORY  1: Negative.  Electronically Signed   By: Hulan Saas M.D.   On: 05/13/2020 12:46   MS DIGITAL SCREENING TOMO BILATERAL  Result Date: 05/07/2019 CLINICAL DATA:  Screening. EXAM: DIGITAL SCREENING BILATERAL MAMMOGRAM WITH TOMO AND CAD COMPARISON:  Previous exam(s). ACR Breast Density Category c: The breast tissue is heterogeneously dense, which may obscure small masses. FINDINGS: There are no findings suspicious for malignancy. Images were processed with CAD. IMPRESSION: No mammographic evidence of malignancy. A result letter of this screening mammogram will be mailed directly to the patient. RECOMMENDATION: Screening mammogram in one year. (Code:SM-B-01Y) BI-RADS CATEGORY  1: Negative. Electronically Signed   By: Amie Portland M.D.   On: 05/07/2019 09:06   MS DIGITAL SCREENING TOMO BILATERAL  Result Date: 02/19/2018 CLINICAL DATA:  Screening. EXAM: DIGITAL SCREENING BILATERAL MAMMOGRAM WITH TOMO AND CAD COMPARISON:  Previous exam(s). ACR Breast Density Category c: The breast tissue is heterogeneously dense, which may obscure small masses. FINDINGS: There are no findings suspicious for malignancy. Images were processed with CAD. IMPRESSION: No mammographic evidence of malignancy. A result letter of this screening mammogram will be mailed directly to the patient. RECOMMENDATION: Screening mammogram in one year. (Code:SM-B-01Y) BI-RADS CATEGORY  1: Negative. Electronically Signed   By: Norva Pavlov M.D.   On: 02/19/2018 15:42         Pelvic/Bimanual Ext Genitalia No lesions, no swelling and no discharge observed on external genitalia.        Vagina Vagina pink and normal texture. No lesions or discharge observed in vagina.        Cervix Cervix is present. Cervix pink and of normal texture. Penpoint sized cyst was observed on cervix around 12 o'clock. No discharge observed.    Uterus Uterus is present and palpable. Uterus in normal position and  normal size.        Adnexae Bilateral ovaries present and  palpable. No tenderness on palpation.         Rectovaginal No rectal exam completed today since patient had no rectal complaints. No skin abnormalities observed on exam.     Smoking History: Patient has never smoked.   Patient Navigation: Patient education provided. Access to services provided for patient through [facility] [place] program. Spanish interpreter Natale Lay from [facility] provided.   Colorectal Cancer Screening: Per patient has never had colonoscopy completed. FIT test given to patient to complete. No complaints today.    Breast and Cervical Cancer Risk Assessment: Patient does not have family history of breast cancer, known genetic mutations, or radiation treatment to the chest before age 74. Patient does not have history of cervical dysplasia, immunocompromised, or DES exposure in-utero.  Risk Assessment     Risk Scores       06/09/2021 11/21/2018   Last edited by: Meryl Dare, CMA Myka Lukins, Carlye Grippe, RN   5-year risk: 0.6 % 0.5 %   Lifetime risk: 5.8 % 6 %           A: BCCCP exam with pap smear No complaints.  P: Referred patient to the [facility] for a screening mammogram on the mobile unit. Appointment scheduled Thursday, June 09, 2021 at 1100.  Priscille Heidelberg, RN 06/09/2021 10:25 AM

## 2021-06-13 LAB — CYTOLOGY - PAP
Comment: NEGATIVE
Diagnosis: NEGATIVE
High risk HPV: NEGATIVE

## 2021-06-14 ENCOUNTER — Telehealth: Payer: Self-pay

## 2021-06-14 NOTE — Telephone Encounter (Signed)
Called patient via Spanish interpreter Natale Lay, [facility] to give pap results. Informed patient that pap was normal and HPV negative. Next pap due in 5 years. Patient voiced understanding.

## 2021-06-26 LAB — FECAL OCCULT BLOOD, IMMUNOCHEMICAL: Fecal Occult Bld: NEGATIVE

## 2021-06-28 ENCOUNTER — Telehealth: Payer: Self-pay

## 2021-06-28 NOTE — Telephone Encounter (Signed)
Called patient via Rebecca Sharp, [employer] to give FIT test results. Informed patient that FIT test was Normal. Patient voiced understanding. 

## 2021-07-04 ENCOUNTER — Other Ambulatory Visit: Payer: Self-pay

## 2021-07-05 ENCOUNTER — Other Ambulatory Visit: Payer: Self-pay

## 2021-07-05 ENCOUNTER — Ambulatory Visit: Payer: Self-pay | Attending: Nurse Practitioner | Admitting: Nurse Practitioner

## 2021-07-05 VITALS — BP 121/78 | HR 82 | Ht 59.0 in | Wt 176.5 lb

## 2021-07-05 DIAGNOSIS — D649 Anemia, unspecified: Secondary | ICD-10-CM

## 2021-07-05 DIAGNOSIS — E781 Pure hyperglyceridemia: Secondary | ICD-10-CM

## 2021-07-05 DIAGNOSIS — I10 Essential (primary) hypertension: Secondary | ICD-10-CM

## 2021-07-05 DIAGNOSIS — Z23 Encounter for immunization: Secondary | ICD-10-CM

## 2021-07-05 NOTE — Progress Notes (Signed)
Assessment & Plan:  Rebecca Sharp was seen today for hypertension.  Diagnoses and all orders for this visit:  Essential hypertension  Anemia, unspecified type -     CBC; Future -     Iron, TIBC and Ferritin Panel; Future  Hypertriglyceridemia -     Lipid panel; Future  Need for immunization against influenza -     Flu Vaccine QUAD 19moIM (Fluarix, Fluzone & Alfiuria Quad PF)   Patient has been counseled on age-appropriate routine health concerns for screening and prevention. These are reviewed and up-to-date. Referrals have been placed accordingly. Immunizations are up-to-date or declined.    Subjective:   Chief Complaint  Patient presents with   Hypertension   HPI OCaitlynne Harbeck89y.o. female presents to office today for HTN She has a past medical history of AMA (advanced maternal age) multigravida 35+, Anemia, Diabetes mellitus without complication ([place], Ganglion cyst (01/25/2017), Kidney stones (2013), Language barrier, Obese, Obesity (01/25/2017), and Pregnancy induced hypertension.   She has concerns today regarding her kidney and liver function.  Endorses increased anxiety over her health due to her father currently being on dialysis and having a diagnosis of poorly controlled diabetes.  We discussed her recent lab work in detail and she verbalized understanding.  HTN Blood pressures well controlled with lisinopril 10 mg daily.  She does not monitor her blood pressure at home as she does not have a blood pressure device. Denies chest pain, shortness of breath, palpitations, lightheadedness, dizziness, headaches or BLE edema.   BP Readings from Last 3 Encounters:  07/05/21 121/78  06/09/21 128/86  04/07/21 103/69    Dyslipidemia The 10-year ASCVD risk score (Arnett DK, et al., 2019) is: 4.2%   Values used to calculate the score:     Age: [age]years     Sex: Female     Is Non-Hispanic African American: No     Diabetic: Yes     Tobacco smoker: No     Systolic  Blood Pressure: 121 mmHg     Is BP treated: Yes     HDL Cholesterol: 35 mg/dL     Total Cholesterol: 184 mg/dL  She is taking omega 3 over the counter since June.  Liver enzymes are normal.  She had been taken off statin in the past due to increasing LFTs.  Review of Systems  Constitutional:  Negative for fever, malaise/fatigue and weight loss.  HENT: Negative.  Negative for nosebleeds.   Eyes: Negative.  Negative for blurred vision, double vision and photophobia.  Respiratory: Negative.  Negative for cough and shortness of breath.   Cardiovascular: Negative.  Negative for chest pain, palpitations and leg swelling.  Gastrointestinal: Negative.  Negative for heartburn, nausea and vomiting.  Musculoskeletal: Negative.  Negative for myalgias.  Neurological: Negative.  Negative for dizziness, focal weakness, seizures and headaches.  Psychiatric/Behavioral: Negative.  Negative for suicidal ideas.    Past Medical History:  Diagnosis Date   AMA (advanced maternal age) multigravida 35+    Anemia    Diabetes mellitus without complication ([place]    Ganglion cyst 01/25/2017   Left ankle   Kidney stones 2013   With pregnancy    Language barrier    Obese    Obesity 01/25/2017   Pregnancy induced hypertension    at end of last 2 pregnancies    Past Surgical History:  Procedure Laterality Date   NO PAST SURGERIES      Family History  Problem Relation Age of Onset  Diabetes Father    Hypertension Father     Social History Reviewed with no changes to be made today.   Outpatient Medications Prior to Visit  Medication Sig Dispense Refill   betamethasone dipropionate 0.05 % cream APPLY TOPICALLY 2 (TWO) TIMES DAILY. 60 g 1   Blood Glucose Monitoring Suppl (TRUE METRIX METER) w/Device KIT 1 each by Does not apply route 2 (two) times daily. 1 kit 0   clotrimazole (CLOTRIMAZOLE ANTI-FUNGAL) 1 % cream Apply 1 application topically 2 (two) times daily. 30 g 0   escitalopram (LEXAPRO) 10 MG  tablet Take 1 tablet (10 mg total) by mouth daily. 90 tablet 1   ferrous sulfate 325 (65 FE) MG tablet TAKE 1 TABLET (325 MG TOTAL) BY MOUTH 2 (TWO) TIMES DAILY. 60 tablet 2   glucose blood (TRUE METRIX BLOOD GLUCOSE TEST) test strip Use as instructed 100 each 12   hydrOXYzine (ATARAX/VISTARIL) 25 MG tablet TAKE 1/2-1 TABLET BY MOUTH EVERY 8 (EIGHT) HOURS AS NEEDED FOR ANXIETY. 60 tablet 2   lisinopril (ZESTRIL) 10 MG tablet TAKE 1 TABLET (10 MG TOTAL) BY MOUTH DAILY. 90 tablet 3   loperamide (IMODIUM) 2 MG capsule Take 1 capsule (2 mg total) by mouth 4 (four) times daily as needed for diarrhea or loose stools. 12 capsule 0   metFORMIN (GLUCOPHAGE) 500 MG tablet Take 1 tablet (500 mg total) by mouth 2 (two) times daily with a meal. 180 tablet 1   naproxen (NAPROSYN) 500 MG tablet Take 1 tablet (500 mg total) by mouth 2 (two) times daily with a meal. 30 tablet 0   Omega-3 Fatty Acids (FISH OIL) 1000 MG CPDR Take 3 g by mouth daily. 90 capsule 5   omeprazole (PRILOSEC) 20 MG capsule Take 1 capsule (20 mg total) by mouth daily. 30 capsule 3   senna-docusate (SENOKOT-S) 8.6-50 MG tablet TAKE 2 TABLETS BY MOUTH AT BEDTIME. 60 tablet 3   tiZANidine (ZANAFLEX) 4 MG tablet TAKE 1 TABLET (4 MG TOTAL) BY MOUTH EVERY 8 (EIGHT) HOURS AS NEEDED. 30 tablet 0   TRUEplus Lancets 28G MISC USE AS DIRECTED 2 (TWO) TIMES DAILY. 100 each 5   Zinc 100 MG TABS Take by mouth.     No facility-administered medications prior to visit.    No Known Allergies     Objective:    BP 121/78   Pulse 82   Ht 4' 11"  (1.499 m)   Wt 176 lb 8 oz (80.1 kg)   SpO2 99%   BMI 35.65 kg/m  Wt Readings from Last 3 Encounters:  07/05/21 176 lb 8 oz (80.1 kg)  06/09/21 177 lb 14.4 oz (80.7 kg)  04/07/21 177 lb 3.2 oz (80.4 kg)    Physical Exam Vitals and nursing note reviewed.  Constitutional:      Appearance: She is well-developed.  HENT:     Head: Normocephalic and atraumatic.  Cardiovascular:     Rate and Rhythm:  Normal rate and regular rhythm.     Heart sounds: Normal heart sounds. No murmur heard.   No friction rub. No gallop.  Pulmonary:     Effort: Pulmonary effort is normal. No tachypnea or respiratory distress.     Breath sounds: Normal breath sounds. No decreased breath sounds, wheezing, rhonchi or rales.  Chest:     Chest wall: No tenderness.  Abdominal:     General: Bowel sounds are normal.     Palpations: Abdomen is soft.  Musculoskeletal:  General: Normal range of motion.     Cervical back: Normal range of motion.  Skin:    General: Skin is warm and dry.  Neurological:     Mental Status: She is alert and oriented to person, place, and time.     Coordination: Coordination normal.  Psychiatric:        Behavior: Behavior normal. Behavior is cooperative.        Thought Content: Thought content normal.        Judgment: Judgment normal.         Patient has been counseled extensively about nutrition and exercise as well as the importance of adherence with medications and regular follow-up. The patient was given clear instructions to go to ER or return to medical center if symptoms don't improve, worsen or new problems develop. The patient verbalized understanding.   Follow-up: Return in about 3 months (around 10/04/2021).   Gildardo Pounds, FNP-BC [facility] [place], [place]   07/06/2021, 8:12 AM

## 2021-07-06 ENCOUNTER — Encounter: Payer: Self-pay | Admitting: Nurse Practitioner

## 2021-07-07 ENCOUNTER — Other Ambulatory Visit: Payer: Self-pay

## 2021-07-08 ENCOUNTER — Other Ambulatory Visit: Payer: Self-pay

## 2021-07-08 ENCOUNTER — Ambulatory Visit: Payer: Self-pay | Attending: Nurse Practitioner

## 2021-07-26 ENCOUNTER — Telehealth: Payer: Self-pay | Admitting: Nurse Practitioner

## 2021-07-26 NOTE — Telephone Encounter (Signed)
Pt was sent a letter from financial dept. Inform them, that the application they submitted was incomplete, since they were missing some documentation at the time of the appointment, Pt need to reschedule and resubmit all new papers and application for CAFA and OC, P.S. old documents has been sent back by mail to the Pt and Pt. need to make a new appt. 

## 2021-08-04 ENCOUNTER — Other Ambulatory Visit: Payer: Self-pay

## 2021-08-05 ENCOUNTER — Other Ambulatory Visit: Payer: Self-pay

## 2021-09-02 ENCOUNTER — Other Ambulatory Visit: Payer: Self-pay

## 2021-09-02 MED FILL — Lancets: 50 days supply | Qty: 100 | Fill #2 | Status: AC

## 2021-09-12 ENCOUNTER — Other Ambulatory Visit: Payer: Self-pay

## 2021-09-14 ENCOUNTER — Other Ambulatory Visit: Payer: Self-pay

## 2021-09-23 ENCOUNTER — Ambulatory Visit: Payer: Self-pay | Attending: Nurse Practitioner | Admitting: Nurse Practitioner

## 2021-09-23 ENCOUNTER — Other Ambulatory Visit: Payer: Self-pay

## 2021-09-23 ENCOUNTER — Encounter: Payer: Self-pay | Admitting: Nurse Practitioner

## 2021-09-23 VITALS — BP 117/75 | HR 76 | Ht 59.0 in | Wt 177.4 lb

## 2021-09-23 DIAGNOSIS — N898 Other specified noninflammatory disorders of vagina: Secondary | ICD-10-CM

## 2021-09-23 DIAGNOSIS — F419 Anxiety disorder, unspecified: Secondary | ICD-10-CM

## 2021-09-23 DIAGNOSIS — L80 Vitiligo: Secondary | ICD-10-CM

## 2021-09-23 DIAGNOSIS — N939 Abnormal uterine and vaginal bleeding, unspecified: Secondary | ICD-10-CM

## 2021-09-23 DIAGNOSIS — E785 Hyperlipidemia, unspecified: Secondary | ICD-10-CM

## 2021-09-23 DIAGNOSIS — I10 Essential (primary) hypertension: Secondary | ICD-10-CM

## 2021-09-23 DIAGNOSIS — E1165 Type 2 diabetes mellitus with hyperglycemia: Secondary | ICD-10-CM

## 2021-09-23 DIAGNOSIS — D649 Anemia, unspecified: Secondary | ICD-10-CM

## 2021-09-23 LAB — POCT URINE PREGNANCY: Preg Test, Ur: NEGATIVE

## 2021-09-23 LAB — POCT URINALYSIS DIP (CLINITEK)
Bilirubin, UA: NEGATIVE
Blood, UA: NEGATIVE
Glucose, UA: NEGATIVE mg/dL
Ketones, POC UA: NEGATIVE mg/dL
Nitrite, UA: NEGATIVE
POC PROTEIN,UA: NEGATIVE
Spec Grav, UA: >=1.03 — AB (ref 1.010–1.025)
Urobilinogen, UA: 0.2 E.U./dL
pH, UA: 6 (ref 5.0–8.0)

## 2021-09-23 MED ORDER — BETAMETHASONE DIPROPIONATE 0.05 % EX CREA
TOPICAL_CREAM | Freq: Two times a day (BID) | CUTANEOUS | 1 refills | Status: DC
  Filled 2021-09-23: qty 60, 30d supply, fill #0

## 2021-09-23 MED ORDER — NYSTATIN 100000 UNIT/GM EX CREA
1.0000 "application " | TOPICAL_CREAM | Freq: Two times a day (BID) | CUTANEOUS | 0 refills | Status: DC
  Filled 2021-09-23: qty 60, 30d supply, fill #0

## 2021-09-23 MED ORDER — TRUE METRIX BLOOD GLUCOSE TEST VI STRP
ORAL_STRIP | 12 refills | Status: DC
  Filled 2021-09-23 – 2021-12-27 (×2): qty 100, 25d supply, fill #0
  Filled 2022-05-02: qty 100, 25d supply, fill #1
  Filled 2022-07-24 – 2022-07-31 (×2): qty 100, 25d supply, fill #2

## 2021-09-23 MED ORDER — ESCITALOPRAM OXALATE 10 MG PO TABS
10.0000 mg | ORAL_TABLET | Freq: Every day | ORAL | 1 refills | Status: DC
  Filled 2021-09-23: qty 30, 30d supply, fill #0

## 2021-09-23 MED ORDER — METFORMIN HCL 500 MG PO TABS
500.0000 mg | ORAL_TABLET | Freq: Two times a day (BID) | ORAL | 1 refills | Status: DC
  Filled 2021-09-23: qty 60, 30d supply, fill #0

## 2021-09-23 NOTE — Progress Notes (Signed)
Assessment & Plan:  Rebecca Sharp was seen today for hypertension.  Diagnoses and all orders for this visit:  Primary hypertension -     CMP14+EGFR Continue lisinopril as prescribed.  Remember to bring in your blood pressure log with you for your follow up appointment.  DASH/Mediterranean Diets are healthier choices for HTN.    Type 2 diabetes mellitus with hyperglycemia, without long-term current use of insulin (HCC) -     glucose blood (TRUE METRIX BLOOD GLUCOSE TEST) test strip; Use as instructed -     metFORMIN (GLUCOPHAGE) 500 MG tablet; Take 1 tablet (500 mg total) by mouth 2 (two) times daily with a meal. -     CMP14+EGFR -     Hemoglobin A1c  Abnormal uterine bleeding (AUB) -     POCT URINALYSIS DIP (CLINITEK) -     POCT urine pregnancy  Anxiety -     escitalopram (LEXAPRO) 10 MG tablet; Take 1 tablet (10 mg total) by mouth daily. Well-controlled  Anemia, unspecified type -     CBC -     Iron, TIBC and Ferritin Panel  Vitiligo -     betamethasone dipropionate 0.05 % cream; APPLY TOPICALLY 2 (TWO) TIMES DAILY.  Itching in the vaginal area -     nystatin cream (MYCOSTATIN); Apply 1 application topically 2 (two) times daily. VAGINALLY Denies any current symptoms but states she would like a prescription for any future reoccurring symptoms  Dyslipidemia, goal LDL below 70 -     Lipid panel   Patient has been counseled on age-appropriate routine health concerns for screening and prevention. These are reviewed and up-to-date. Referrals have been placed accordingly. Immunizations are up-to-date or declined.    Subjective:   Chief Complaint  Patient presents with   Hypertension   HPI Rebecca Sharp 50 y.o. female presents to office today for follow-up to hypertension  VRI was used to communicate directly with patient for the entire encounter including providing detailed patient instructions.    Concerned she may have a kidney stone. She states she was told she  had a kidney stone when she was pregnant 9 years ago.  She currently denies any symptoms of UTI: flank pain, hematuria, dysuria.  HTN Blood pressure is well controlled lisinopril 10 mg daily. She did not take it today as she is fasting for her blood work and assumed she was not supposed to take her blood pressure medication.  BP Readings from Last 3 Encounters:  09/23/21 117/75  07/05/21 121/78  06/09/21 128/86     DM 2 Well-controlled with metformin 500 mg twice daily.  Lipids not at goal Lab Results  Component Value Date   HGBA1C 5.6 04/07/2021    Lab Results  Component Value Date   LDLCALC 93 04/07/2021  The 10-year ASCVD risk score (Arnett DK, et al., 2019) is: 3.9%   Values used to calculate the score:     Age: 27 years     Sex: Female     Is Non-Hispanic African American: No     Diabetic: Yes     Tobacco smoker: No     Systolic Blood Pressure: 446 mmHg     Is BP treated: Yes     HDL Cholesterol: 35 mg/dL     Total Cholesterol: 184 mg/dL  AUB Likely perimenopausal.  She endorses amenorrhea with last menstrual cycle in October    Review of Systems  Constitutional:  Negative for fever, malaise/fatigue and weight loss.  HENT: Negative.  Negative for nosebleeds.   Eyes: Negative.  Negative for blurred vision, double vision and photophobia.  Respiratory: Negative.  Negative for cough and shortness of breath.   Cardiovascular: Negative.  Negative for chest pain, palpitations and leg swelling.  Gastrointestinal: Negative.  Negative for heartburn, nausea and vomiting.  Musculoskeletal: Negative.  Negative for myalgias.  Neurological: Negative.  Negative for dizziness, focal weakness, seizures and headaches.  Psychiatric/Behavioral: Negative.  Negative for suicidal ideas.    Past Medical History:  Diagnosis Date   AMA (advanced maternal age) multigravida 35+    Anemia    Diabetes mellitus without complication ([place])    Ganglion cyst 01/25/2017   Left ankle   Kidney  stones 2013   With pregnancy    Language barrier    Obese    Obesity 01/25/2017   Pregnancy induced hypertension    at end of last 2 pregnancies    Past Surgical History:  Procedure Laterality Date   NO PAST SURGERIES      Family History  Problem Relation Age of Onset   Diabetes Father    Hypertension Father     Social History Reviewed with no changes to be made today.   Outpatient Medications Prior to Visit  Medication Sig Dispense Refill   Blood Glucose Monitoring Suppl (TRUE METRIX METER) w/Device KIT 1 each by Does not apply route 2 (two) times daily. 1 kit 0   lisinopril (ZESTRIL) 10 MG tablet TAKE 1 TABLET (10 MG TOTAL) BY MOUTH DAILY. 90 tablet 3   Omega-3 Fatty Acids (FISH OIL) 1000 MG CPDR Take 3 g by mouth daily. 90 capsule 5   TRUEplus Lancets 28G MISC USE AS DIRECTED 2 (TWO) TIMES DAILY. 100 each 5   betamethasone dipropionate 0.05 % cream APPLY TOPICALLY 2 (TWO) TIMES DAILY. 60 g 1   glucose blood (TRUE METRIX BLOOD GLUCOSE TEST) test strip Use as instructed 100 each 12   metFORMIN (GLUCOPHAGE) 500 MG tablet Take 1 tablet (500 mg total) by mouth 2 (two) times daily with a meal. 180 tablet 1   ferrous sulfate 325 (65 FE) MG tablet TAKE 1 TABLET (325 MG TOTAL) BY MOUTH 2 (TWO) TIMES DAILY. (Patient not taking: Reported on 09/23/2021) 60 tablet 2   clotrimazole (CLOTRIMAZOLE ANTI-FUNGAL) 1 % cream Apply 1 application topically 2 (two) times daily. (Patient not taking: Reported on 09/23/2021) 30 g 0   escitalopram (LEXAPRO) 10 MG tablet Take 1 tablet (10 mg total) by mouth daily. (Patient not taking: Reported on 09/23/2021) 90 tablet 1   hydrOXYzine (ATARAX/VISTARIL) 25 MG tablet TAKE 1/2-1 TABLET BY MOUTH EVERY 8 (EIGHT) HOURS AS NEEDED FOR ANXIETY. (Patient not taking: Reported on 09/23/2021) 60 tablet 2   loperamide (IMODIUM) 2 MG capsule Take 1 capsule (2 mg total) by mouth 4 (four) times daily as needed for diarrhea or loose stools. (Patient not taking: Reported on  09/23/2021) 12 capsule 0   naproxen (NAPROSYN) 500 MG tablet Take 1 tablet (500 mg total) by mouth 2 (two) times daily with a meal. (Patient not taking: Reported on 09/23/2021) 30 tablet 0   omeprazole (PRILOSEC) 20 MG capsule Take 1 capsule (20 mg total) by mouth daily. (Patient not taking: Reported on 09/23/2021) 30 capsule 3   senna-docusate (SENOKOT-S) 8.6-50 MG tablet TAKE 2 TABLETS BY MOUTH AT BEDTIME. (Patient not taking: Reported on 09/23/2021) 60 tablet 3   tiZANidine (ZANAFLEX) 4 MG tablet TAKE 1 TABLET (4 MG TOTAL) BY MOUTH EVERY 8 (EIGHT) HOURS AS NEEDED. (Patient  not taking: Reported on 09/23/2021) 30 tablet 0   Zinc 100 MG TABS Take by mouth. (Patient not taking: Reported on 09/23/2021)     No facility-administered medications prior to visit.    No Known Allergies     Objective:    BP 117/75   Pulse 76   Ht 4' 11"  (1.499 m)   Wt 177 lb 6 oz (80.5 kg)   LMP 08/09/2021   SpO2 99%   BMI 35.83 kg/m  Wt Readings from Last 3 Encounters:  09/23/21 177 lb 6 oz (80.5 kg)  07/05/21 176 lb 8 oz (80.1 kg)  06/09/21 177 lb 14.4 oz (80.7 kg)    Physical Exam       Patient has been counseled extensively about nutrition and exercise as well as the importance of adherence with medications and regular follow-up. The patient was given clear instructions to go to ER or return to medical center if symptoms don't improve, worsen or new problems develop. The patient verbalized understanding.   Follow-up: Return in about 3 months (around 12/22/2021).   Gildardo Pounds, FNP-BC [facility] and [place], [place]   09/23/2021, 12:48 PM

## 2021-09-24 LAB — CMP14+EGFR
ALT: 12 IU/L (ref 0–32)
AST: 10 IU/L (ref 0–40)
Albumin/Globulin Ratio: 1.5 (ref 1.2–2.2)
Albumin: 4.5 g/dL (ref 3.8–4.8)
Alkaline Phosphatase: 62 IU/L (ref 44–121)
BUN/Creatinine Ratio: 18 (ref 9–23)
BUN: 14 mg/dL (ref 6–24)
Bilirubin Total: 0.4 mg/dL (ref 0.0–1.2)
CO2: 19 mmol/L — ABNORMAL LOW (ref 20–29)
Calcium: 9.1 mg/dL (ref 8.7–10.2)
Chloride: 106 mmol/L (ref 96–106)
Creatinine, Ser: 0.77 mg/dL (ref 0.57–1.00)
Globulin, Total: 3 g/dL (ref 1.5–4.5)
Glucose: 92 mg/dL (ref 70–99)
Potassium: 4.6 mmol/L (ref 3.5–5.2)
Sodium: 140 mmol/L (ref 134–144)
Total Protein: 7.5 g/dL (ref 6.0–8.5)
eGFR: 95 mL/min/{1.73_m2} (ref >59)

## 2021-09-24 LAB — CBC
Hematocrit: 35 % (ref 34.0–46.6)
Hemoglobin: 11.5 g/dL (ref 11.1–15.9)
MCH: 28.2 pg (ref 26.6–33.0)
MCHC: 32.9 g/dL (ref 31.5–35.7)
MCV: 86 fL (ref 79–97)
Platelets: 271 10*3/uL (ref 150–450)
RBC: 4.08 x10E6/uL (ref 3.77–5.28)
RDW: 13.4 % (ref 11.7–15.4)
WBC: 7.4 10*3/uL (ref 3.4–10.8)

## 2021-09-24 LAB — IRON,TIBC AND FERRITIN PANEL
Ferritin: 44 ng/mL (ref 15–150)
Iron Saturation: 22 % (ref 15–55)
Iron: 75 ug/dL (ref 27–159)
Total Iron Binding Capacity: 337 ug/dL (ref 250–450)
UIBC: 262 ug/dL (ref 131–425)

## 2021-09-24 LAB — LIPID PANEL
Chol/HDL Ratio: 4.4 ratio (ref 0.0–4.4)
Cholesterol, Total: 160 mg/dL (ref 100–199)
HDL: 36 mg/dL — ABNORMAL LOW (ref >39)
LDL Chol Calc (NIH): 102 mg/dL — ABNORMAL HIGH (ref 0–99)
Triglycerides: 124 mg/dL (ref 0–149)
VLDL Cholesterol Cal: 22 mg/dL (ref 5–40)

## 2021-09-24 LAB — HEMOGLOBIN A1C
Est. average glucose Bld gHb Est-mCnc: 120 mg/dL
Hgb A1c MFr Bld: 5.8 % — ABNORMAL HIGH (ref 4.8–5.6)

## 2021-09-27 ENCOUNTER — Other Ambulatory Visit: Payer: Self-pay

## 2021-10-05 ENCOUNTER — Other Ambulatory Visit: Payer: Self-pay

## 2021-10-06 ENCOUNTER — Other Ambulatory Visit: Payer: Self-pay

## 2021-10-20 ENCOUNTER — Telehealth: Payer: Self-pay

## 2021-10-20 ENCOUNTER — Telehealth: Payer: Self-pay | Admitting: Nurse Practitioner

## 2021-10-20 NOTE — Telephone Encounter (Signed)
Called patient reviewed all information and repeated back to me. Will call if any questions. With the help of translator Angelica # [phone]

## 2021-10-20 NOTE — Telephone Encounter (Signed)
Called patient reviewed all information and repeated back to me. Will call if any questions.  With the help of translator Angelica [phone]

## 2021-10-20 NOTE — Telephone Encounter (Signed)
Copied from CRM [id number]. Topic: General - Other >> Oct 20, 2021  4:26 PM Rebecca Sharp wrote: Reason for CRM: Pt stated she has not heard from anyone regarding her lab results that was done last month. Pt requests that the call back be done in the afternoon because she works morning shift. Cb#  [phone]

## 2021-11-15 ENCOUNTER — Other Ambulatory Visit: Payer: Self-pay

## 2021-11-15 ENCOUNTER — Other Ambulatory Visit: Payer: Self-pay | Admitting: Nurse Practitioner

## 2021-11-15 MED ORDER — FERROUS SULFATE 325 (65 FE) MG PO TABS
ORAL_TABLET | ORAL | 2 refills | Status: DC
  Filled 2021-11-15: qty 60, 30d supply, fill #0

## 2021-11-15 NOTE — Telephone Encounter (Signed)
Requested Prescriptions  Pending Prescriptions Disp Refills   ferrous sulfate (FEROSUL) 325 (65 FE) MG tablet 60 tablet 2    Sig: TAKE 1 TABLET (325 MG TOTAL) BY MOUTH 2 (TWO) TIMES DAILY.     Endocrinology:  Minerals - Iron Supplementation Passed - 11/15/2021  9:42 AM      Passed - HGB in normal range and within 360 days    Hemoglobin  Date Value Ref Range Status  09/23/2021 11.5 11.1 - 15.9 g/dL Final         Passed - HCT in normal range and within 360 days    Hematocrit  Date Value Ref Range Status  09/23/2021 35.0 34.0 - 46.6 % Final         Passed - RBC in normal range and within 360 days    RBC  Date Value Ref Range Status  09/23/2021 4.08 3.77 - 5.28 x10E6/uL Final  01/17/2021 3.78 (L) 3.87 - 5.11 MIL/uL Final         Passed - Fe (serum) in normal range and within 360 days    Iron  Date Value Ref Range Status  09/23/2021 75 27 - 159 ug/dL Final   Iron Saturation  Date Value Ref Range Status  09/23/2021 22 15 - 55 % Final         Passed - Ferritin in normal range and within 360 days    Ferritin  Date Value Ref Range Status  09/23/2021 44 15 - 150 ng/mL Final         Passed - Valid encounter within last 12 months    Recent Outpatient Visits          1 month ago Primary hypertension   [place], Vernia Buff, NP   4 months ago Essential hypertension   [place], [place] W, NP   7 months ago Type 2 diabetes mellitus with hyperglycemia, without long-term current use of insulin [facility])   [place], [place], [place]   9 months ago [place], [place], [place]   1 year ago Type 2 diabetes mellitus with hyperglycemia, without long-term current use of insulin [facility])   [place], Vernia Buff, NP      Future Appointments            In 1 month Gildardo Pounds, NP [place]

## 2021-11-18 ENCOUNTER — Other Ambulatory Visit: Payer: Self-pay

## 2021-12-23 ENCOUNTER — Ambulatory Visit: Payer: No Typology Code available for payment source | Admitting: Nurse Practitioner

## 2021-12-27 ENCOUNTER — Other Ambulatory Visit: Payer: Self-pay | Admitting: Nurse Practitioner

## 2021-12-27 ENCOUNTER — Other Ambulatory Visit: Payer: Self-pay

## 2021-12-27 DIAGNOSIS — E1165 Type 2 diabetes mellitus with hyperglycemia: Secondary | ICD-10-CM

## 2021-12-28 ENCOUNTER — Other Ambulatory Visit: Payer: Self-pay

## 2021-12-28 MED ORDER — TRUEPLUS LANCETS 28G MISC
0 refills | Status: DC
  Filled 2021-12-28: qty 100, 50d supply, fill #0

## 2021-12-28 NOTE — Telephone Encounter (Signed)
Requested medication (s) are due for refill today:   Not sure ? ?Requested medication (s) are on the active medication list:   No  Looks like she got a new kit in Nov. 2022 but has not gotten lancets since then. ? ?Future visit scheduled:   Yes ? ? ?Last ordered: Not on list ? ?Returned because not on list  ? ?Requested Prescriptions  ?Pending Prescriptions Disp Refills  ? TRUEplus Lancets 28G MISC 100 each 5  ?  Sig: USE AS DIRECTED 2 (TWO) TIMES DAILY.  ?  ? Endocrinology: Diabetes - Testing Supplies Passed - 12/27/2021  5:03 PM  ?  ?  Passed - Valid encounter within last 12 months  ?  Recent Outpatient Visits   ? ?      ? 3 months ago Primary hypertension  ? [place], [place] W, NP  ? 5 months ago Essential hypertension  ? [place], [place] W, NP  ? 8 months ago Type 2 diabetes mellitus with hyperglycemia, without long-term current use of insulin ([place])  ? [place], [place], [place]  ? 11 months ago Anxiety  ? [place], [place], [place]  ? 1 year ago Type 2 diabetes mellitus with hyperglycemia, without long-term current use of insulin ([place])  ? [place] Gildardo Pounds, NP  ? ?  ?  ?Future Appointments   ? ?        ? In 3 weeks [place], [place]  ? ?  ? ?  ?  ?  ? ?

## 2021-12-29 ENCOUNTER — Other Ambulatory Visit: Payer: Self-pay

## 2022-01-02 ENCOUNTER — Other Ambulatory Visit: Payer: Self-pay

## 2022-01-18 ENCOUNTER — Encounter: Payer: Self-pay | Admitting: Physician Assistant

## 2022-01-18 ENCOUNTER — Ambulatory Visit: Payer: No Typology Code available for payment source | Attending: Physician Assistant | Admitting: Physician Assistant

## 2022-01-18 ENCOUNTER — Other Ambulatory Visit: Payer: Self-pay

## 2022-01-18 VITALS — BP 114/78 | HR 76 | Resp 16 | Wt 180.0 lb

## 2022-01-18 DIAGNOSIS — F419 Anxiety disorder, unspecified: Secondary | ICD-10-CM

## 2022-01-18 DIAGNOSIS — I10 Essential (primary) hypertension: Secondary | ICD-10-CM

## 2022-01-18 DIAGNOSIS — L80 Vitiligo: Secondary | ICD-10-CM

## 2022-01-18 DIAGNOSIS — R7303 Prediabetes: Secondary | ICD-10-CM

## 2022-01-18 DIAGNOSIS — D649 Anemia, unspecified: Secondary | ICD-10-CM

## 2022-01-18 LAB — GLUCOSE, POCT (MANUAL RESULT ENTRY): POC Glucose: 137 mg/dl — AB (ref 70–99)

## 2022-01-18 MED ORDER — LISINOPRIL 10 MG PO TABS
ORAL_TABLET | Freq: Every day | ORAL | 3 refills | Status: DC
  Filled 2022-01-18: qty 90, 90d supply, fill #0
  Filled 2022-05-02: qty 90, 90d supply, fill #1

## 2022-01-18 MED ORDER — METFORMIN HCL 500 MG PO TABS
500.0000 mg | ORAL_TABLET | Freq: Two times a day (BID) | ORAL | 1 refills | Status: DC
  Filled 2022-01-18: qty 180, 90d supply, fill #0

## 2022-01-18 MED ORDER — BETAMETHASONE DIPROPIONATE 0.05 % EX CREA
TOPICAL_CREAM | Freq: Two times a day (BID) | CUTANEOUS | 1 refills | Status: DC
  Filled 2022-01-18: qty 60, 30d supply, fill #0

## 2022-01-18 MED ORDER — FERROUS SULFATE 325 (65 FE) MG PO TABS
325.0000 mg | ORAL_TABLET | Freq: Every day | ORAL | 2 refills | Status: DC
  Filled 2022-01-18: qty 90, 90d supply, fill #0
  Filled 2022-05-02: qty 90, 90d supply, fill #1

## 2022-01-18 MED ORDER — ESCITALOPRAM OXALATE 10 MG PO TABS
10.0000 mg | ORAL_TABLET | Freq: Every day | ORAL | 1 refills | Status: DC
  Filled 2022-01-18: qty 90, 90d supply, fill #0

## 2022-01-18 NOTE — Progress Notes (Signed)
Patient ID: Rebecca Sharp, female   DOB: 07/29/1971, 51 y.o.   MRN: [id number] ? ? ?Rebecca Sharp, is a 51 y.o. female ? ?[id number] ? ?[id number] ? ?DOB - [birth year] ? ?Chief Complaint  ?Patient presents with  ? Diabetes  ?    ? ?Subjective:  ? ?Rebecca Sharp is a 51 y.o. female here today for check up.  Doing well.  No new issues or concerns.  Needs RF.  Blood sugars 150 at highest but usu less.   ? ?No problems updated. ? ?ALLERGIES: ?No Known Allergies ? ?PAST MEDICAL HISTORY: ?Past Medical History:  ?Diagnosis Date  ? AMA (advanced maternal age) multigravida 53+   ? Anemia   ? Diabetes mellitus without complication ([place])   ? Ganglion cyst 01/25/2017  ? Left ankle  ? Kidney stones 2013  ? With pregnancy   ? Language barrier   ? Obese   ? Obesity 01/25/2017  ? Pregnancy induced hypertension   ? at end of last 2 pregnancies  ? ? ?MEDICATIONS AT HOME: ?Prior to Admission medications   ?Medication Sig Start Date End Date Taking? Authorizing Provider  ?betamethasone dipropionate 0.05 % cream APPLY TOPICALLY 2 (TWO) TIMES DAILY. 01/18/22 01/18/23  Argentina Donovan, PA-C  ?Blood Glucose Monitoring Suppl (TRUE METRIX METER) w/Device KIT 1 each by Does not apply route 2 (two) times daily. 08/21/19   Argentina Donovan, PA-C  ?escitalopram (LEXAPRO) 10 MG tablet Take 1 tablet (10 mg total) by mouth daily. 01/18/22   Argentina Donovan, PA-C  ?ferrous sulfate (FEROSUL) 325 (65 FE) MG tablet Take 1 tablet (325 mg total) by mouth daily with breakfast. 01/18/22 01/18/23  Argentina Donovan, PA-C  ?glucose blood (TRUE METRIX BLOOD GLUCOSE TEST) test strip Use as instructed 09/23/21   Gildardo Pounds, NP  ?lisinopril (ZESTRIL) 10 MG tablet TAKE 1 TABLET (10 MG TOTAL) BY MOUTH DAILY. 01/18/22 01/18/23  Argentina Donovan, PA-C  ?metFORMIN (GLUCOPHAGE) 500 MG tablet Take 1 tablet (500 mg total) by mouth 2 (two) times daily with a meal. 01/18/22 01/18/23  Argentina Donovan, PA-C  ?nystatin cream (MYCOSTATIN) Apply 1 application  topically 2 (two) times daily. VAGINALLY 09/23/21   Gildardo Pounds, NP  ?Omega-3 Fatty Acids (FISH OIL) 1000 MG CPDR Take 3 g by mouth daily. 04/13/21   Argentina Donovan, PA-C  ?TRUEplus Lancets 28G MISC USE AS DIRECTED 2 (TWO) TIMES DAILY. 12/28/21 12/28/22  Charlott Rakes, MD  ?atorvastatin (LIPITOR) 20 MG tablet Take 1 tablet (20 mg total) by mouth daily. ?Patient not taking: Reported on 09/27/2020 05/22/20 11/08/20  Gildardo Pounds, NP  ? ? ?ROS: ?Neg HEENT ?Neg resp ?Neg cardiac ?Neg GI ?Neg GU ?Neg MS ?Neg psych ?Neg neuro ? ?Objective:  ? ?Vitals:  ? 01/18/22 1616  ?BP: 114/78  ?Pulse: 76  ?Resp: 16  ?SpO2: 99%  ?Weight: 180 lb (81.6 kg)  ? ?Exam ?General appearance : Awake, alert, not in any distress. Speech Clear. Not toxic looking ?HEENT: Atraumatic and Normocephalic ?Neck: Supple, no JVD. No cervical lymphadenopathy.  ?Chest: Good air entry bilaterally, CTAB.  No rales/rhonchi/wheezing ?CVS: S1 S2 regular, no murmurs.  ?Abdomen: Bowel sounds present, Non tender and not distended with no gaurding, rigidity or rebound. ?Extremities: B/L Lower Ext shows no edema, both legs are warm to touch ?Neurology: Awake alert, and oriented X 3, CN II-XII intact, Non focal ?Skin: No Rash ? ?Data Review ?Lab Results  ?Component Value Date  ? HGBA1C 5.8 (H)  09/23/2021  ? HGBA1C 5.6 04/07/2021  ? HGBA1C 5.3 09/27/2020  ? ? ?Assessment & Plan  ? ?1. Prediabetes ?- Glucose (CBG) ?- metFORMIN (GLUCOPHAGE) 500 MG tablet; Take 1 tablet (500 mg total) by mouth 2 (two) times daily with a meal.  Dispense: 180 tablet; Refill: 1 ?- Hemoglobin A1c ?I have had a lengthy discussion and provided education about insulin resistance and the intake of too much sugar/refined carbohydrates.  I have advised the patient to work at a goal of eliminating sugary drinks, candy, desserts, sweets, refined sugars, processed foods, and white carbohydrates.  The patient expresses understanding.  ? ? ?2. Anxiety ?Stable-has not been taking but may want to  resume ?- escitalopram (LEXAPRO) 10 MG tablet; Take 1 tablet (10 mg total) by mouth daily.  Dispense: 90 tablet; Refill: 1 ? ?3. Essential hypertension ?controlled ?- lisinopril (ZESTRIL) 10 MG tablet; TAKE 1 TABLET (10 MG TOTAL) BY MOUTH DAILY.  Dispense: 90 tablet; Refill: 3 ? ?4. Vitiligo ?- betamethasone dipropionate 0.05 % cream; APPLY TOPICALLY 2 (TWO) TIMES DAILY.  Dispense: 60 g; Refill: 1 ? ?5. Anemia, unspecified type ?- ferrous sulfate (FEROSUL) 325 (65 FE) MG tablet; Take 1 tablet (325 mg total) by mouth daily with breakfast.  Dispense: 90 tablet; Refill: 2 ?- CBC with Differential/Platelet ? ?[employer] interpreters "Minna Merritts" used and additional time performing visit was required. ? ? ?Patient have been counseled extensively about nutrition and exercise. Other issues discussed during this visit include: low cholesterol diet, weight control and daily exercise, foot care, annual eye examinations at Ophthalmology, importance of adherence with medications and regular follow-up. We also discussed long term complications of uncontrolled diabetes and hypertension.  ? ?Return in about 4 months (around 05/20/2022) for PCP for chronic conditions. ? ?The patient was given clear instructions to go to ER or return to medical center if symptoms don't improve, worsen or new problems develop. The patient verbalized understanding. The patient was told to call to get lab results if they haven't heard anything in the next week.  ? ? ? ? ?Freeman Caldron, PA-C ?[place] ?[place], [place] ?[phone]   ?01/18/2022, 4:33 PM  ?

## 2022-01-19 LAB — CBC WITH DIFFERENTIAL/PLATELET
Basophils Absolute: 0 10*3/uL (ref 0.0–0.2)
Basos: 1 %
EOS (ABSOLUTE): 0.3 10*3/uL (ref 0.0–0.4)
Eos: 3 %
Hematocrit: 33.1 % — ABNORMAL LOW (ref 34.0–46.6)
Hemoglobin: 10.7 g/dL — ABNORMAL LOW (ref 11.1–15.9)
Immature Grans (Abs): 0 10*3/uL (ref 0.0–0.1)
Immature Granulocytes: 0 %
Lymphocytes Absolute: 2.1 10*3/uL (ref 0.7–3.1)
Lymphs: 27 %
MCH: 28.4 pg (ref 26.6–33.0)
MCHC: 32.3 g/dL (ref 31.5–35.7)
MCV: 88 fL (ref 79–97)
Monocytes Absolute: 0.4 10*3/uL (ref 0.1–0.9)
Monocytes: 5 %
Neutrophils Absolute: 5 10*3/uL (ref 1.4–7.0)
Neutrophils: 64 %
Platelets: 248 10*3/uL (ref 150–450)
RBC: 3.77 x10E6/uL (ref 3.77–5.28)
RDW: 13.1 % (ref 11.7–15.4)
WBC: 7.8 10*3/uL (ref 3.4–10.8)

## 2022-01-19 LAB — HEMOGLOBIN A1C
Est. average glucose Bld gHb Est-mCnc: 111 mg/dL
Hgb A1c MFr Bld: 5.5 % (ref 4.8–5.6)

## 2022-05-02 ENCOUNTER — Other Ambulatory Visit: Payer: Self-pay | Admitting: Family Medicine

## 2022-05-02 ENCOUNTER — Other Ambulatory Visit: Payer: Self-pay

## 2022-05-02 DIAGNOSIS — E1165 Type 2 diabetes mellitus with hyperglycemia: Secondary | ICD-10-CM

## 2022-05-03 ENCOUNTER — Other Ambulatory Visit: Payer: Self-pay

## 2022-05-03 MED ORDER — TRUEPLUS LANCETS 28G MISC
0 refills | Status: DC
  Filled 2022-05-03: qty 100, 50d supply, fill #0

## 2022-05-15 ENCOUNTER — Other Ambulatory Visit: Payer: Self-pay

## 2022-05-15 DIAGNOSIS — Z1231 Encounter for screening mammogram for malignant neoplasm of breast: Secondary | ICD-10-CM

## 2022-05-22 ENCOUNTER — Ambulatory Visit: Payer: Self-pay | Attending: Nurse Practitioner | Admitting: Nurse Practitioner

## 2022-05-22 ENCOUNTER — Encounter: Payer: Self-pay | Admitting: Nurse Practitioner

## 2022-05-22 VITALS — BP 99/67 | HR 87 | Temp 98.2°F | Ht 59.0 in | Wt 178.0 lb

## 2022-05-22 DIAGNOSIS — Z1159 Encounter for screening for other viral diseases: Secondary | ICD-10-CM

## 2022-05-22 DIAGNOSIS — E1165 Type 2 diabetes mellitus with hyperglycemia: Secondary | ICD-10-CM

## 2022-05-22 DIAGNOSIS — Z23 Encounter for immunization: Secondary | ICD-10-CM

## 2022-05-22 DIAGNOSIS — I10 Essential (primary) hypertension: Secondary | ICD-10-CM

## 2022-05-22 DIAGNOSIS — D500 Iron deficiency anemia secondary to blood loss (chronic): Secondary | ICD-10-CM

## 2022-05-22 NOTE — Progress Notes (Signed)
Assessment & Plan:  Rebecca Sharp was seen today for hypertension.  Diagnoses and all orders for this visit:  Essential hypertension -     CMP14+EGFR -     lisinopril (ZESTRIL) 5 MG tablet; Take 1 tablet (5 mg total) by mouth daily.  Need for Tdap vaccination -     Tdap vaccine greater than or equal to 50yo IM  Need for hepatitis C screening test -     HCV Ab w Reflex to Quant PCR  Iron deficiency anemia due to chronic blood loss -     CBC with Differential  Type 2 diabetes mellitus with hyperglycemia, without long-term current use of insulin (HCC) -     Hemoglobin A1c  Other orders -     Interpretation:    Patient has been counseled on age-appropriate routine health concerns for screening and prevention. These are reviewed and up-to-date. Referrals have been placed accordingly. Immunizations are up-to-date or declined.    Subjective:   Chief Complaint  Patient presents with   Hypertension   HPI Rebecca Sharp 51 y.o. female presents to office today for follow up to HTN  VRI was used to communicate directly with patient for the entire encounter including providing detailed patient instructions.    HTN Blood pressure is low normal today. She is asymptomatic. Will decrease lisinopril to 5 mg today. She does not monitor her blood pressure at home.  BP Readings from Last 3 Encounters:  05/22/22 99/67  01/18/22 114/78  09/23/21 117/75     Review of Systems  Constitutional:  Negative for fever, malaise/fatigue and weight loss.  HENT: Negative.  Negative for nosebleeds.   Eyes: Negative.  Negative for blurred vision, double vision and photophobia.  Respiratory: Negative.  Negative for cough and shortness of breath.   Cardiovascular: Negative.  Negative for chest pain, palpitations and leg swelling.  Gastrointestinal: Negative.  Negative for heartburn, nausea and vomiting.  Musculoskeletal: Negative.  Negative for myalgias.  Neurological: Negative.  Negative for  dizziness, focal weakness, seizures and headaches.  Psychiatric/Behavioral: Negative.  Negative for suicidal ideas.     Past Medical History:  Diagnosis Date   AMA (advanced maternal age) multigravida 35+    Anemia    Diabetes mellitus without complication ([place])    Ganglion cyst 01/25/2017   Left ankle   Kidney stones 2013   With pregnancy    Language barrier    Obese    Obesity 01/25/2017   Pregnancy induced hypertension    at end of last 2 pregnancies    Past Surgical History:  Procedure Laterality Date   NO PAST SURGERIES      Family History  Problem Relation Age of Onset   Diabetes Father    Hypertension Father     Social History Reviewed with no changes to be made today.   Outpatient Medications Prior to Visit  Medication Sig Dispense Refill   betamethasone dipropionate 0.05 % cream APPLY TOPICALLY 2 (TWO) TIMES DAILY. 60 g 1   ferrous sulfate (FEROSUL) 325 (65 FE) MG tablet Take 1 tablet (325 mg total) by mouth daily with breakfast. 90 tablet 2   metFORMIN (GLUCOPHAGE) 500 MG tablet Take 1 tablet (500 mg total) by mouth 2 (two) times daily with a meal. 180 tablet 1   Omega-3 Fatty Acids (FISH OIL) 1000 MG CPDR Take 3 g by mouth daily. 90 capsule 5   lisinopril (ZESTRIL) 10 MG tablet TAKE 1 TABLET (10 MG TOTAL) BY MOUTH DAILY. 90 tablet  3   Blood Glucose Monitoring Suppl (TRUE METRIX METER) w/Device KIT 1 each by Does not apply route 2 (two) times daily. (Patient not taking: Reported on 05/22/2022) 1 kit 0   glucose blood (TRUE METRIX BLOOD GLUCOSE TEST) test strip Use as instructed (Patient not taking: Reported on 05/22/2022) 100 each 12   TRUEplus Lancets 28G MISC USE AS DIRECTED 2 (TWO) TIMES DAILY. (Patient not taking: Reported on 05/22/2022) 100 each 0   escitalopram (LEXAPRO) 10 MG tablet Take 1 tablet (10 mg total) by mouth daily. (Patient not taking: Reported on 05/22/2022) 90 tablet 1   nystatin cream (MYCOSTATIN) Apply 1 application topically 2 (two) times daily.  VAGINALLY (Patient not taking: Reported on 05/22/2022) 60 g 0   No facility-administered medications prior to visit.    No Known Allergies     Objective:    BP 99/67   Pulse 87   Temp 98.2 F (36.8 C) (Oral)   Ht _0  (1.499 m)   Wt 178 lb (80.7 kg)   SpO2 99%   BMI 35.95 kg/m  Wt Readings from Last 3 Encounters:  05/22/22 178 lb (80.7 kg)  01/18/22 180 lb (81.6 kg)  09/23/21 177 lb 6 oz (80.5 kg)    Physical Exam Vitals and nursing note reviewed.  Constitutional:      Appearance: She is well-developed.  HENT:     Head: Normocephalic and atraumatic.  Cardiovascular:     Rate and Rhythm: Normal rate and regular rhythm.     Heart sounds: Normal heart sounds. No murmur heard.    No friction rub. No gallop.  Pulmonary:     Effort: Pulmonary effort is normal. No tachypnea or respiratory distress.     Breath sounds: Normal breath sounds. No decreased breath sounds, wheezing, rhonchi or rales.  Chest:     Chest wall: No tenderness.  Abdominal:     General: Bowel sounds are normal.     Palpations: Abdomen is soft.  Musculoskeletal:        General: Normal range of motion.     Cervical back: Normal range of motion.  Skin:    General: Skin is warm and dry.  Neurological:     Mental Status: She is alert and oriented to person, place, and time.     Coordination: Coordination normal.  Psychiatric:        Behavior: Behavior normal. Behavior is cooperative.        Thought Content: Thought content normal.        Judgment: Judgment normal.          Patient has been counseled extensively about nutrition and exercise as well as the importance of adherence with medications and regular follow-up. The patient was given clear instructions to go to ER or return to medical center if symptoms don't improve, worsen or new problems develop. The patient verbalized understanding.   Follow-up: Return in about 3 months (around 08/22/2022) for DM/HTN.   Gildardo Pounds, FNP-BC [facility] and [place], [place]   05/23/2022, 6:56 PM

## 2022-05-22 NOTE — Progress Notes (Unsigned)
No concerns. 

## 2022-05-23 ENCOUNTER — Encounter: Payer: Self-pay | Admitting: Nurse Practitioner

## 2022-05-23 LAB — CMP14+EGFR
ALT: 15 IU/L (ref 0–32)
AST: 12 IU/L (ref 0–40)
Albumin/Globulin Ratio: 1.4 (ref 1.2–2.2)
Albumin: 4.2 g/dL (ref 3.9–4.9)
Alkaline Phosphatase: 58 IU/L (ref 44–121)
BUN/Creatinine Ratio: 19 (ref 9–23)
BUN: 16 mg/dL (ref 6–24)
Bilirubin Total: 0.2 mg/dL (ref 0.0–1.2)
CO2: 21 mmol/L (ref 20–29)
Calcium: 9.3 mg/dL (ref 8.7–10.2)
Chloride: 103 mmol/L (ref 96–106)
Creatinine, Ser: 0.85 mg/dL (ref 0.57–1.00)
Globulin, Total: 3.1 g/dL (ref 1.5–4.5)
Glucose: 118 mg/dL — ABNORMAL HIGH (ref 70–99)
Potassium: 4.3 mmol/L (ref 3.5–5.2)
Sodium: 137 mmol/L (ref 134–144)
Total Protein: 7.3 g/dL (ref 6.0–8.5)
eGFR: 84 mL/min/{1.73_m2} (ref >59)

## 2022-05-23 LAB — CBC WITH DIFFERENTIAL/PLATELET
Basophils Absolute: 0 10*3/uL (ref 0.0–0.2)
Basos: 1 %
EOS (ABSOLUTE): 0.3 10*3/uL (ref 0.0–0.4)
Eos: 3 %
Hematocrit: 33.3 % — ABNORMAL LOW (ref 34.0–46.6)
Hemoglobin: 11 g/dL — ABNORMAL LOW (ref 11.1–15.9)
Immature Grans (Abs): 0 10*3/uL (ref 0.0–0.1)
Immature Granulocytes: 0 %
Lymphocytes Absolute: 2.2 10*3/uL (ref 0.7–3.1)
Lymphs: 25 %
MCH: 29.1 pg (ref 26.6–33.0)
MCHC: 33 g/dL (ref 31.5–35.7)
MCV: 88 fL (ref 79–97)
Monocytes Absolute: 0.5 10*3/uL (ref 0.1–0.9)
Monocytes: 6 %
Neutrophils Absolute: 5.8 10*3/uL (ref 1.4–7.0)
Neutrophils: 65 %
Platelets: 252 10*3/uL (ref 150–450)
RBC: 3.78 x10E6/uL (ref 3.77–5.28)
RDW: 13.2 % (ref 11.7–15.4)
WBC: 8.8 10*3/uL (ref 3.4–10.8)

## 2022-05-23 LAB — HCV INTERPRETATION

## 2022-05-23 LAB — HEMOGLOBIN A1C
Est. average glucose Bld gHb Est-mCnc: 108 mg/dL
Hgb A1c MFr Bld: 5.4 % (ref 4.8–5.6)

## 2022-05-23 LAB — HCV AB W REFLEX TO QUANT PCR: HCV Ab: NONREACTIVE

## 2022-05-23 MED ORDER — LISINOPRIL 5 MG PO TABS
5.0000 mg | ORAL_TABLET | Freq: Every day | ORAL | 1 refills | Status: DC
  Filled 2022-05-23: qty 30, 30d supply, fill #0

## 2022-05-24 ENCOUNTER — Other Ambulatory Visit: Payer: Self-pay

## 2022-05-29 ENCOUNTER — Telehealth: Payer: Self-pay | Admitting: Emergency Medicine

## 2022-05-29 ENCOUNTER — Other Ambulatory Visit: Payer: Self-pay | Admitting: Nurse Practitioner

## 2022-05-29 DIAGNOSIS — D649 Anemia, unspecified: Secondary | ICD-10-CM

## 2022-05-29 NOTE — Telephone Encounter (Signed)
Copied from CRM [id number]. Topic: General - Other >> May 29, 2022  3:25 PM Macon Large wrote: Reason for CRM: Pt called for her most recent lab results. Pt requests call back to go over the lab results.

## 2022-05-29 NOTE — Telephone Encounter (Signed)
Pt is aware of result and would like new referral to Oncology for her anemia.

## 2022-05-30 ENCOUNTER — Other Ambulatory Visit: Payer: Self-pay

## 2022-05-30 ENCOUNTER — Telehealth: Payer: Self-pay | Admitting: Physician Assistant

## 2022-05-30 NOTE — Telephone Encounter (Signed)
Scheduled appt per 8/14 referral using interpreter services. Pt is aware of appt date and time. Pt is aware to arrive 15 mins prior to appt time and to bring and updated insurance card. Pt is aware of appt location.

## 2022-06-15 ENCOUNTER — Ambulatory Visit
Admission: RE | Admit: 2022-06-15 | Discharge: 2022-06-15 | Disposition: A | Discharge status: Home / Self Care | Payer: No Typology Code available for payment source | Source: Ambulatory Visit | Attending: Nurse Practitioner | Admitting: Nurse Practitioner

## 2022-06-15 ENCOUNTER — Ambulatory Visit: Payer: Self-pay | Admitting: Hematology and Oncology

## 2022-06-15 VITALS — BP 117/73 | Wt 177.0 lb

## 2022-06-15 DIAGNOSIS — Z1231 Encounter for screening mammogram for malignant neoplasm of breast: Secondary | ICD-10-CM

## 2022-06-15 DIAGNOSIS — Z1211 Encounter for screening for malignant neoplasm of colon: Secondary | ICD-10-CM

## 2022-06-15 NOTE — Progress Notes (Signed)
[name] is a 51 y.o. female who presents to [facility] clinic today with no complaints for annual breast screening. Patient denies pain and nipple discharge.    Pap Smear: Pap smear not completed today. Last Pap smear was 06/09/2021 at [facility] clinic and was normal. Per patient has no history of an abnormal Pap smear. Last Pap smear result is available in Epic.   Physical exam:  Breasts Breasts symmetrical. Scattered areas of light skin discoloration across bilateral breasts. No nipple retraction bilateral breasts. No nipple discharge bilateral breasts. No lymphadenopathy. No lumps palpated bilateral breasts.      MS DIGITAL SCREENING TOMO BILATERAL  Result Date: 06/15/2021 CLINICAL DATA:  Screening. EXAM: DIGITAL SCREENING BILATERAL MAMMOGRAM WITH TOMOSYNTHESIS AND CAD TECHNIQUE: Bilateral screening digital craniocaudal and mediolateral oblique mammograms were obtained. Bilateral screening digital breast tomosynthesis was performed. The images were evaluated with computer-aided detection. COMPARISON:  Previous exam(s). ACR Breast Density Category c: The breast tissue is heterogeneously dense, which may obscure small masses. FINDINGS: There are no findings suspicious for malignancy. IMPRESSION: No mammographic evidence of malignancy. A result letter of this screening mammogram will be mailed directly to the patient. RECOMMENDATION: Screening mammogram in one year. (Code:SM-B-01Y) BI-RADS CATEGORY  1: Negative. Electronically Signed   By: Ileana Roup M.D.   On: 06/15/2021 15:53   MS DIGITAL SCREENING TOMO BILATERAL  Result Date: 05/13/2020 CLINICAL DATA:  Screening. EXAM: DIGITAL SCREENING BILATERAL MAMMOGRAM WITH TOMO AND CAD COMPARISON:  Previous exam(s). ACR Breast Density Category c: The breast tissue is heterogeneously dense, which may obscure small masses. FINDINGS: There are no findings suspicious for malignancy. Images were processed with CAD. IMPRESSION: No mammographic evidence of  malignancy. A result letter of this screening mammogram will be mailed directly to the patient. RECOMMENDATION: Screening mammogram in one year. (Code:SM-B-01Y) BI-RADS CATEGORY  1: Negative. Electronically Signed   By: Evangeline Dakin M.D.   On: 05/13/2020 12:46   MS DIGITAL SCREENING TOMO BILATERAL  Result Date: 05/07/2019 CLINICAL DATA:  Screening. EXAM: DIGITAL SCREENING BILATERAL MAMMOGRAM WITH TOMO AND CAD COMPARISON:  Previous exam(s). ACR Breast Density Category c: The breast tissue is heterogeneously dense, which may obscure small masses. FINDINGS: There are no findings suspicious for malignancy. Images were processed with CAD. IMPRESSION: No mammographic evidence of malignancy. A result letter of this screening mammogram will be mailed directly to the patient. RECOMMENDATION: Screening mammogram in one year. (Code:SM-B-01Y) BI-RADS CATEGORY  1: Negative. Electronically Signed   By: Lajean Manes M.D.   On: 05/07/2019 09:06   MS DIGITAL SCREENING TOMO BILATERAL  Result Date: 02/19/2018 CLINICAL DATA:  Screening. EXAM: DIGITAL SCREENING BILATERAL MAMMOGRAM WITH TOMO AND CAD COMPARISON:  Previous exam(s). ACR Breast Density Category c: The breast tissue is heterogeneously dense, which may obscure small masses. FINDINGS: There are no findings suspicious for malignancy. Images were processed with CAD. IMPRESSION: No mammographic evidence of malignancy. A result letter of this screening mammogram will be mailed directly to the patient. RECOMMENDATION: Screening mammogram in one year. (Code:SM-B-01Y) BI-RADS CATEGORY  1: Negative. Electronically Signed   By: Nolon Nations M.D.   On: 02/19/2018 15:42      Pelvic/Bimanual Pap is not indicated today.   Smoking History: Patient has never smoked.   Patient Navigation: Patient education provided. Access to services provided for patient through [place] interpreter provided.   Colorectal Cancer Screening: Per patient has  never had colonoscopy completed. Completed FIT test 06/20/2021 at [facility] and results were negative. No  complaints today. FIT test kit given.   Breast and Cervical Cancer Risk Assessment: Patient does not have family history of breast cancer, known genetic mutations, or radiation treatment to the chest before age 60. Patient does not have history of cervical dysplasia, immunocompromised, or DES exposure in-utero.   Risk Assessment     Risk Scores       06/15/2022 06/09/2021   Last edited by: Royston Bake, CMA Royston Bake, CMA   5-year risk: 0.6 % 0.6 %   Lifetime risk: 5.8 % 5.8 %            A: BCCCP exam without pap smear No complaints or issues found on assessment.   P: Referred patient to the [place] for a screening mammogram. Appointment scheduled for Thursday, June 15, 2022 at 3:20 pm.  Toy Care, RN 06/15/2022 3:30 PM

## 2022-06-15 NOTE — Progress Notes (Addendum)
[name] is a 51 y.o. female who presents to [facility] clinic today with no complaints .    Pap Smear: Pap not smear completed today. Last Pap smear was 06/09/2021 at [facility] clinic and was normal. Per patient has no history of an abnormal Pap smear. Last Pap smear result is available in Epic.   Physical exam: Breasts Breasts symmetrical. No skin abnormalities bilateral breasts. No nipple retraction bilateral breasts. No nipple discharge bilateral breasts. No lymphadenopathy. No lumps palpated bilateral breasts.      MS DIGITAL SCREENING TOMO BILATERAL  Result Date: 06/15/2021 CLINICAL DATA:  Screening. EXAM: DIGITAL SCREENING BILATERAL MAMMOGRAM WITH TOMOSYNTHESIS AND CAD TECHNIQUE: Bilateral screening digital craniocaudal and mediolateral oblique mammograms were obtained. Bilateral screening digital breast tomosynthesis was performed. The images were evaluated with computer-aided detection. COMPARISON:  Previous exam(s). ACR Breast Density Category c: The breast tissue is heterogeneously dense, which may obscure small masses. FINDINGS: There are no findings suspicious for malignancy. IMPRESSION: No mammographic evidence of malignancy. A result letter of this screening mammogram will be mailed directly to the patient. RECOMMENDATION: Screening mammogram in one year. (Code:SM-B-01Y) BI-RADS CATEGORY  1: Negative. Electronically Signed   By: Sherron Ales M.D.   On: 06/15/2021 15:53   MS DIGITAL SCREENING TOMO BILATERAL  Result Date: 05/13/2020 CLINICAL DATA:  Screening. EXAM: DIGITAL SCREENING BILATERAL MAMMOGRAM WITH TOMO AND CAD COMPARISON:  Previous exam(s). ACR Breast Density Category c: The breast tissue is heterogeneously dense, which may obscure small masses. FINDINGS: There are no findings suspicious for malignancy. Images were processed with CAD. IMPRESSION: No mammographic evidence of malignancy. A result letter of this screening mammogram will be mailed directly to the patient.  RECOMMENDATION: Screening mammogram in one year. (Code:SM-B-01Y) BI-RADS CATEGORY  1: Negative. Electronically Signed   By: Hulan Saas M.D.   On: 05/13/2020 12:46   MS DIGITAL SCREENING TOMO BILATERAL  Result Date: 05/07/2019 CLINICAL DATA:  Screening. EXAM: DIGITAL SCREENING BILATERAL MAMMOGRAM WITH TOMO AND CAD COMPARISON:  Previous exam(s). ACR Breast Density Category c: The breast tissue is heterogeneously dense, which may obscure small masses. FINDINGS: There are no findings suspicious for malignancy. Images were processed with CAD. IMPRESSION: No mammographic evidence of malignancy. A result letter of this screening mammogram will be mailed directly to the patient. RECOMMENDATION: Screening mammogram in one year. (Code:SM-B-01Y) BI-RADS CATEGORY  1: Negative. Electronically Signed   By: Amie Portland M.D.   On: 05/07/2019 09:06   MS DIGITAL SCREENING TOMO BILATERAL  Result Date: 02/19/2018 CLINICAL DATA:  Screening. EXAM: DIGITAL SCREENING BILATERAL MAMMOGRAM WITH TOMO AND CAD COMPARISON:  Previous exam(s). ACR Breast Density Category c: The breast tissue is heterogeneously dense, which may obscure small masses. FINDINGS: There are no findings suspicious for malignancy. Images were processed with CAD. IMPRESSION: No mammographic evidence of malignancy. A result letter of this screening mammogram will be mailed directly to the patient. RECOMMENDATION: Screening mammogram in one year. (Code:SM-B-01Y) BI-RADS CATEGORY  1: Negative. Electronically Signed   By: Norva Pavlov M.D.   On: 02/19/2018 15:42    Pelvic/Bimanual Pap is not indicated today    Smoking History: Patient has never smoked and was not referred to quit line.    Patient Navigation: Patient education provided. Access to services provided for patient through BCCCP program. [place] interpreter provided. No transportation provided   Colorectal Cancer Screening: Per patient has never had colonoscopy completed No  complaints today. FIT test given today.   Breast and Cervical Cancer Risk Assessment:  Patient does not have family history of breast cancer, known genetic mutations, or radiation treatment to the chest before age 21. Patient does not have history of cervical dysplasia, immunocompromised, or DES exposure in-utero.  Risk Assessment     Risk Scores       06/15/2022 06/09/2021   Last edited by: Meryl Dare, CMA Meryl Dare, CMA   5-year risk: 0.6 % 0.6 %   Lifetime risk: 5.8 % 5.8 %            A: BCCCP exam without pap smear No complaints today. Screening mammogram scheduled.  P: [facility] for a screening mammogram. Appointment scheduled 06/15/2022.  Rebecca Sharp A, NP 06/15/2022 3:01 PM

## 2022-06-21 ENCOUNTER — Inpatient Hospital Stay: Payer: No Typology Code available for payment source

## 2022-06-21 ENCOUNTER — Inpatient Hospital Stay: Payer: No Typology Code available for payment source | Attending: Physician Assistant | Admitting: Physician Assistant

## 2022-06-21 ENCOUNTER — Other Ambulatory Visit: Payer: Self-pay

## 2022-06-21 ENCOUNTER — Encounter: Payer: Self-pay | Admitting: Physician Assistant

## 2022-06-21 VITALS — BP 129/67 | HR 85 | Temp 97.7°F | Resp 17 | Wt 178.6 lb

## 2022-06-21 DIAGNOSIS — D649 Anemia, unspecified: Secondary | ICD-10-CM | POA: Insufficient documentation

## 2022-06-21 LAB — CMP (CANCER CENTER ONLY)
ALT: 17 U/L (ref 0–44)
AST: 16 U/L (ref 15–41)
Albumin: 3.9 g/dL (ref 3.5–5.0)
Alkaline Phosphatase: 45 U/L (ref 38–126)
Anion gap: 8 (ref 5–15)
BUN: 20 mg/dL (ref 6–20)
CO2: 23 mmol/L (ref 22–32)
Calcium: 9.2 mg/dL (ref 8.9–10.3)
Chloride: 108 mmol/L (ref 98–111)
Creatinine: 0.81 mg/dL (ref 0.44–1.00)
GFR, Estimated: >60 mL/min (ref >60)
Glucose, Bld: 101 mg/dL — ABNORMAL HIGH (ref 70–99)
Potassium: 4 mmol/L (ref 3.5–5.1)
Sodium: 139 mmol/L (ref 135–145)
Total Bilirubin: 0.4 mg/dL (ref 0.3–1.2)
Total Protein: 7.7 g/dL (ref 6.5–8.1)

## 2022-06-21 LAB — CBC WITH DIFFERENTIAL (CANCER CENTER ONLY)
Abs Immature Granulocytes: 0.02 10*3/uL (ref 0.00–0.07)
Basophils Absolute: 0 10*3/uL (ref 0.0–0.1)
Basophils Relative: 1 %
Eosinophils Absolute: 0.2 10*3/uL (ref 0.0–0.5)
Eosinophils Relative: 3 %
HCT: 31.4 % — ABNORMAL LOW (ref 36.0–46.0)
Hemoglobin: 10.6 g/dL — ABNORMAL LOW (ref 12.0–15.0)
Immature Granulocytes: 0 %
Lymphocytes Relative: 22 %
Lymphs Abs: 1.7 10*3/uL (ref 0.7–4.0)
MCH: 29.4 pg (ref 26.0–34.0)
MCHC: 33.8 g/dL (ref 30.0–36.0)
MCV: 87 fL (ref 80.0–100.0)
Monocytes Absolute: 0.4 10*3/uL (ref 0.1–1.0)
Monocytes Relative: 5 %
Neutro Abs: 5.5 10*3/uL (ref 1.7–7.7)
Neutrophils Relative %: 69 %
Platelet Count: 253 10*3/uL (ref 150–400)
RBC: 3.61 MIL/uL — ABNORMAL LOW (ref 3.87–5.11)
RDW: 13.4 % (ref 11.5–15.5)
WBC Count: 7.9 10*3/uL (ref 4.0–10.5)
nRBC: 0 % (ref 0.0–0.2)

## 2022-06-21 LAB — RETIC PANEL
Immature Retic Fract: 11.6 % (ref 2.3–15.9)
RBC.: 3.62 MIL/uL — ABNORMAL LOW (ref 3.87–5.11)
Retic Count, Absolute: 75.7 10*3/uL (ref 19.0–186.0)
Retic Ct Pct: 2.1 % (ref 0.4–3.1)
Reticulocyte Hemoglobin: 31.8 pg (ref >27.9)

## 2022-06-21 LAB — IRON AND IRON BINDING CAPACITY (CC-WL,HP ONLY)
Iron: 58 ug/dL (ref 28–170)
Saturation Ratios: 15 % (ref 10.4–31.8)
TIBC: 397 ug/dL (ref 250–450)
UIBC: 339 ug/dL

## 2022-06-21 LAB — FOLATE: Folate: 13.9 ng/mL (ref >5.9)

## 2022-06-21 NOTE — Progress Notes (Signed)
[place] [phone]   Fax:([phone] [place] NOTE  Patient Care Team: Gildardo Pounds, NP as PCP - General (Nurse Practitioner)  CHIEF COMPLAINTS/PURPOSE OF CONSULTATION:  Normocytic anemia  HISTORY OF PRESENTING ILLNESS:  Rebecca Sharp 51 y.o. female with medical history significant for diabetes and iron deficiency presents to the clinic for normocytic anemia.  She is accompanied by Almyra Free, [place] translator.  On review of the previous records, there is evidence of chronic anemia that has been present as far back as 2013.  Most recent labs from 05/22/2022 showed a hemoglobin of 11.0, MCV 88.  Patient has history of iron deficiency and has been taking ferrous sulfate 325 mg once a day as prescribed.  On exam today, Ms. Marlowe Sax reports that she does have periodic episodes of fatigue but she continues to complete her daily activities on her own.  She has a good appetite and denies any weight changes.  She denies any nausea, vomiting or abdominal pain.  Bowel habits are unchanged without any diarrhea or constipation.  She reports that her menstrual cycles are somewhat irregular recently including have two cycles per month.  Each cycle lasts approximately 3 days with only 1 day of heavy bleeding.  She is not taking any hormonal therapy at this time.  She has no other signs of bleeding including hematochezia or melena.  Patient denies fevers, chills, night sweats, shortness of breath, chest pain or dyspnea complaints.  Rest of ROS is below.  MEDICAL HISTORY:  Past Medical History:  Diagnosis Date   AMA (advanced maternal age) multigravida 35+    Anemia    Diabetes mellitus without complication ([place])    Ganglion cyst 01/25/2017   Left ankle   Kidney stones 2013   With pregnancy    Language barrier    Obese    Obesity 01/25/2017   Pregnancy induced hypertension    at end of last 2 pregnancies    SURGICAL HISTORY: Past Surgical History:   Procedure Laterality Date   NO PAST SURGERIES      SOCIAL HISTORY: Social History   Socioeconomic History   Marital status: Single    Spouse name: Not on file   Number of children: 3   Years of education: <8th grade   Highest education level: 6th grade  Occupational History   Occupation: Airline pilot: [employer]  Tobacco Use   Smoking status: Never   Smokeless tobacco: Never  Vaping Use   Vaping Use: Never used  Substance and Sexual Activity   Alcohol use: No    Alcohol/week: 0.0 standard drinks of alcohol   Drug use: No   Sexual activity: Yes    Birth control/protection: Condom, None    Comment: No current boyfriend or spouse-not interested  Other Topics Concern   Not on file  Social History Narrative   Not on file   Social Determinants of Health   Financial Resource Strain: Not on file  Food Insecurity: Food Insecurity Present (06/15/2022)   Hunger Vital Sign    Worried About Running Out of Food in the Last Year: Sometimes true    Ran Out of Food in the Last Year: Sometimes true  Transportation Needs: No Transportation Needs (06/15/2022)   PRAPARE - Hydrologist (Medical): No    Lack of Transportation (Non-Medical): No  Physical Activity: Not on file  Stress: Not on file  Social Connections: Not on file  Intimate Partner Violence:  Not on file    FAMILY HISTORY: Family History  Problem Relation Age of Onset   Diabetes Father    Hypertension Father    Breast cancer Neg Hx     ALLERGIES:  has No Known Allergies.  MEDICATIONS:  Current Outpatient Medications  Medication Sig Dispense Refill   betamethasone dipropionate 0.05 % cream APPLY TOPICALLY 2 (TWO) TIMES DAILY. 60 g 1   Blood Glucose Monitoring Suppl (TRUE METRIX METER) w/Device KIT 1 each by Does not apply route 2 (two) times daily. 1 kit 0   ferrous sulfate (FEROSUL) 325 (65 FE) MG tablet Take 1 tablet (325 mg total) by mouth daily with breakfast. 90 tablet 2    glucose blood (TRUE METRIX BLOOD GLUCOSE TEST) test strip Use as instructed 100 each 12   lisinopril (ZESTRIL) 5 MG tablet Take 1 tablet (5 mg total) by mouth daily. 90 tablet 1   metFORMIN (GLUCOPHAGE) 500 MG tablet Take 1 tablet (500 mg total) by mouth 2 (two) times daily with a meal. 180 tablet 1   Omega-3 Fatty Acids (FISH OIL) 1000 MG CPDR Take 3 g by mouth daily. 90 capsule 5   TRUEplus Lancets 28G MISC USE AS DIRECTED 2 (TWO) TIMES DAILY. 100 each 0   No current facility-administered medications for this visit.    REVIEW OF SYSTEMS:   Constitutional: ( - ) fevers, ( - )  chills , ( - ) night sweats Eyes: ( - ) blurriness of vision, ( - ) double vision, ( - ) watery eyes Ears, nose, mouth, throat, and face: ( - ) mucositis, ( - ) sore throat Respiratory: ( - ) cough, ( - ) dyspnea, ( - ) wheezes Cardiovascular: ( - ) palpitation, ( - ) chest discomfort, ( - ) lower extremity swelling Gastrointestinal:  ( - ) nausea, ( - ) heartburn, ( - ) change in bowel habits Skin: ( - ) abnormal skin rashes Lymphatics: ( - ) new lymphadenopathy, ( - ) easy bruising Neurological: ( - ) numbness, ( - ) tingling, ( - ) new weaknesses Behavioral/Psych: ( - ) mood change, ( - ) new changes  All other systems were reviewed with the patient and are negative.  PHYSICAL EXAMINATION: ECOG PERFORMANCE STATUS: 1 - Symptomatic but completely ambulatory  Vitals:   06/21/22 1402  BP: 129/67  Pulse: 85  Resp: 17  Temp: 97.7 F (36.5 C)  SpO2: 99%   Filed Weights   06/21/22 1402  Weight: 178 lb 9 oz (81 kg)    GENERAL: well appearing female in NAD  SKIN: skin color, texture, turgor are normal, no rashes or significant lesions EYES: conjunctiva are pink and non-injected, sclera clear OROPHARYNX: no exudate, no erythema; lips, buccal mucosa, and tongue normal  NECK: supple, non-tender LYMPH:  no palpable lymphadenopathy in the cervical or supraclavicular lymph nodes.  LUNGS: clear to auscultation  and percussion with normal breathing effort HEART: regular rate & rhythm and no murmurs and no lower extremity edema ABDOMEN: soft, non-tender, non-distended, normal bowel sounds Musculoskeletal: no cyanosis of digits and no clubbing  PSYCH: alert & oriented x 3, fluent speech NEURO: no focal motor/sensory deficits  LABORATORY DATA:  I have reviewed the data as listed    Latest Ref Rng & Units 06/21/2022    2:53 PM 05/22/2022    4:22 PM 01/18/2022    4:42 PM  CBC  WBC 4.0 - 10.5 K/uL 7.9  8.8  7.8   Hemoglobin 12.0 - 15.0  g/dL 10.6  11.0  10.7   Hematocrit 36.0 - 46.0 % 31.4  33.3  33.1   Platelets 150 - 400 K/uL 253  252  248        Latest Ref Rng & Units 05/22/2022    4:22 PM 09/23/2021    9:07 AM 04/07/2021    9:29 AM  CMP  Glucose 70 - 99 mg/dL 118  92  112   BUN 6 - 24 mg/dL _0 Creatinine 0.57 - 1.00 mg/dL 0.85  0.77  0.66   Sodium 134 - 144 mmol/L 137  140  136   Potassium 3.5 - 5.2 mmol/L 4.3  4.6  4.3   Chloride 96 - 106 mmol/L 103  106  103   CO2 20 - 29 mmol/L _1 Calcium 8.7 - 10.2 mg/dL 9.3  9.1  9.0   Total Protein 6.0 - 8.5 g/dL 7.3  7.5  7.1   Total Bilirubin 0.0 - 1.2 mg/dL 0.2  0.4  0.3   Alkaline Phos 44 - 121 IU/L 58  62  71   AST 0 - 40 IU/L _2 ALT 0 - 32 IU/L _3 RADIOGRAPHIC STUDIES: I have personally reviewed the radiological images as listed and agreed with the findings in the report. MS DIGITAL SCREENING TOMO BILATERAL  Result Date: 06/20/2022 CLINICAL DATA:  Screening. EXAM: DIGITAL SCREENING BILATERAL MAMMOGRAM WITH TOMOSYNTHESIS AND CAD TECHNIQUE: Bilateral screening digital craniocaudal and mediolateral oblique mammograms were obtained. Bilateral screening digital breast tomosynthesis was performed. The images were evaluated with computer-aided detection. COMPARISON:  Previous exam(s). ACR Breast Density Category c: The breast tissue is heterogeneously dense, which may obscure small masses. FINDINGS: There are no  findings suspicious for malignancy. IMPRESSION: No mammographic evidence of malignancy. A result letter of this screening mammogram will be mailed directly to the patient. RECOMMENDATION: Screening mammogram in one year. (Code:SM-B-01Y) BI-RADS CATEGORY  1: Negative. Electronically Signed   By: Nolon Nations M.D.   On: 06/20/2022 07:50    ASSESSMENT & PLAN Rebecca Sharp is a 51 y.o. female who presents to the clinic for evaluation for normocytic anemia.  Patient's most recent 05/22/2020 shows very mild anemia with a hemoglobin 11.0.  Due to patient's history of iron deficiency while on p.o. iron supplementation, we recommend repeating her iron levels along with nutritional deficiencies.  If today's work-up is negative, we will have patient return for additional studies.  #Normocytic anemia:  --Currently on ferrous sulfate 325 mg once a day. --Labs today to check CBC, CMP, retic panel, iron and TIBC, ferritin, vitamin B12, MMA and folate levels --RTC in 3 months with repeat labs unless above workup requires additional intervention.   Orders Placed This Encounter  Procedures   CBC with Differential ([place] Only)    Standing Status:   Future    Number of Occurrences:   1    Standing Expiration Date:   06/21/2023   CMP ([place] only)    Standing Status:   Future    Number of Occurrences:   1    Standing Expiration Date:   06/21/2023   Ferritin    Standing Status:   Future    Number of Occurrences:   1    Standing Expiration Date:   06/21/2023   Iron and Iron Binding Capacity ([facility] only)    Standing Status:  Future    Number of Occurrences:   1    Standing Expiration Date:   06/21/2023   Retic Panel    Standing Status:   Future    Number of Occurrences:   1    Standing Expiration Date:   06/21/2023   Methylmalonic acid, serum    Standing Status:   Future    Number of Occurrences:   1    Standing Expiration Date:   06/20/2023   Folate, Serum    Standing Status:    Future    Number of Occurrences:   1    Standing Expiration Date:   06/20/2023    All questions were answered. The patient knows to call the clinic with any problems, questions or concerns.  I have spent a total of 60 minutes minutes of face-to-face and non-face-to-face time, preparing to see the patient, obtaining and/or reviewing separately obtained history, performing a medically appropriate examination, counseling and educating the patient, ordering tests/procedures, documenting clinical information in the electronic health record and care coordination.   Dede Query, PA-C Department of Hematology/Oncology [place] at [facility] Phone: [phone]  Patient was seen with Dr. Lorenso Courier  I have read the above note and personally examined the patient. I agree with the assessment and plan as noted above.  Briefly Rebecca Sharp is a 51 year old female who presents for evaluation of normocytic anemia.  At this time we will conduct a full work-up to include iron panel, ferritin, vitamin B12, and folate.  No evidence of kidney dysfunction.  If no clear etiology can be found would need to consider more extensive panel to include multiple myeloma work-up, hemolysis labs, and copper deficiency.  The patient voiced understanding of the plan moving forward.   Ledell Peoples, MD Department of Hematology/Oncology [place] at [facility] Phone: [phone] Pager: [phone] Email: [email or web address]

## 2022-06-22 LAB — FERRITIN: Ferritin: 15 ng/mL (ref 11–307)

## 2022-06-24 LAB — METHYLMALONIC ACID, SERUM: Methylmalonic Acid, Quantitative: 103 nmol/L (ref 0–378)

## 2022-06-28 ENCOUNTER — Other Ambulatory Visit: Payer: Self-pay | Admitting: Physician Assistant

## 2022-06-28 DIAGNOSIS — D509 Iron deficiency anemia, unspecified: Secondary | ICD-10-CM | POA: Insufficient documentation

## 2022-06-28 DIAGNOSIS — D508 Other iron deficiency anemias: Secondary | ICD-10-CM

## 2022-06-29 ENCOUNTER — Telehealth: Payer: Self-pay

## 2022-06-29 NOTE — Telephone Encounter (Signed)
-----   Message from Briant Cedar, PA-C sent at 06/28/2022  4:20 PM EDT ----- Please notify patient that there is iron deficiency anemia. Recommend IV iron infusions.    ----- Message ----- From: Interface, Lab In [facility] Sent: 06/21/2022   3:06 PM EDT To: Briant Cedar, PA-C

## 2022-06-29 NOTE — Telephone Encounter (Signed)
T/C to pt through Interpreter services.  Rebecca Sharp [id number]  interpreted results and pt was already aware and is waiting on a return call from the infusion center with her appt date and time

## 2022-07-02 LAB — FECAL OCCULT BLOOD, IMMUNOCHEMICAL: Fecal Occult Bld: NEGATIVE

## 2022-07-03 ENCOUNTER — Encounter: Payer: Self-pay | Admitting: Physician Assistant

## 2022-07-07 ENCOUNTER — Other Ambulatory Visit: Payer: Self-pay | Admitting: Physician Assistant

## 2022-07-07 ENCOUNTER — Other Ambulatory Visit: Payer: Self-pay | Admitting: *Deleted

## 2022-07-10 ENCOUNTER — Other Ambulatory Visit: Payer: Self-pay

## 2022-07-10 ENCOUNTER — Inpatient Hospital Stay: Payer: No Typology Code available for payment source

## 2022-07-10 VITALS — BP 100/72 | HR 68 | Temp 98.7°F | Resp 17

## 2022-07-10 DIAGNOSIS — D508 Other iron deficiency anemias: Secondary | ICD-10-CM

## 2022-07-10 MED ORDER — SODIUM CHLORIDE 0.9 % IV SOLN: Freq: Once | INTRAVENOUS | Status: AC

## 2022-07-10 MED ORDER — SODIUM CHLORIDE 0.9 % IV SOLN
1000.0000 mg | Freq: Once | INTRAVENOUS | Status: AC
  Administered 2022-07-10: 1000 mg via INTRAVENOUS
  Filled 2022-07-10: qty 10

## 2022-07-10 NOTE — Patient Instructions (Signed)
Iron Sucrose Injection Qu es este medicamento? El HIERRO SACAROSA trata los niveles bajos de hierro (anemia por deficiencia de hierro) en personas con enfermedad renal. El hierro es un mineral que cumple una funcin importante en la produccin de glbulos rojos, que llevan el oxgeno de los pulmones al resto del cuerpo. Este medicamento puede ser utilizado para otros usos; si tiene alguna pregunta consulte con su proveedor de atencin mdica o con su farmacutico. MARCAS COMUNES: Venofer Qu le debo informar a mi profesional de la salud antes de tomar este medicamento? Necesitan saber si usted presenta alguno de los siguientes problemas o situaciones: Anemia no causada por niveles bajos de hierro Enfermedad cardiaca Niveles altos de hierro en la sangre Enfermedad renal Enfermedad heptica Una reaccin alrgica o inusual al hierro, a otros medicamentos, alimentos, colorantes o conservantes Si est embarazada o buscando quedar embarazada Si est amamantando a un beb Cmo debo utilizar este medicamento? Este medicamento se administra mediante infusin en una vena. Se administra en un hospital o en un entorno clnico. Hable con su equipo de atencin sobre el uso de este medicamento en nios. Aunque este medicamento se puede recetar a nios tan pequeos como de 2 aos de edad con ciertas afecciones, existen precauciones que deben tomarse. Sobredosis: Pngase en contacto inmediatamente con un centro toxicolgico o una sala de urgencia si usted cree que haya tomado demasiado medicamento. ATENCIN: Este medicamento es solo para usted. No comparta este medicamento con nadie. Qu sucede si me olvido de una dosis? Es importante no olvidar ninguna dosis. Llame a su equipo de atencin si no puede asistir a una cita. Qu puede interactuar con este medicamento? No use este medicamento con ninguno de los siguientes productos: Deferoxamina Dimercaprol Otros productos con hierro Este medicamento  tambin podra interactuar con los siguientes productos: Cloranfenicol Deferasirox Puede ser que esta lista no menciona todas las posibles interacciones. Informe a su profesional de la salud de todos los productos a base de hierbas, medicamentos de venta libre o suplementos nutritivos que est tomando. Si usted fuma, consume bebidas alcohlicas o si utiliza drogas ilegales, indqueselo tambin a su profesional de la salud. Algunas sustancias pueden interactuar con su medicamento. A qu debo estar atento al usar este medicamento? Visite peridicamente a su equipo de atencin. Si los sntomas no comienzan a mejorar o si empeoran, consulte con su equipo de atencin. Usted podra necesitar realizarse anlisis de sangre mientras est usando este medicamento. Es posible que deba seguir una dieta especial. Hable con su equipo de atencin. Los alimentos que contienen hierro incluyen: granos enteros/cereales, frutas secas, legumbres, o guisantes, verduras de hojas verdes y asaduras (hgado, rin). Qu efectos secundarios puedo tener al utilizar este medicamento? Efectos secundarios que debe informar a su equipo de atencin tan pronto como sea posible: Reacciones alrgicas: erupcin cutnea, comezn/picazn, urticaria, hinchazn de la cara, los labios, la lengua o la garganta Presin arterial baja: mareo, sensacin de desmayo o aturdimiento, visin borrosa Falta de aliento Efectos secundarios que generalmente no requieren atencin mdica (debe informarlos a su equipo de atencin si persisten o si son molestos): Enrojecimiento Dolor de cabeza Dolor en las articulaciones Dolor muscular Nuseas Dolor, enrojecimiento o irritacin en el lugar de la inyeccin Puede ser que esta lista no menciona todos los posibles efectos secundarios. Comunquese a su mdico por asesoramiento mdico sobre los efectos secundarios. Usted puede informar los efectos secundarios a la FDA por telfono al 1-800-FDA-1088. Dnde debo  guardar mi medicina? Este medicamento se administra en hospitales o clnicas   y no necesitar guardarlo en su domicilio. ATENCIN: Este folleto es un resumen. Puede ser que no cubra toda la posible informacin. Si usted tiene preguntas acerca de esta medicina, consulte con su mdico, su farmacutico o su profesional de la salud.  2023 Elsevier/Gold Standard (2021-05-06 00:00:00)  

## 2022-07-24 ENCOUNTER — Other Ambulatory Visit: Payer: Self-pay

## 2022-07-24 ENCOUNTER — Encounter: Payer: Self-pay | Admitting: Physician Assistant

## 2022-07-28 ENCOUNTER — Encounter: Payer: Self-pay | Admitting: Physician Assistant

## 2022-07-31 ENCOUNTER — Encounter: Payer: Self-pay | Admitting: Physician Assistant

## 2022-07-31 ENCOUNTER — Other Ambulatory Visit: Payer: Self-pay

## 2022-08-23 ENCOUNTER — Encounter: Payer: Self-pay | Admitting: Physician Assistant

## 2022-08-23 ENCOUNTER — Other Ambulatory Visit: Payer: Self-pay

## 2022-08-23 ENCOUNTER — Encounter: Payer: Self-pay | Admitting: Nurse Practitioner

## 2022-08-23 ENCOUNTER — Ambulatory Visit: Payer: Self-pay | Attending: Nurse Practitioner | Admitting: Nurse Practitioner

## 2022-08-23 VITALS — BP 118/80 | HR 94 | Temp 98.1°F | Ht 59.0 in | Wt 178.0 lb

## 2022-08-23 DIAGNOSIS — I10 Essential (primary) hypertension: Secondary | ICD-10-CM

## 2022-08-23 DIAGNOSIS — D649 Anemia, unspecified: Secondary | ICD-10-CM

## 2022-08-23 DIAGNOSIS — R7303 Prediabetes: Secondary | ICD-10-CM

## 2022-08-23 DIAGNOSIS — E7800 Pure hypercholesterolemia, unspecified: Secondary | ICD-10-CM

## 2022-08-23 DIAGNOSIS — M25522 Pain in left elbow: Secondary | ICD-10-CM

## 2022-08-23 LAB — POCT GLYCOSYLATED HEMOGLOBIN (HGB A1C): HbA1c, POC (controlled diabetic range): 5.2 % (ref 0.0–7.0)

## 2022-08-23 MED ORDER — LISINOPRIL 5 MG PO TABS
5.0000 mg | ORAL_TABLET | Freq: Every day | ORAL | 1 refills | Status: DC
  Filled 2022-08-23: qty 90, 90d supply, fill #0
  Filled 2022-12-20: qty 90, 90d supply, fill #1

## 2022-08-23 MED ORDER — METFORMIN HCL 500 MG PO TABS
500.0000 mg | ORAL_TABLET | Freq: Two times a day (BID) | ORAL | 1 refills | Status: DC
  Filled 2022-08-23: qty 180, 90d supply, fill #0
  Filled 2023-01-31: qty 180, 90d supply, fill #1

## 2022-08-23 MED ORDER — DICLOFENAC SODIUM 1 % EX GEL
2.0000 g | Freq: Four times a day (QID) | CUTANEOUS | 1 refills | Status: AC
  Filled 2022-08-23: qty 100, 13d supply, fill #0
  Filled 2023-01-31: qty 100, 13d supply, fill #1

## 2022-08-23 MED ORDER — FERROUS SULFATE 325 (65 FE) MG PO TABS
325.0000 mg | ORAL_TABLET | Freq: Every day | ORAL | 2 refills | Status: DC
  Filled 2022-08-23: qty 90, 90d supply, fill #0
  Filled 2022-12-20: qty 90, 90d supply, fill #1
  Filled 2023-04-03: qty 90, 90d supply, fill #2

## 2022-08-23 MED ORDER — FISH OIL 1000 MG PO CPDR
3.0000 g | DELAYED_RELEASE_CAPSULE | Freq: Every day | ORAL | 5 refills | Status: DC
  Filled 2022-08-23: qty 90, fill #0

## 2022-08-23 MED ORDER — DICLOFENAC SODIUM 1 % EX GEL
2.0000 g | Freq: Four times a day (QID) | CUTANEOUS | 1 refills | Status: DC
  Filled 2022-08-23: qty 200, fill #0

## 2022-08-23 NOTE — Progress Notes (Deleted)
Left elbow pain  Lumps on both side of nose

## 2022-08-23 NOTE — Progress Notes (Signed)
Assessment & Plan:  Rebecca Sharp was seen today for hypertension and prediabetes.  Diagnoses and all orders for this visit:  Prediabetes -     POCT glycosylated hemoglobin (Hb A1C) -     metFORMIN (GLUCOPHAGE) 500 MG tablet; Take 1 tablet (500 mg total) by mouth 2 (two) times daily with a meal.  Anemia, unspecified type -     ferrous sulfate (FEROSUL) 325 (65 FE) MG tablet; Take 1 tablet (325 mg total) by mouth daily with breakfast.  Essential hypertension -     lisinopril (ZESTRIL) 5 MG tablet; Take 1 tablet (5 mg total) by mouth daily.  Hypercholesterolemia -     Omega-3 Fatty Acids (FISH OIL) 1000 MG CPDR; Take 3 g by mouth daily.  Neck and shoulder pain -     diclofenac Sodium (VOLTAREN) 1 % GEL; Apply 2 g topically to left elbow 4 (four) times daily as needed.      Patient has been counseled on age-appropriate routine health concerns for screening and prevention. These are reviewed and up-to-date. Referrals have been placed accordingly. Immunizations are up-to-date or declined.    Subjective:   Chief Complaint  Patient presents with   Prediabetes   HPI Rebecca Sharp 51 y.o. female presents to office today for follow up to Prediabetes    Prediabetes is well controlled with metformin 500 mg BID. LDL not quite at goal. She is taking omega 3 three thousand gms daily. On renal dose ACE as well. Blood pressure is well controlled.  Lab Results  Component Value Date   HGBA1C 5.2 08/23/2022    Lab Results  Component Value Date   LDLCALC 102 (H) 09/23/2021  The 10-year ASCVD risk score (Arnett DK, et al., 2019) is: 3.4%   Values used to calculate the score:     Age: 46 years     Sex: Female     Is Non-Hispanic African American: No     Diabetic: Yes     Tobacco smoker: No     Systolic Blood Pressure: 510 mmHg     Is BP treated: Yes     HDL Cholesterol: 36 mg/dL     Total Cholesterol: 160 mg/dL    BP Readings from Last 3 Encounters:  08/23/22 118/80  07/10/22  100/72  06/21/22 129/67     Elbow Pain: Patient complains of left elbow pain. Onset of the symptoms was several weeks ago. Inciting event: none known. Current symptoms include point tenderness in the lateral epicondyle . Pain is aggravated by: lifting heavy objects, direct pressure . Patient's overall course: well controlled. Patient has had no prior elbow problems. Evaluation to date: none. Treatment to date: nothing specific.  Review of Systems  Constitutional:  Negative for fever, malaise/fatigue and weight loss.  HENT: Negative.  Negative for nosebleeds.   Eyes: Negative.  Negative for blurred vision, double vision and photophobia.  Respiratory: Negative.  Negative for cough and shortness of breath.   Cardiovascular: Negative.  Negative for chest pain, palpitations and leg swelling.  Gastrointestinal: Negative.  Negative for heartburn, nausea and vomiting.  Musculoskeletal:  Positive for back pain (chronic), myalgias (chronic) and neck pain (chronic).  Neurological: Negative.  Negative for dizziness, focal weakness, seizures and headaches.  Psychiatric/Behavioral: Negative.  Negative for suicidal ideas.     Past Medical History:  Diagnosis Date   AMA (advanced maternal age) multigravida 35+    Anemia    Diabetes mellitus without complication ([place])    Ganglion cyst 01/25/2017  Left ankle   Kidney stones 2013   With pregnancy    Language barrier    Obese    Obesity 01/25/2017   Pregnancy induced hypertension    at end of last 2 pregnancies    Past Surgical History:  Procedure Laterality Date   NO PAST SURGERIES      Family History  Problem Relation Age of Onset   Diabetes Father    Hypertension Father    Breast cancer Neg Hx     Social History Reviewed with no changes to be made today.   Outpatient Medications Prior to Visit  Medication Sig Dispense Refill   betamethasone dipropionate 0.05 % cream APPLY TOPICALLY 2 (TWO) TIMES DAILY. 60 g 1   TRUEplus Lancets 28G  MISC USE AS DIRECTED 2 (TWO) TIMES DAILY. 100 each 0   ferrous sulfate (FEROSUL) 325 (65 FE) MG tablet Take 1 tablet (325 mg total) by mouth daily with breakfast. 90 tablet 2   lisinopril (ZESTRIL) 5 MG tablet Take 1 tablet (5 mg total) by mouth daily. 90 tablet 1   Blood Glucose Monitoring Suppl (TRUE METRIX METER) w/Device KIT 1 each by Does not apply route 2 (two) times daily. (Patient not taking: Reported on 08/23/2022) 1 kit 0   glucose blood (TRUE METRIX BLOOD GLUCOSE TEST) test strip Use as instructed (Patient not taking: Reported on 08/23/2022) 100 each 12   atorvastatin (LIPITOR) 20 MG tablet Take 1 tablet (20 mg total) by mouth daily. (Patient not taking: Reported on 09/27/2020) 90 tablet 1   metFORMIN (GLUCOPHAGE) 500 MG tablet Take 1 tablet (500 mg total) by mouth 2 (two) times daily with a meal. (Patient not taking: Reported on 08/23/2022) 180 tablet 1   Omega-3 Fatty Acids (FISH OIL) 1000 MG CPDR Take 3 g by mouth daily. (Patient not taking: Reported on 08/23/2022) 90 capsule 5   No facility-administered medications prior to visit.    No Known Allergies     Objective:    BP 118/80   Pulse 94   Temp 98.1 F (36.7 C) (Temporal)   Ht 4' 11" (1.499 m)   Wt 178 lb (80.7 kg)   SpO2 98%   BMI 35.95 kg/m  Wt Readings from Last 3 Encounters:  08/23/22 178 lb (80.7 kg)  06/21/22 178 lb 9 oz (81 kg)  06/15/22 177 lb (80.3 kg)    Physical Exam Vitals and nursing note reviewed.  Constitutional:      Appearance: She is well-developed.  HENT:     Head: Normocephalic and atraumatic.  Cardiovascular:     Rate and Rhythm: Normal rate and regular rhythm.     Heart sounds: Normal heart sounds. No murmur heard.    No friction rub. No gallop.  Pulmonary:     Effort: Pulmonary effort is normal. No tachypnea or respiratory distress.     Breath sounds: Normal breath sounds. No decreased breath sounds, wheezing, rhonchi or rales.  Chest:     Chest wall: No tenderness.  Abdominal:      General: Bowel sounds are normal.     Palpations: Abdomen is soft.  Musculoskeletal:        General: Normal range of motion.     Cervical back: Normal range of motion.  Skin:    General: Skin is warm and dry.  Neurological:     Mental Status: She is alert and oriented to person, place, and time.     Coordination: Coordination normal.  Psychiatric:  Behavior: Behavior normal. Behavior is cooperative.        Thought Content: Thought content normal.        Judgment: Judgment normal.          Patient has been counseled extensively about nutrition and exercise as well as the importance of adherence with medications and regular follow-up. The patient was given clear instructions to go to ER or return to medical center if symptoms don't improve, worsen or new problems develop. The patient verbalized understanding.   Follow-up: Return for 6 weeks for left elbow pain.   Gildardo Pounds, FNP-BC [facility] [place], [place]   08/28/2022, 8:46 AM

## 2022-08-28 ENCOUNTER — Encounter: Payer: Self-pay | Admitting: Nurse Practitioner

## 2022-09-03 ENCOUNTER — Emergency Department (HOSPITAL_COMMUNITY)
Admission: EM | Admit: 2022-09-03 | Discharge: 2022-09-04 | Disposition: A | Discharge status: Home / Self Care | Payer: Self-pay | Attending: Emergency Medicine | Admitting: Emergency Medicine

## 2022-09-03 DIAGNOSIS — I10 Essential (primary) hypertension: Secondary | ICD-10-CM | POA: Insufficient documentation

## 2022-09-03 DIAGNOSIS — Z79899 Other long term (current) drug therapy: Secondary | ICD-10-CM | POA: Insufficient documentation

## 2022-09-03 DIAGNOSIS — Z7984 Long term (current) use of oral hypoglycemic drugs: Secondary | ICD-10-CM | POA: Insufficient documentation

## 2022-09-03 DIAGNOSIS — E119 Type 2 diabetes mellitus without complications: Secondary | ICD-10-CM | POA: Insufficient documentation

## 2022-09-03 DIAGNOSIS — F410 Panic disorder [episodic paroxysmal anxiety] without agoraphobia: Secondary | ICD-10-CM | POA: Insufficient documentation

## 2022-09-03 NOTE — ED Provider Triage Note (Signed)
Emergency Medicine Provider Triage Evaluation Note  Rebecca Sharp , a 51 y.o. female  was evaluated in triage.  Pt complains of anxiety PTA. Had a tea with caffeine and it made her anxiety worse. Associated dizziness, shaking. Denies chest pain, shortness of breath, nausea, vomiting, abdominal pain, urinary symptoms. Has a history of anxiety and takes lexapro without missed doses. Feels similar to previous anxiety attacks. No history of MI.   Review of Systems  Positive:  Negative:   Physical Exam  There were no vitals taken for this visit. Gen:   Awake, no distress   Resp:  Normal effort  MSK:   Moves extremities without difficulty  Other:  No chest wall TTP. No TTP noted to abdominal wall.   Medical Decision Making  Medically screening exam initiated at 11:58 PM.  Appropriate orders placed.  Rebecca Sharp was informed that the remainder of the evaluation will be completed by another provider, this initial triage assessment does not replace that evaluation, and the importance of remaining in the ED until their evaluation is complete.     Cherrill Scrima A, PA-C 09/04/22 0002

## 2022-09-04 ENCOUNTER — Other Ambulatory Visit: Payer: Self-pay

## 2022-09-04 ENCOUNTER — Encounter (HOSPITAL_COMMUNITY): Payer: Self-pay | Admitting: Emergency Medicine

## 2022-09-04 ENCOUNTER — Emergency Department (HOSPITAL_COMMUNITY): Payer: Self-pay

## 2022-09-04 LAB — BASIC METABOLIC PANEL
Anion gap: 12 (ref 5–15)
BUN: 19 mg/dL (ref 6–20)
CO2: 21 mmol/L — ABNORMAL LOW (ref 22–32)
Calcium: 9.2 mg/dL (ref 8.9–10.3)
Chloride: 105 mmol/L (ref 98–111)
Creatinine, Ser: 0.87 mg/dL (ref 0.44–1.00)
GFR, Estimated: >60 mL/min (ref >60)
Glucose, Bld: 122 mg/dL — ABNORMAL HIGH (ref 70–99)
Potassium: 4.1 mmol/L (ref 3.5–5.1)
Sodium: 138 mmol/L (ref 135–145)

## 2022-09-04 LAB — CBC WITH DIFFERENTIAL/PLATELET
Abs Immature Granulocytes: 0.02 10*3/uL (ref 0.00–0.07)
Basophils Absolute: 0.1 10*3/uL (ref 0.0–0.1)
Basophils Relative: 1 %
Eosinophils Absolute: 0.2 10*3/uL (ref 0.0–0.5)
Eosinophils Relative: 2 %
HCT: 36.7 % (ref 36.0–46.0)
Hemoglobin: 12.5 g/dL (ref 12.0–15.0)
Immature Granulocytes: 0 %
Lymphocytes Relative: 23 %
Lymphs Abs: 2.1 10*3/uL (ref 0.7–4.0)
MCH: 30.3 pg (ref 26.0–34.0)
MCHC: 34.1 g/dL (ref 30.0–36.0)
MCV: 89.1 fL (ref 80.0–100.0)
Monocytes Absolute: 0.4 10*3/uL (ref 0.1–1.0)
Monocytes Relative: 5 %
Neutro Abs: 6.3 10*3/uL (ref 1.7–7.7)
Neutrophils Relative %: 69 %
Platelets: 262 10*3/uL (ref 150–400)
RBC: 4.12 MIL/uL (ref 3.87–5.11)
RDW: 13.4 % (ref 11.5–15.5)
WBC: 9.1 10*3/uL (ref 4.0–10.5)
nRBC: 0 % (ref 0.0–0.2)

## 2022-09-04 LAB — TROPONIN I (HIGH SENSITIVITY)
Troponin I (High Sensitivity): 9 ng/L (ref <18)
Troponin I (High Sensitivity): 9 ng/L (ref <18)

## 2022-09-04 NOTE — ED Triage Notes (Signed)
Pt reported to ED with c/o anxiety. Pt noted to be tearful during triage. Pt state she had tea with caffeine and it made her feel awful. Pt states she feels nervous, her feet are sweating and she feels dizzy.

## 2022-09-04 NOTE — ED Notes (Signed)
Spoke to interpreter to do  patient discharge

## 2022-09-04 NOTE — ED Provider Notes (Signed)
  Physical Exam  BP 130/82   Pulse 89   Temp 98 F (36.7 C) (Oral)   Resp 16   SpO2 100%   Physical Exam  Procedures  Procedures  ED Course / MDM    Medical Decision Making Amount and/or Complexity of Data Reviewed Radiology: ordered.   Accepted handoff at shift change from Berle Mull, PA-C. Please see prior provider note for more detail.   Briefly: Patient is 51 y.o. F presents to the ER with chest pain  DDX: concern for nonspecific chest pain vs. ACS  Plan: Awaiting second troponin. If normal, can be discharged home.   Second troponin 9 with initial being 9. Patient is safe for discharge with close outpatient follow up.      Achille Rich, PA-C 09/04/22 0735    Melene Plan, DO 09/04/22 [phone]

## 2022-09-04 NOTE — ED Provider Notes (Signed)
[facility] EMERGENCY DEPARTMENT Provider Note   CSN: [id number] Arrival date & time: 09/03/22  2355     History  Chief Complaint  Patient presents with   Anxiety    Rebecca Sharp is a 51 y.o. female.  HPI   Medical history including hypertension, prediabetes, anemia, anxiety presents  with complaints of a anxiety attack.  Patient states that she drank some tea with caffeine in it and states that she is very sensitive to caffeine, this was at 730, states after taking it she came very anxious, states that she had some sweaty feet and palms, states that she had slight shortness of breath and chest pain, she states  last about 30 to 40 minutes and then resolved, she states that she took her Lexapro seems to help, she states that she feels a thump in the middle of her chest currently, but has no actual chest pain no pleuritic chest pain, no history of PEs or DVTs currently not on hormone therapy no recent surgeries or long immobilization, she does not endorsing any suicidal homicidal ideations she does feel safe at home.  Home Medications Prior to Admission medications   Medication Sig Start Date End Date Taking? Authorizing Provider  betamethasone dipropionate 0.05 % cream APPLY TOPICALLY 2 (TWO) TIMES DAILY. 01/18/22 01/18/23  Argentina Donovan, PA-C  Blood Glucose Monitoring Suppl (TRUE METRIX METER) w/Device KIT 1 each by Does not apply route 2 (two) times daily. Patient not taking: Reported on 08/23/2022 08/21/19   Argentina Donovan, PA-C  diclofenac Sodium (VOLTAREN) 1 % GEL Apply 2 g topically to left elbow 4 (four) times daily as needed. 08/23/22 09/22/22  Gildardo Pounds, NP  ferrous sulfate (FEROSUL) 325 (65 FE) MG tablet Take 1 tablet (325 mg total) by mouth daily with breakfast. 08/23/22 08/23/23  Gildardo Pounds, NP  glucose blood (TRUE METRIX BLOOD GLUCOSE TEST) test strip Use as instructed Patient not taking: Reported on 08/23/2022 09/23/21   Gildardo Pounds, NP  lisinopril (ZESTRIL) 5 MG tablet Take 1 tablet (5 mg total) by mouth daily. 08/23/22 02/19/23  Gildardo Pounds, NP  metFORMIN (GLUCOPHAGE) 500 MG tablet Take 1 tablet (500 mg total) by mouth 2 (two) times daily with a meal. 08/23/22 08/23/23  Gildardo Pounds, NP  Omega-3 Fatty Acids (FISH OIL) 1000 MG CPDR Take 3 g by mouth daily. 08/23/22   Gildardo Pounds, NP  TRUEplus Lancets 28G MISC USE AS DIRECTED 2 (TWO) TIMES DAILY. 05/03/22   Charlott Rakes, MD      Allergies    Patient has no known allergies.    Review of Systems   Review of Systems  Constitutional:  Negative for chills and fever.  Respiratory:  Positive for shortness of breath.   Cardiovascular:  Negative for chest pain.  Gastrointestinal:  Negative for abdominal pain.  Neurological:  Negative for headaches.  Psychiatric/Behavioral:  The patient is nervous/anxious.     Physical Exam Updated Vital Signs BP 130/82   Pulse 89   Temp 98 F (36.7 C) (Oral)   Resp 16   SpO2 100%  Physical Exam Vitals and nursing note reviewed.  Constitutional:      General: She is not in acute distress.    Appearance: She is not ill-appearing.  HENT:     Head: Normocephalic and atraumatic.     Nose: No congestion.  Eyes:     Conjunctiva/sclera: Conjunctivae normal.  Cardiovascular:     Rate and  Rhythm: Normal rate and regular rhythm.     Pulses: Normal pulses.     Heart sounds: No murmur heard.    No friction rub. No gallop.  Pulmonary:     Effort: No respiratory distress.     Breath sounds: No wheezing, rhonchi or rales.  Musculoskeletal:     Right lower leg: No edema.     Left lower leg: No edema.     Comments: No unilateral leg swelling, no calf tenderness, no palpable cords  Skin:    General: Skin is warm and dry.  Neurological:     Mental Status: She is alert.  Psychiatric:        Mood and Affect: Mood normal.     ED Results / Procedures / Treatments   Labs (all labs ordered are listed, but only abnormal  results are displayed) Labs Reviewed  BASIC METABOLIC PANEL - Abnormal; Notable for the following components:      Result Value   CO2 21 (*)    Glucose, Bld 122 (*)    All other components within normal limits  CBC WITH DIFFERENTIAL/PLATELET  TROPONIN I (HIGH SENSITIVITY)  TROPONIN I (HIGH SENSITIVITY)    EKG EKG Interpretation  Date/Time:  Sunday September 03 2022 23:56:08 EST Ventricular Rate:  85 PR Interval:  156 QRS Duration: 74 QT Interval:  368 QTC Calculation: 437 R Axis:   10 Text Interpretation: Normal sinus rhythm Cannot rule out Anterior infarct , age undetermined Abnormal ECG When compared with ECG of 02-Nov-2020 12:33, No significant change was found Confirmed by Delora Fuel ([id number]) on 09/04/2022 1:48:05 AM  Radiology DG Elbow Complete Left  Result Date: 09/04/2022 CLINICAL DATA:  Chest pain and anxiety. EXAM: LEFT ELBOW - COMPLETE 3+ VIEW COMPARISON:  None Available. FINDINGS: There is no evidence of fracture, dislocation, or joint effusion. There is no evidence of arthropathy or other focal bone abnormality. Soft tissues are unremarkable. IMPRESSION: Negative. Electronically Signed   By: Jorje Guild M.D.   On: 09/04/2022 04:43   DG Chest 2 View  Result Date: 09/04/2022 CLINICAL DATA:  Chest pain and anxiety EXAM: CHEST - 2 VIEW COMPARISON:  None Available. FINDINGS: Normal heart size and mediastinal contours. No acute infiltrate or edema. No effusion or pneumothorax. No acute osseous findings. IMPRESSION: Negative chest. Electronically Signed   By: Jorje Guild M.D.   On: 09/04/2022 04:42    Procedures Procedures    Medications Ordered in ED Medications - No data to display  ED Course/ Medical Decision Making/ A&P                           Medical Decision Making Amount and/or Complexity of Data Reviewed Radiology: ordered.   This patient presents to the ED for concern of anxiety, this involves an extensive number of treatment options, and is a  complaint that carries with it a high risk of complications and morbidity.  The differential diagnosis includes PE, ACS, metabolic derailment, psychiatric emergency    Additional history obtained:  Additional history obtained from N/A External records from outside source obtained and reviewed including PCP notes   Co morbidities that complicate the patient evaluation  Diabetes, hypertension  Social Determinants of Health:  None English speaking    Lab Tests:  I Ordered, and personally interpreted labs.  The pertinent results include: CBC unremarkable, BMP shows CO2 of 21, glucose 122,   Imaging Studies ordered:  I ordered imaging studies including chest  x-ray, left elbow x-ray I independently visualized and interpreted imaging which showed both negative acute findings I agree with the radiologist interpretation   Cardiac Monitoring:  The patient was maintained on a cardiac monitor.  I personally viewed and interpreted the cardiac monitored which showed an underlying rhythm of: Unremarkable   Medicines ordered and prescription drug management:  I ordered medication including N/A I have reviewed the patients home medicines and have made adjustments as needed  Critical Interventions:  N/A   Reevaluation:  Triage obtain basic lab work-up, will add on a troponin, obtain chest x-ray for further evaluation of the thumping sensation in her chest, will also add on a left elbow x-ray she dorsum elbow pain.    Consultations Obtained:  N/A   Test Considered:  N/A    Rule out I have low suspicion for ACS as history is atypical, patient has no cardiac history, EKG was sinus rhythm without signs of ischemia, troponins are pending. low suspicion for PE as patient denies pleuritic chest pain, shortness of breath, patient denies leg pain, no pedal edema noted on exam, patient was PERC negative.  Low suspicion for AAA or aortic dissection as history is atypical, patient  has low risk factors.  Suspicion for psychiatric emergency is low at this time not endorsing any suicidal homicidal ideations does not respond to internal stimuli. Low suspicion for fracture or dislocation as x-ray does not feel any significant findings.  Low suspicion for compartment syndrome as area was palpated it was soft to the touch, neurovascular fully intact.     Dispostion and problem list  Due to shift change patient be handed off to [facility], PA-C  Follow-up on second troponin and if unremarkable patient can be discharged home.            Final Clinical Impression(s) / ED Diagnoses Final diagnoses:  Panic attack    Rx / DC Orders ED Discharge Orders     None         Marcello Fennel, PA-C 72/07/21 [id number]    Delora Fuel, MD 33/74/45 [phone]

## 2022-09-04 NOTE — Discharge Instructions (Addendum)
Lab work and imaging are unremarkable I suspect this is likely from your anxiety please continue with your Lexapro.  Recommend follow-up with your primary care doctor as needed  Come back to the emergency department if you develop chest pain, shortness of breath, severe abdominal pain, uncontrolled nausea, vomiting, diarrhea.

## 2022-09-19 ENCOUNTER — Other Ambulatory Visit: Payer: Self-pay | Admitting: Physician Assistant

## 2022-09-19 DIAGNOSIS — D508 Other iron deficiency anemias: Secondary | ICD-10-CM

## 2022-09-20 ENCOUNTER — Inpatient Hospital Stay: Payer: Self-pay | Attending: Physician Assistant

## 2022-09-20 ENCOUNTER — Inpatient Hospital Stay (HOSPITAL_BASED_OUTPATIENT_CLINIC_OR_DEPARTMENT_OTHER): Payer: Self-pay | Admitting: Physician Assistant

## 2022-09-20 VITALS — HR 79 | Temp 97.7°F | Resp 18 | Wt 176.5 lb

## 2022-09-20 DIAGNOSIS — D508 Other iron deficiency anemias: Secondary | ICD-10-CM

## 2022-09-20 DIAGNOSIS — D509 Iron deficiency anemia, unspecified: Secondary | ICD-10-CM | POA: Insufficient documentation

## 2022-09-20 LAB — CMP (CANCER CENTER ONLY)
ALT: 13 U/L (ref 0–44)
AST: 11 U/L — ABNORMAL LOW (ref 15–41)
Albumin: 4.2 g/dL (ref 3.5–5.0)
Alkaline Phosphatase: 55 U/L (ref 38–126)
Anion gap: 4 — ABNORMAL LOW (ref 5–15)
BUN: 16 mg/dL (ref 6–20)
CO2: 27 mmol/L (ref 22–32)
Calcium: 9.6 mg/dL (ref 8.9–10.3)
Chloride: 105 mmol/L (ref 98–111)
Creatinine: 0.73 mg/dL (ref 0.44–1.00)
GFR, Estimated: >60 mL/min (ref >60)
Glucose, Bld: 79 mg/dL (ref 70–99)
Potassium: 4.2 mmol/L (ref 3.5–5.1)
Sodium: 136 mmol/L (ref 135–145)
Total Bilirubin: 0.3 mg/dL (ref 0.3–1.2)
Total Protein: 7.6 g/dL (ref 6.5–8.1)

## 2022-09-20 LAB — CBC WITH DIFFERENTIAL (CANCER CENTER ONLY)
Abs Immature Granulocytes: 0.02 10*3/uL (ref 0.00–0.07)
Basophils Absolute: 0 10*3/uL (ref 0.0–0.1)
Basophils Relative: 1 %
Eosinophils Absolute: 0.2 10*3/uL (ref 0.0–0.5)
Eosinophils Relative: 2 %
HCT: 34.9 % — ABNORMAL LOW (ref 36.0–46.0)
Hemoglobin: 11.8 g/dL — ABNORMAL LOW (ref 12.0–15.0)
Immature Granulocytes: 0 %
Lymphocytes Relative: 23 %
Lymphs Abs: 1.5 10*3/uL (ref 0.7–4.0)
MCH: 30.6 pg (ref 26.0–34.0)
MCHC: 33.8 g/dL (ref 30.0–36.0)
MCV: 90.4 fL (ref 80.0–100.0)
Monocytes Absolute: 0.3 10*3/uL (ref 0.1–1.0)
Monocytes Relative: 5 %
Neutro Abs: 4.5 10*3/uL (ref 1.7–7.7)
Neutrophils Relative %: 69 %
Platelet Count: 238 10*3/uL (ref 150–400)
RBC: 3.86 MIL/uL — ABNORMAL LOW (ref 3.87–5.11)
RDW: 13.6 % (ref 11.5–15.5)
WBC Count: 6.4 10*3/uL (ref 4.0–10.5)
nRBC: 0 % (ref 0.0–0.2)

## 2022-09-20 LAB — FERRITIN: Ferritin: 119 ng/mL (ref 11–307)

## 2022-09-20 LAB — IRON AND IRON BINDING CAPACITY (CC-WL,HP ONLY)
Iron: 61 ug/dL (ref 28–170)
Saturation Ratios: 20 % (ref 10.4–31.8)
TIBC: 312 ug/dL (ref 250–450)
UIBC: 251 ug/dL (ref 148–442)

## 2022-09-20 NOTE — Progress Notes (Signed)
[facility] [phone]   [phone]  PROGRESS NOTE  Patient Care Team: Gildardo Pounds, NP as PCP - General (Nurse Practitioner)  CHIEF COMPLAINTS/PURPOSE OF CONSULTATION:  Iron deficiency anemia  TREATMENT HISTORY: --Currently on ferrous sulfate 325 mg once daily --Received IV monoferric 1000 mg x 1 dose on 07/10/2022   HISTORY OF PRESENTING ILLNESS:  [name] 51 y.o. female returns for a follow up visit for iron deficiency anemia.  She is accompanied by Almyra Free, [place] translator. She was last seen on 06/21/2022 to establish care. In the interim, she received IV monoferric on 07/10/2022.   On exam today, Ms. Marlowe Sax reports her energy levels are good and denies any fatigue at this time. Her appetite and weight are stable. She denies nausea, vomiting or abdominal pain. Her bowel habits are unchanged without any recurrent episodes of diarrhea or constipation. She reports her menstrual cycles are last longer, up to 10 days with each cycle. She was evaluated by OB/GYN and told no further follow up is required. She is not on any hormonal therapy at this time.  She has no other signs of bleeding including hematochezia or melena.  Patient denies fevers, chills, night sweats, shortness of breath, chest pain or dyspnea complaints.  Rest of ROS is below.  MEDICAL HISTORY:  Past Medical History:  Diagnosis Date   AMA (advanced maternal age) multigravida 35+    Anemia    Diabetes mellitus without complication ([place])    Ganglion cyst 01/25/2017   Left ankle   Kidney stones 2013   With pregnancy    Language barrier    Obese    Obesity 01/25/2017   Pregnancy induced hypertension    at end of last 2 pregnancies    SURGICAL HISTORY: Past Surgical History:  Procedure Laterality Date   NO PAST SURGERIES      SOCIAL HISTORY: Social History   Socioeconomic History   Marital status: Single    Spouse name: Not on file   Number of children: 3    Years of education: <8th grade   Highest education level: 6th grade  Occupational History   Occupation: Airline pilot: [employer]  Tobacco Use   Smoking status: Never   Smokeless tobacco: Never  Vaping Use   Vaping Use: Never used  Substance and Sexual Activity   Alcohol use: No    Alcohol/week: 0.0 standard drinks of alcohol   Drug use: No   Sexual activity: Yes    Birth control/protection: Condom, None    Comment: No current boyfriend or spouse-not interested  Other Topics Concern   Not on file  Social History Narrative   Not on file   Social Determinants of Health   Financial Resource Strain: Not on file  Food Insecurity: Food Insecurity Present (06/15/2022)   Hunger Vital Sign    Worried About Running Out of Food in the Last Year: Sometimes true    Ran Out of Food in the Last Year: Sometimes true  Transportation Needs: No Transportation Needs (06/15/2022)   PRAPARE - Hydrologist (Medical): No    Lack of Transportation (Non-Medical): No  Physical Activity: Not on file  Stress: Not on file  Social Connections: Not on file  Intimate Partner Violence: Not on file    FAMILY HISTORY: Family History  Problem Relation Age of Onset   Diabetes Father    Hypertension Father    Breast cancer Neg Hx  ALLERGIES:  has No Known Allergies.  MEDICATIONS:  Current Outpatient Medications  Medication Sig Dispense Refill   Blood Glucose Monitoring Suppl (TRUE METRIX METER) w/Device KIT 1 each by Does not apply route 2 (two) times daily. 1 kit 0   diclofenac Sodium (VOLTAREN) 1 % GEL Apply 2 g topically to left elbow 4 (four) times daily as needed. 200 g 1   ferrous sulfate (FEROSUL) 325 (65 FE) MG tablet Take 1 tablet (325 mg total) by mouth daily with breakfast. 90 tablet 2   glucose blood (TRUE METRIX BLOOD GLUCOSE TEST) test strip Use as instructed 100 each 12   lisinopril (ZESTRIL) 5 MG tablet Take 1 tablet (5 mg total) by mouth daily. 90  tablet 1   metFORMIN (GLUCOPHAGE) 500 MG tablet Take 1 tablet (500 mg total) by mouth 2 (two) times daily with a meal. 180 tablet 1   TRUEplus Lancets 28G MISC USE AS DIRECTED 2 (TWO) TIMES DAILY. 100 each 0   betamethasone dipropionate 0.05 % cream APPLY TOPICALLY 2 (TWO) TIMES DAILY. (Patient not taking: Reported on 09/20/2022) 60 g 1   Omega-3 Fatty Acids (FISH OIL) 1000 MG CPDR Take 3 g by mouth daily. (Patient not taking: Reported on 09/20/2022) 90 capsule 5   No current facility-administered medications for this visit.    REVIEW OF SYSTEMS:   Constitutional: ( - ) fevers, ( - )  chills , ( - ) night sweats Eyes: ( - ) blurriness of vision, ( - ) double vision, ( - ) watery eyes Ears, nose, mouth, throat, and face: ( - ) mucositis, ( - ) sore throat Respiratory: ( - ) cough, ( - ) dyspnea, ( - ) wheezes Cardiovascular: ( - ) palpitation, ( - ) chest discomfort, ( - ) lower extremity swelling Gastrointestinal:  ( - ) nausea, ( - ) heartburn, ( - ) change in bowel habits Skin: ( - ) abnormal skin rashes Lymphatics: ( - ) new lymphadenopathy, ( - ) easy bruising Neurological: ( - ) numbness, ( - ) tingling, ( - ) new weaknesses Behavioral/Psych: ( - ) mood change, ( - ) new changes  All other systems were reviewed with the patient and are negative.  PHYSICAL EXAMINATION: ECOG PERFORMANCE STATUS: 0 - Asymptomatic  Vitals:   09/20/22 0950  Pulse: 79  Resp: 18  Temp: 97.7 F (36.5 C)  SpO2: 100%   Filed Weights   09/20/22 0950  Weight: 176 lb 8 oz (80.1 kg)    GENERAL: well appearing female in NAD  SKIN: skin color, texture, turgor are normal, no rashes or significant lesions EYES: conjunctiva are pink and non-injected, sclera clear OROPHARYNX: no exudate, no erythema; lips, buccal mucosa, and tongue normal  LUNGS: clear to auscultation and percussion with normal breathing effort HEART: regular rate & rhythm and no murmurs and no lower extremity edema Musculoskeletal: no  cyanosis of digits and no clubbing  PSYCH: alert & oriented x 3, fluent speech NEURO: no focal motor/sensory deficits  LABORATORY DATA:  I have reviewed the data as listed    Latest Ref Rng & Units 09/20/2022    9:28 AM 09/04/2022   12:09 AM 06/21/2022    2:53 PM  CBC  WBC 4.0 - 10.5 K/uL 6.4  9.1  7.9   Hemoglobin 12.0 - 15.0 g/dL 11.8  12.5  10.6   Hematocrit 36.0 - 46.0 % 34.9  36.7  31.4   Platelets 150 - 400 K/uL 238  262  253  Latest Ref Rng & Units 09/20/2022    9:28 AM 09/04/2022   12:09 AM 06/21/2022    2:53 PM  CMP  Glucose 70 - 99 mg/dL 79  122  101   BUN 6 - 20 mg/dL _0 Creatinine 0.44 - 1.00 mg/dL 0.73  0.87  0.81   Sodium 135 - 145 mmol/L 136  138  139   Potassium 3.5 - 5.1 mmol/L 4.2  4.1  4.0   Chloride 98 - 111 mmol/L 105  105  108   CO2 22 - 32 mmol/L _1 Calcium 8.9 - 10.3 mg/dL 9.6  9.2  9.2   Total Protein 6.5 - 8.1 g/dL 7.6   7.7   Total Bilirubin 0.3 - 1.2 mg/dL 0.3   0.4   Alkaline Phos 38 - 126 U/L 55   45   AST 15 - 41 U/L 11   16   ALT 0 - 44 U/L 13   17     RADIOGRAPHIC STUDIES: I have personally reviewed the radiological images as listed and agreed with the findings in the report. DG Elbow Complete Left  Result Date: 09/04/2022 CLINICAL DATA:  Chest pain and anxiety. EXAM: LEFT ELBOW - COMPLETE 3+ VIEW COMPARISON:  None Available. FINDINGS: There is no evidence of fracture, dislocation, or joint effusion. There is no evidence of arthropathy or other focal bone abnormality. Soft tissues are unremarkable. IMPRESSION: Negative. Electronically Signed   By: Jorje Guild M.D.   On: 09/04/2022 04:43   DG Chest 2 View  Result Date: 09/04/2022 CLINICAL DATA:  Chest pain and anxiety EXAM: CHEST - 2 VIEW COMPARISON:  None Available. FINDINGS: Normal heart size and mediastinal contours. No acute infiltrate or edema. No effusion or pneumothorax. No acute osseous findings. IMPRESSION: Negative chest. Electronically Signed   By:  Jorje Guild M.D.   On: 09/04/2022 04:42    ASSESSMENT & PLAN Kiylee Thoreson is a 51 y.o. female returns for a follow up for iron deficiency anemia.   #Iron deficiency anemia:  --Currently on ferrous sulfate 325 mg once a day. --Received IV monoferric 1000 mg x 1 dose on 07/10/2022 --Labs today show improvement of Hgb to 11.8, MCV 90.4. Iron panel shows no evidence of deficiency. --No need for additional IV iron at this time --RTC in 3 months for lab only check and 6 months with labs/follow up  No orders of the defined types were placed in this encounter.   All questions were answered. The patient knows to call the clinic with any problems, questions or concerns.  I have spent a total of 25 minutes minutes of face-to-face and non-face-to-face time, preparing to see the [place] a medically appropriate examination, counseling and educating the patient, documenting clinical information in the electronic health record and care coordination.   Dede Query, PA-C Department of Hematology/Oncology [place] [facility] Phone: [phone]

## 2022-09-21 ENCOUNTER — Telehealth: Payer: Self-pay

## 2022-09-21 NOTE — Telephone Encounter (Signed)
-----   Message from Briant Cedar, PA-C sent at 09/20/2022  4:59 PM EST ----- Please let patient know that iron levels are normal. No need for additional IV iron at this time. We will see her back in 3 months for lab only check and 6 months for follow up visit.

## 2022-09-21 NOTE — Telephone Encounter (Signed)
Pt advised through interpreter, Raynelle Fanning, of lab results, and next appts.

## 2022-10-23 ENCOUNTER — Other Ambulatory Visit: Payer: Self-pay

## 2022-10-23 ENCOUNTER — Encounter: Payer: Self-pay | Admitting: Nurse Practitioner

## 2022-10-23 ENCOUNTER — Encounter: Payer: Self-pay | Admitting: Physician Assistant

## 2022-10-23 ENCOUNTER — Other Ambulatory Visit: Payer: Self-pay | Admitting: Nurse Practitioner

## 2022-10-23 ENCOUNTER — Ambulatory Visit: Payer: Self-pay | Attending: Nurse Practitioner | Admitting: Nurse Practitioner

## 2022-10-23 VITALS — BP 119/74 | HR 85 | Ht 59.0 in | Wt 175.0 lb

## 2022-10-23 DIAGNOSIS — M25522 Pain in left elbow: Secondary | ICD-10-CM

## 2022-10-23 DIAGNOSIS — Z23 Encounter for immunization: Secondary | ICD-10-CM

## 2022-10-23 DIAGNOSIS — E1165 Type 2 diabetes mellitus with hyperglycemia: Secondary | ICD-10-CM

## 2022-10-23 MED ORDER — TRUE METRIX BLOOD GLUCOSE TEST VI STRP
ORAL_STRIP | 12 refills | Status: DC
  Filled 2022-10-23: qty 100, 25d supply, fill #0
  Filled 2023-01-31: qty 100, 25d supply, fill #1
  Filled 2023-04-03 – 2023-04-04 (×2): qty 100, 25d supply, fill #2
  Filled 2023-07-18: qty 100, 25d supply, fill #3
  Filled 2023-10-03: qty 100, 25d supply, fill #4

## 2022-10-23 MED ORDER — TRUEPLUS LANCETS 28G MISC
12 refills | Status: AC
  Filled 2022-10-23: qty 100, 25d supply, fill #0

## 2022-10-23 NOTE — Progress Notes (Signed)
Assessment & Plan:  Gigi was seen today for elbow pain.  Diagnoses and all orders for this visit:  Left elbow pain  Need for immunization against influenza -     Flu Vaccine QUAD 72mo+IM (Fluarix, Fluzone & Alfiuria Quad PF)    Patient has been counseled on age-appropriate routine health concerns for screening and prevention. These are reviewed and up-to-date. Referrals have been placed accordingly. Immunizations are up-to-date or declined.    Subjective:   Chief Complaint  Patient presents with   Elbow Pain   HPI Noheli Melder 52 y.o. female presents to office today   Elbow Pain: Patient complains of left elbow pain. Onset of the symptoms was several weeks ago. Inciting event: none known. Current symptoms include point tenderness in the lateral epicondyle . Pain is aggravated by: lifting heavy objects, direct pressure . Patient's overall course: well controlled. Patient has had no prior elbow problems. Evaluation to date: none. Treatment to date: copper sleeve, diclofenac gel, tylenol    BP Readings from Last 3 Encounters:  10/23/22 119/74  09/04/22 118/68  08/23/22 118/80     Review of Systems  Constitutional:  Negative for fever, malaise/fatigue and weight loss.  HENT: Negative.  Negative for nosebleeds.   Eyes: Negative.  Negative for blurred vision, double vision and photophobia.  Respiratory: Negative.  Negative for cough and shortness of breath.   Cardiovascular: Negative.  Negative for chest pain, palpitations and leg swelling.  Gastrointestinal: Negative.  Negative for heartburn, nausea and vomiting.  Musculoskeletal:  Positive for joint pain. Negative for back pain and myalgias.  Neurological: Negative.  Negative for dizziness, focal weakness, seizures and headaches.  Psychiatric/Behavioral: Negative.  Negative for suicidal ideas.     Past Medical History:  Diagnosis Date   AMA (advanced maternal age) multigravida 35+    Anemia    Diabetes  mellitus without complication ([place])    Ganglion cyst 01/25/2017   Left ankle   Kidney stones 2013   With pregnancy    Language barrier    Obese    Obesity 01/25/2017   Pregnancy induced hypertension    at end of last 2 pregnancies    Past Surgical History:  Procedure Laterality Date   NO PAST SURGERIES      Family History  Problem Relation Age of Onset   Diabetes Father    Hypertension Father    Breast cancer Neg Hx     Social History Reviewed with no changes to be made today.   Outpatient Medications Prior to Visit  Medication Sig Dispense Refill   betamethasone dipropionate 0.05 % cream APPLY TOPICALLY 2 (TWO) TIMES DAILY. 60 g 1   ferrous sulfate (FEROSUL) 325 (65 FE) MG tablet Take 1 tablet (325 mg total) by mouth daily with breakfast. 90 tablet 2   lisinopril (ZESTRIL) 5 MG tablet Take 1 tablet (5 mg total) by mouth daily. 90 tablet 1   metFORMIN (GLUCOPHAGE) 500 MG tablet Take 1 tablet (500 mg total) by mouth 2 (two) times daily with a meal. 180 tablet 1   Blood Glucose Monitoring Suppl (TRUE METRIX METER) w/Device KIT 1 each by Does not apply route 2 (two) times daily. (Patient not taking: Reported on 10/23/2022) 1 kit 0   glucose blood (TRUE METRIX BLOOD GLUCOSE TEST) test strip Use as instructed (Patient not taking: Reported on 10/23/2022) 100 each 12   Omega-3 Fatty Acids (FISH OIL) 1000 MG CPDR Take 3 g by mouth daily. (Patient not taking: Reported on  09/20/2022) 90 capsule 5   TRUEplus Lancets 28G MISC USE AS DIRECTED 2 (TWO) TIMES DAILY. (Patient not taking: Reported on 10/23/2022) 100 each 0   No facility-administered medications prior to visit.    No Known Allergies     Objective:    BP 119/74   Pulse 85   Ht 4\' 11"  (1.499 m)   Wt 175 lb (79.4 kg)   LMP 10/03/2022 (Exact Date)   SpO2 100%   BMI 35.35 kg/m  Wt Readings from Last 3 Encounters:  10/23/22 175 lb (79.4 kg)  09/20/22 176 lb 8 oz (80.1 kg)  08/23/22 178 lb (80.7 kg)    Physical Exam Vitals  and nursing note reviewed.  Constitutional:      Appearance: She is well-developed.  HENT:     Head: Normocephalic and atraumatic.  Cardiovascular:     Rate and Rhythm: Normal rate and regular rhythm.     Heart sounds: Normal heart sounds. No murmur heard.    No friction rub. No gallop.  Pulmonary:     Effort: Pulmonary effort is normal. No tachypnea or respiratory distress.     Breath sounds: Normal breath sounds. No decreased breath sounds, wheezing, rhonchi or rales.  Chest:     Chest wall: No tenderness.  Abdominal:     General: Bowel sounds are normal.     Palpations: Abdomen is soft.  Musculoskeletal:        General: Normal range of motion.     Left elbow: No swelling, deformity, effusion or lacerations. Normal range of motion. Tenderness present in lateral epicondyle.     Cervical back: Normal range of motion.  Skin:    General: Skin is warm and dry.  Neurological:     Mental Status: She is alert and oriented to person, place, and time.     Coordination: Coordination normal.  Psychiatric:        Behavior: Behavior normal. Behavior is cooperative.        Thought Content: Thought content normal.        Judgment: Judgment normal.          Patient has been counseled extensively about nutrition and exercise as well as the importance of adherence with medications and regular follow-up. The patient was given clear instructions to go to ER or return to medical center if symptoms don't improve, worsen or new problems develop. The patient verbalized understanding.   Follow-up: Return in 19 weeks (on 03/05/2023) for prediabetes.   03/07/2023, FNP-BC [facility] [place], [place]   10/23/2022, 11:55 AM

## 2022-10-26 ENCOUNTER — Other Ambulatory Visit: Payer: Self-pay

## 2022-11-08 ENCOUNTER — Telehealth: Payer: Self-pay

## 2022-11-08 NOTE — Telephone Encounter (Signed)
Patient states she has been approved for the orange card and would like a referral to be sent in for her elbow pain possibly.

## 2022-11-10 ENCOUNTER — Other Ambulatory Visit: Payer: Self-pay | Admitting: Nurse Practitioner

## 2022-11-10 DIAGNOSIS — M25522 Pain in left elbow: Secondary | ICD-10-CM

## 2022-11-27 ENCOUNTER — Ambulatory Visit: Payer: Self-pay | Attending: Nurse Practitioner | Admitting: Physical Therapy

## 2022-11-27 ENCOUNTER — Other Ambulatory Visit: Payer: Self-pay

## 2022-11-27 ENCOUNTER — Encounter: Payer: Self-pay | Admitting: Physical Therapy

## 2022-11-27 DIAGNOSIS — M6281 Muscle weakness (generalized): Secondary | ICD-10-CM | POA: Insufficient documentation

## 2022-11-27 DIAGNOSIS — M25522 Pain in left elbow: Secondary | ICD-10-CM | POA: Insufficient documentation

## 2022-11-27 NOTE — Patient Instructions (Signed)
Access Code: [email or web address] URL: [email or web address] Date: 11/27/2022 Prepared by: Hilda Blades  Exercises - Seated Wrist Extension with Anchored Resistance  - 2 x daily - 2 sets - 10 reps - Seated Single Arm Elbow Flexion with Resistance  - 2 x daily - 2 sets - 10 reps - Forearm Supination with Resistance  - 2 x daily - 2 sets - 10 reps

## 2022-11-27 NOTE — Therapy (Signed)
OUTPATIENT PHYSICAL THERAPY EVALUATION   Patient Name: Rebecca Sharp MRN: [id number] DOB:18-Oct-1970, 52 y.o., female Today's Date: 11/27/2022   END OF SESSION:  PT End of Session - 11/27/22 0944     Visit Number 1    Number of Visits 9    Date for PT Re-Evaluation 01/22/23    Authorization Type CAFA    PT Start Time 0930    PT Stop Time [id number]    PT Time Calculation (min) 45 min    Activity Tolerance Patient tolerated treatment well    Behavior During Therapy [facility] for tasks assessed/performed             Past Medical History:  Diagnosis Date   AMA (advanced maternal age) multigravida 35+    Anemia    Diabetes mellitus without complication ([place])    Ganglion cyst 01/25/2017   Left ankle   Kidney stones 2013   With pregnancy    Language barrier    Obese    Obesity 01/25/2017   Pregnancy induced hypertension    at end of last 2 pregnancies   Past Surgical History:  Procedure Laterality Date   NO PAST SURGERIES     Patient Active Problem List   Diagnosis Date Noted   Iron deficiency anemia 06/28/2022   Normocytic anemia 06/21/2022   Neck and shoulder pain 03/03/2021   GAD (generalized anxiety disorder) 03/03/2021   Stress 03/03/2021   Gastroesophageal reflux disease without esophagitis 03/03/2021   Class 2 obesity due to excess calories with body mass index (BMI) of 36.0 to 36.9 in adult 01/25/2017   Ganglion cyst 01/25/2017   Prediabetes 02/17/2016   Essential hypertension 08/19/2015   Kidney stones     PCP: Gildardo Pounds, NP  REFERRING PROVIDER: Gildardo Pounds, NP  REFERRING DIAG: Left elbow pain   THERAPY DIAG:  Pain in left elbow  Muscle weakness (generalized)  Rationale for Evaluation and Treatment: Rehabilitation  ONSET DATE: about 7-8 months   SUBJECTIVE:           In-person interpreter utilized throughout session SUBJECTIVE STATEMENT: Patient reports left elbow pain since June or July 2023, she states that it started hurting  her after she saw the chiropractor. She states that lifting something heavy will aggravate the left elbow. She states that is she doesn't move her arm it doesn't hurt, but if she move the elbow it hurts. Will occasionally have some clicking and popping in the elbow.  PERTINENT HISTORY: See PMH above  PAIN:  Are you having pain? Yes:  NPRS scale: 6/10 Pain location: left elbow Pain description: "feels like its the bone or joint" Aggravating factors: Moving the elbow Relieving factors: Rest  PRECAUTIONS: None  WEIGHT BEARING RESTRICTIONS: No  FALLS:  Has patient fallen in last 6 months? No  OCCUPATION: Patient works at [employer] with moving the left arm   OBJECTIVE:  DIAGNOSTIC FINDINGS:  Negative  PATIENT SURVEYS:  Quick Dash 18.2% functional status  COGNITION: Overall cognitive status: Within functional limits for tasks assessed     SENSATION: WFL  POSTURE: Rounded shoulder posture  UPPER EXTREMITY ROM:   Active ROM Right eval Left eval  Shoulder flexion    Shoulder extension    Shoulder abduction    Shoulder adduction    Shoulder internal rotation    Shoulder external rotation    Elbow flexion 145 145  Elbow extension 0 0  Wrist flexion    Wrist extension  Wrist ulnar deviation    Wrist radial deviation    Wrist pronation    Wrist supination    (Blank rows = not tested)  UPPER EXTREMITY MMT:  MMT Right eval Left eval  Shoulder flexion 5 5  Shoulder extension    Shoulder abduction    Shoulder adduction    Shoulder internal rotation    Shoulder external rotation    Middle trapezius    Lower trapezius    Elbow flexion 5 5  Elbow extension 5 4+  Wrist flexion 5 5  Wrist extension 5 4  Wrist ulnar deviation    Wrist radial deviation    Wrist pronation 5 5  Wrist supination 5 4  Grip strength (lbs) 55 54  (Blank rows = not tested)  JOINT MOBILITY TESTING:  WFL  PALPATION:  Tender  to palpation at extensor mass tendon and insertion to humerus    TODAY'S TREATMENT:          [facility] Adult PT Treatment:                                                DATE: 11/27/2022 Therapeutic Exercise: Wrist extension with green x 10 Bicep curl in pronation with green x 10 Supination with green x 10  PATIENT EDUCATION: Education details: Exam findings, POC, HEP Person educated: Patient Education method: Explanation, Demonstration, Tactile cues, Verbal cues, and Handouts Education comprehension: verbalized understanding, returned demonstration, verbal cues required, tactile cues required, and needs further education  HOME EXERCISE PROGRAM: Access Code: [email or web address]   ASSESSMENT: CLINICAL IMPRESSION: Patient is a 52 y.o. female who was seen today for physical therapy evaluation and treatment for chronic left elbow pain. 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 She exhibits good motion of the elbow with limitations of wrist and elbow strength.   OBJECTIVE IMPAIRMENTS: decreased activity tolerance, decreased strength, postural dysfunction, and pain.   ACTIVITY LIMITATIONS: carrying and lifting  PARTICIPATION LIMITATIONS: meal prep, cleaning, and occupation  PERSONAL FACTORS: Fitness, Past/current experiences, and Time since onset of injury/illness/exacerbation are also affecting patient's functional outcome.   REHAB POTENTIAL: Good  CLINICAL DECISION MAKING: Stable/uncomplicated  EVALUATION COMPLEXITY: Low   GOALS: Goals reviewed with patient? Yes  SHORT TERM GOALS: Target date: 12/25/2022  Patient will be I with initial HEP in order to progress with therapy. Baseline: HEP provided at eval Goal status: INITIAL  4.  Patient will report </= 4/10 pain with left elbow movement and lifting to reduce functional limitations Baseline: 6/10 Goal status: INITIAL  LONG TERM GOALS: Target date: 01/22/2023  Patient will be I with final HEP to maintain progress from  PT. Baseline: HEP provided at eval Goal status: INITIAL  2.  Patient will report QuickDASH </= 10% in order to indicate improved functional ability Baseline: 18.2% disability Goal status: INITIAL  3.  Patient will exhibit left elbow and wrist strength 5/5 MMT in order to improve lifting Baseline: see limitations above Goal status: INITIAL  4.  Patient will report </= 2/10 pain with left elbow movement and lifting to reduce functional limitations Baseline: 6/10 Goal status: INITIAL   PLAN: PT FREQUENCY: 1x/week  PT DURATION: 8 weeks  PLANNED INTERVENTIONS: Therapeutic exercises, Therapeutic activity, Neuromuscular re-education, Balance training, Gait training, Patient/Family education, Self Care, Joint mobilization, Joint manipulation, Aquatic Therapy, Dry Needling, Electrical stimulation, Cryotherapy, Moist heat, Taping, Ultrasound, Ionotophoresis  5m/ml Dexamethasone, Manual therapy, and Re-evaluation  PLAN FOR NEXT SESSION: Review HEP and progress PRN, manual for extensor mass, progress elbow and wrist strengthening and postural control   CHilda Blades PT, DPT, LAT, ATC 11/27/22  11:14 AM Phone: [phone]: [phone]

## 2022-12-04 ENCOUNTER — Encounter: Payer: Self-pay | Admitting: Physical Therapy

## 2022-12-04 ENCOUNTER — Ambulatory Visit: Payer: Self-pay | Admitting: Physical Therapy

## 2022-12-04 ENCOUNTER — Other Ambulatory Visit: Payer: Self-pay

## 2022-12-04 DIAGNOSIS — M6281 Muscle weakness (generalized): Secondary | ICD-10-CM

## 2022-12-04 DIAGNOSIS — M25522 Pain in left elbow: Secondary | ICD-10-CM

## 2022-12-04 NOTE — Therapy (Signed)
OUTPATIENT PHYSICAL THERAPY TREATMENT NOTE   Patient Name: Rebecca Sharp MRN: [id number] DOB:December 03, 1970, 52 y.o., female Today's Date: 12/04/2022   PCP: Gildardo Pounds, NP REFERRING PROVIDER: Gildardo Pounds, NP   END OF SESSION:   PT End of Session - 12/04/22 0848     Visit Number 2    Number of Visits 9    Date for PT Re-Evaluation 01/22/23    Authorization Type CAFA    PT Start Time 0845    PT Stop Time 0925    PT Time Calculation (min) 40 min    Activity Tolerance Patient tolerated treatment well    Behavior During Therapy [facility] for tasks assessed/performed             Past Medical History:  Diagnosis Date   AMA (advanced maternal age) multigravida 35+    Anemia    Diabetes mellitus without complication ([place])    Ganglion cyst 01/25/2017   Left ankle   Kidney stones 2013   With pregnancy    Language barrier    Obese    Obesity 01/25/2017   Pregnancy induced hypertension    at end of last 2 pregnancies   Past Surgical History:  Procedure Laterality Date   NO PAST SURGERIES     Patient Active Problem List   Diagnosis Date Noted   Iron deficiency anemia 06/28/2022   Normocytic anemia 06/21/2022   Neck and shoulder pain 03/03/2021   GAD (generalized anxiety disorder) 03/03/2021   Stress 03/03/2021   Gastroesophageal reflux disease without esophagitis 03/03/2021   Class 2 obesity due to excess calories with body mass index (BMI) of 36.0 to 36.9 in adult 01/25/2017   Ganglion cyst 01/25/2017   Prediabetes 02/17/2016   Essential hypertension 08/19/2015   Kidney stones     REFERRING DIAG: Left elbow pain    THERAPY DIAG:  Pain in left elbow  Muscle weakness (generalized)  Rationale for Evaluation and Treatment Rehabilitation  PERTINENT HISTORY: None   PRECAUTIONS: None    SUBJECTIVE:                 SUBJECTIVE STATEMENT:  Patient reports she is feeling about the same.  PAIN:  Are you having pain? Yes:  NPRS scale: 4-5/10 Pain  location: left elbow Pain description: "feels like its the bone or joint" Aggravating factors: Moving the elbow Relieving factors: Rest   OBJECTIVE: (objective measures completed at initial evaluation unless otherwise dated) PATIENT SURVEYS:  Quick Dash 18.2% functional status   POSTURE: Rounded shoulder posture   UPPER EXTREMITY ROM:    Active ROM Right eval Left eval  Shoulder flexion      Shoulder extension      Shoulder abduction      Shoulder adduction      Shoulder internal rotation      Shoulder external rotation      Elbow flexion 145 145  Elbow extension 0 0  Wrist flexion      Wrist extension      Wrist ulnar deviation      Wrist radial deviation      Wrist pronation      Wrist supination      (Blank rows = not tested)   UPPER EXTREMITY MMT:   MMT Right eval Left eval Left 12/04/22  Shoulder flexion 5 5   Shoulder extension       Shoulder abduction       Shoulder adduction       Shoulder internal  rotation       Shoulder external rotation       Middle trapezius       Lower trapezius       Elbow flexion 5 5   Elbow extension 5 4+   Wrist flexion 5 5   Wrist extension 5 4 4  $ Wrist ulnar deviation       Wrist radial deviation       Wrist pronation 5 5   Wrist supination 5 4   Grip strength (lbs) 55 54   (Blank rows = not tested)  PALPATION:  Tender to palpation at extensor mass tendon and insertion to humerus               TODAY'S TREATMENT:          [facility] Adult PT Treatment:                                                DATE: 12/04/2022 Therapeutic Exercise: UBE L1 x 4 min fwd while taking subjective Wrist extension with 3# 3 x 15 Pronation / supination with 2# 3 x 15 Bicep curl in supination, neutral and pronation with 5# 2 x 10 each Radial deviation with 2# 2 x 10 Velcro pronation / supination 2 x 3 Velcro flexion / extension 2 x 3 Overhead shoulder press with 3# 2 x 15 Shoulder flexion with 3# 2 x 10 Row with black 2 x 15 Extension  with green 2 x 15   [facility] Adult PT Treatment:                                                DATE: 11/27/2022 Therapeutic Exercise: Wrist extension with green x 10 Bicep curl in pronation with green x 10 Supination with green x 10   PATIENT EDUCATION: Education details: HEP Person educated: Patient Education method: Consulting civil engineer, Demonstration, Corporate treasurer cues, Verbal cues Education comprehension: verbalized understanding, returned demonstration, verbal cues required, tactile cues required, and needs further education   HOME EXERCISE PROGRAM: Access Code: [email or web address]     ASSESSMENT: CLINICAL IMPRESSION: Patient tolerated therapy well with no adverse effects. Therapy focused on progressing left elbow strength with good tolerance. She does continue to exhibit left elbow strength deficit and reports fatigue with exercises. No changes to HEP this visit. Patient would benefit from continued skilled PT to progress strength and reduce pain to maximize functional ability.     OBJECTIVE IMPAIRMENTS: decreased activity tolerance, decreased strength, postural dysfunction, and pain.    ACTIVITY LIMITATIONS: carrying and lifting   PARTICIPATION LIMITATIONS: meal prep, cleaning, and occupation   PERSONAL FACTORS: Fitness, Past/current experiences, and Time since onset of injury/illness/exacerbation are also affecting patient's functional outcome.      GOALS: Goals reviewed with patient? Yes   SHORT TERM GOALS: Target date: 12/25/2022   Patient will be I with initial HEP in order to progress with therapy. Baseline: HEP provided at eval Goal status: INITIAL   4.  Patient will report </= 4/10 pain with left elbow movement and lifting to reduce functional limitations Baseline: 6/10 Goal status: INITIAL   LONG TERM GOALS: Target date: 01/22/2023   Patient will be I with final HEP to maintain progress from PT. Baseline: HEP  provided at eval Goal status: INITIAL   2.  Patient will report QuickDASH  </= 10% in order to indicate improved functional ability Baseline: 18.2% disability Goal status: INITIAL   3.  Patient will exhibit left elbow and wrist strength 5/5 MMT in order to improve lifting Baseline: see limitations above Goal status: INITIAL   4.  Patient will report </= 2/10 pain with left elbow movement and lifting to reduce functional limitations Baseline: 6/10 Goal status: INITIAL     PLAN: PT FREQUENCY: 1x/week   PT DURATION: 8 weeks   PLANNED INTERVENTIONS: Therapeutic exercises, Therapeutic activity, Neuromuscular re-education, Balance training, Gait training, Patient/Family education, Self Care, Joint mobilization, Joint manipulation, Aquatic Therapy, Dry Needling, Electrical stimulation, Cryotherapy, Moist heat, Taping, Ultrasound, Ionotophoresis 2m/ml Dexamethasone, Manual therapy, and Re-evaluation   PLAN FOR NEXT SESSION: Review HEP and progress PRN, manual for extensor mass, progress elbow and wrist strengthening and postural control   CHilda Blades PT, DPT, LAT, ATC 12/04/22  9:34 AM Phone: [phone]: [phone]

## 2022-12-12 ENCOUNTER — Ambulatory Visit: Payer: Self-pay | Admitting: Physical Therapy

## 2022-12-18 ENCOUNTER — Ambulatory Visit: Payer: Self-pay | Attending: Nurse Practitioner | Admitting: Physical Therapy

## 2022-12-18 ENCOUNTER — Other Ambulatory Visit: Payer: Self-pay

## 2022-12-18 ENCOUNTER — Encounter: Payer: Self-pay | Admitting: Physical Therapy

## 2022-12-18 DIAGNOSIS — M6281 Muscle weakness (generalized): Secondary | ICD-10-CM | POA: Insufficient documentation

## 2022-12-18 DIAGNOSIS — M25522 Pain in left elbow: Secondary | ICD-10-CM | POA: Insufficient documentation

## 2022-12-18 NOTE — Therapy (Signed)
OUTPATIENT PHYSICAL THERAPY TREATMENT NOTE   Patient Name: Rebecca Sharp MRN: [id number] DOB:1971/06/01, 52 y.o., female 67 Date: 12/18/2022   PCP: Gildardo Pounds, NP REFERRING PROVIDER: Gildardo Pounds, NP   END OF SESSION:   PT End of Session - 12/18/22 1025     Visit Number 3    Number of Visits 9    Date for PT Re-Evaluation 01/22/23    Authorization Type CAFA    PT Start Time 1015    PT Stop Time 1055    PT Time Calculation (min) 40 min    Activity Tolerance Patient tolerated treatment well    Behavior During Therapy WFL for tasks assessed/performed              Past Medical History:  Diagnosis Date   AMA (advanced maternal age) multigravida 35+    Anemia    Diabetes mellitus without complication ([place])    Ganglion cyst 01/25/2017   Left ankle   Kidney stones 2013   With pregnancy    Language barrier    Obese    Obesity 01/25/2017   Pregnancy induced hypertension    at end of last 2 pregnancies   Past Surgical History:  Procedure Laterality Date   NO PAST SURGERIES     Patient Active Problem List   Diagnosis Date Noted   Iron deficiency anemia 06/28/2022   Normocytic anemia 06/21/2022   Neck and shoulder pain 03/03/2021   GAD (generalized anxiety disorder) 03/03/2021   Stress 03/03/2021   Gastroesophageal reflux disease without esophagitis 03/03/2021   Class 2 obesity due to excess calories with body mass index (BMI) of 36.0 to 36.9 in adult 01/25/2017   Ganglion cyst 01/25/2017   Prediabetes 02/17/2016   Essential hypertension 08/19/2015   Kidney stones     REFERRING DIAG: Left elbow pain    THERAPY DIAG:  Pain in left elbow  Muscle weakness (generalized)  Rationale for Evaluation and Treatment Rehabilitation  PERTINENT HISTORY: None   PRECAUTIONS: None    SUBJECTIVE:                 SUBJECTIVE STATEMENT:  Patient reports she is feeling about the same, she is consistent with her exercises at home.  PAIN:  Are you  having pain? Yes:  NPRS scale: 4/10 Pain location: left elbow Pain description: "feels like its the bone or joint" Aggravating factors: Moving the elbow Relieving factors: Rest   OBJECTIVE: (objective measures completed at initial evaluation unless otherwise dated) PATIENT SURVEYS:  Quick Dash 18.2% functional status   POSTURE: Rounded shoulder posture   UPPER EXTREMITY ROM:    Active ROM Right eval Left eval  Shoulder flexion      Shoulder extension      Shoulder abduction      Shoulder adduction      Shoulder internal rotation      Shoulder external rotation      Elbow flexion 145 145  Elbow extension 0 0  Wrist flexion      Wrist extension      Wrist ulnar deviation      Wrist radial deviation      Wrist pronation      Wrist supination      (Blank rows = not tested)   UPPER EXTREMITY MMT:   MMT Right eval Left eval Left 12/04/22 Left 12/18/22  Shoulder flexion 5 5    Shoulder extension        Shoulder abduction  Shoulder adduction        Shoulder internal rotation        Shoulder external rotation        Middle trapezius        Lower trapezius        Elbow flexion 5 5    Elbow extension 5 4+    Wrist flexion 5 5    Wrist extension '5 4 4 4  '$ Wrist ulnar deviation        Wrist radial deviation        Wrist pronation 5 5    Wrist supination '5 4  4  '$ Grip strength (lbs) 55 54    (Blank rows = not tested)  PALPATION:  Tender to palpation at extensor mass tendon and insertion to humerus               TODAY'S TREATMENT:          [facility] Adult PT Treatment:                                                DATE: 12/18/2022 Therapeutic Exercise: UBE L3 x 4 min (fwd/bwd) while taking subjective Wrist flexion / extension with 4# 3 x 10 Pronation / supination with 2# 3 x 10 Radial deviation with 2# 2 x 10 Bicep curl in supination, neutral and pronation with 5# 2 x 10 each Putty grip 2 x 10 Finger extension with rubber band 2 x 10 Seated overhead shoulder  press with 4# 2 x 10 Shoulder flexion with 4# 2 x 10 Velcro pronation / supination 2 x 5 Row with black 2 x 15 Extension with green 2 x 15 Tricep extension with green 2 x 15 ER with red 2 x 10   [facility] Adult PT Treatment:                                                DATE: 12/04/2022 Therapeutic Exercise: UBE L1 x 4 min fwd while taking subjective Wrist extension with 3# 3 x 15 Pronation / supination with 2# 3 x 15 Bicep curl in supination, neutral and pronation with 5# 2 x 10 each Radial deviation with 2# 2 x 10 Velcro pronation / supination 2 x 3 Velcro flexion / extension 2 x 3 Overhead shoulder press with 3# 2 x 15 Shoulder flexion with 3# 2 x 10 Row with black 2 x 15 Extension with green 2 x 15  [facility] Adult PT Treatment:                                                DATE: 11/27/2022 Therapeutic Exercise: Wrist extension with green x 10 Bicep curl in pronation with green x 10 Supination with green x 10   PATIENT EDUCATION: Education details: HEP Person educated: Patient Education method: Consulting civil engineer, Demonstration, Tactile cues, Verbal cues Education comprehension: verbalized understanding, returned demonstration, verbal cues required, tactile cues required, and needs further education   HOME EXERCISE PROGRAM: Access Code: [email or web address]     ASSESSMENT: CLINICAL IMPRESSION: Patient tolerated therapy well with no adverse effects. Therapy continues  to focus on progressing wrist and elbow strength with good tolerance. She was able to increase resistance and weight with exercises this visit, but did report some increased pain and of the wrist extensor muscles and her left arm got more tired than the right. She does continue to exhibit strength deficit on the left and reports pain at wrist extensor insertion at elbow with activity. No changes made to HEP this visit. Patient would benefit from continued skilled PT to progress strength and reduce pain to maximize functional ability.      OBJECTIVE IMPAIRMENTS: decreased activity tolerance, decreased strength, postural dysfunction, and pain.    ACTIVITY LIMITATIONS: carrying and lifting   PARTICIPATION LIMITATIONS: meal prep, cleaning, and occupation   PERSONAL FACTORS: Fitness, Past/current experiences, and Time since onset of injury/illness/exacerbation are also affecting patient's functional outcome.      GOALS: Goals reviewed with patient? Yes   SHORT TERM GOALS: Target date: 12/25/2022   Patient will be I with initial HEP in order to progress with therapy. Baseline: HEP provided at eval Goal status: INITIAL   4.  Patient will report </= 4/10 pain with left elbow movement and lifting to reduce functional limitations Baseline: 6/10 Goal status: INITIAL   LONG TERM GOALS: Target date: 01/22/2023   Patient will be I with final HEP to maintain progress from PT. Baseline: HEP provided at eval Goal status: INITIAL   2.  Patient will report QuickDASH </= 10% in order to indicate improved functional ability Baseline: 18.2% disability Goal status: INITIAL   3.  Patient will exhibit left elbow and wrist strength 5/5 MMT in order to improve lifting Baseline: see limitations above Goal status: INITIAL   4.  Patient will report </= 2/10 pain with left elbow movement and lifting to reduce functional limitations Baseline: 6/10 Goal status: INITIAL     PLAN: PT FREQUENCY: 1x/week   PT DURATION: 8 weeks   PLANNED INTERVENTIONS: Therapeutic exercises, Therapeutic activity, Neuromuscular re-education, Balance training, Gait training, Patient/Family education, Self Care, Joint mobilization, Joint manipulation, Aquatic Therapy, Dry Needling, Electrical stimulation, Cryotherapy, Moist heat, Taping, Ultrasound, Ionotophoresis '4mg'$ /ml Dexamethasone, Manual therapy, and Re-evaluation   PLAN FOR NEXT SESSION: Review HEP and progress PRN, manual for extensor mass, progress elbow and wrist strengthening and postural  control   Hilda Blades, PT, DPT, LAT, ATC 12/18/22  10:58 AM Phone: [phone] Fax: [phone]

## 2022-12-20 ENCOUNTER — Other Ambulatory Visit: Payer: Self-pay

## 2022-12-20 ENCOUNTER — Encounter: Payer: Self-pay | Admitting: Physician Assistant

## 2022-12-21 ENCOUNTER — Inpatient Hospital Stay: Payer: Self-pay | Attending: Physician Assistant

## 2022-12-21 ENCOUNTER — Other Ambulatory Visit: Payer: Self-pay

## 2022-12-21 DIAGNOSIS — D508 Other iron deficiency anemias: Secondary | ICD-10-CM

## 2022-12-21 DIAGNOSIS — D509 Iron deficiency anemia, unspecified: Secondary | ICD-10-CM | POA: Insufficient documentation

## 2022-12-21 LAB — CMP (CANCER CENTER ONLY)
ALT: 10 U/L (ref 0–44)
AST: 10 U/L — ABNORMAL LOW (ref 15–41)
Albumin: 4.1 g/dL (ref 3.5–5.0)
Alkaline Phosphatase: 50 U/L (ref 38–126)
Anion gap: 4 — ABNORMAL LOW (ref 5–15)
BUN: 17 mg/dL (ref 6–20)
CO2: 27 mmol/L (ref 22–32)
Calcium: 8.9 mg/dL (ref 8.9–10.3)
Chloride: 107 mmol/L (ref 98–111)
Creatinine: 0.84 mg/dL (ref 0.44–1.00)
GFR, Estimated: >60 mL/min (ref >60)
Glucose, Bld: 84 mg/dL (ref 70–99)
Potassium: 4.6 mmol/L (ref 3.5–5.1)
Sodium: 138 mmol/L (ref 135–145)
Total Bilirubin: 0.3 mg/dL (ref 0.3–1.2)
Total Protein: 7.4 g/dL (ref 6.5–8.1)

## 2022-12-21 LAB — CBC WITH DIFFERENTIAL (CANCER CENTER ONLY)
Abs Immature Granulocytes: 0.02 10*3/uL (ref 0.00–0.07)
Basophils Absolute: 0 10*3/uL (ref 0.0–0.1)
Basophils Relative: 1 %
Eosinophils Absolute: 0.2 10*3/uL (ref 0.0–0.5)
Eosinophils Relative: 3 %
HCT: 31.6 % — ABNORMAL LOW (ref 36.0–46.0)
Hemoglobin: 10.6 g/dL — ABNORMAL LOW (ref 12.0–15.0)
Immature Granulocytes: 0 %
Lymphocytes Relative: 24 %
Lymphs Abs: 1.7 10*3/uL (ref 0.7–4.0)
MCH: 30 pg (ref 26.0–34.0)
MCHC: 33.5 g/dL (ref 30.0–36.0)
MCV: 89.5 fL (ref 80.0–100.0)
Monocytes Absolute: 0.4 10*3/uL (ref 0.1–1.0)
Monocytes Relative: 6 %
Neutro Abs: 4.8 10*3/uL (ref 1.7–7.7)
Neutrophils Relative %: 66 %
Platelet Count: 243 10*3/uL (ref 150–400)
RBC: 3.53 MIL/uL — ABNORMAL LOW (ref 3.87–5.11)
RDW: 13.2 % (ref 11.5–15.5)
WBC Count: 7.2 10*3/uL (ref 4.0–10.5)
nRBC: 0 % (ref 0.0–0.2)

## 2022-12-21 LAB — IRON AND IRON BINDING CAPACITY (CC-WL,HP ONLY)
Iron: 42 ug/dL (ref 28–170)
Saturation Ratios: 12 % (ref 10.4–31.8)
TIBC: 363 ug/dL (ref 250–450)
UIBC: 321 ug/dL (ref 148–442)

## 2022-12-21 LAB — FERRITIN: Ferritin: 32 ng/mL (ref 11–307)

## 2022-12-24 ENCOUNTER — Other Ambulatory Visit: Payer: Self-pay | Admitting: Physician Assistant

## 2022-12-25 ENCOUNTER — Telehealth: Payer: Self-pay | Admitting: Physician Assistant

## 2022-12-25 ENCOUNTER — Ambulatory Visit: Payer: Self-pay | Admitting: Physical Therapy

## 2022-12-25 ENCOUNTER — Encounter: Payer: Self-pay | Admitting: Physical Therapy

## 2022-12-25 DIAGNOSIS — M25522 Pain in left elbow: Secondary | ICD-10-CM

## 2022-12-25 DIAGNOSIS — M6281 Muscle weakness (generalized): Secondary | ICD-10-CM

## 2022-12-25 NOTE — Telephone Encounter (Signed)
Contacted patient to scheduled appointments. Left message with appointment details and a call back number if patient had any questions or could not accommodate the time we provided.   

## 2022-12-25 NOTE — Therapy (Signed)
OUTPATIENT PHYSICAL THERAPY TREATMENT NOTE   Patient Name: Rebecca Sharp MRN: [id number] DOB:12-14-1970, 52 y.o., female Today's Date: 12/25/2022   PCP: Gildardo Pounds, NP REFERRING PROVIDER: Gildardo Pounds, NP   END OF SESSION:   PT End of Session - 12/25/22 1018     Visit Number 4    Number of Visits 9    Date for PT Re-Evaluation 01/22/23    Authorization Type CAFA    PT Start Time 1016    PT Stop Time 1058    PT Time Calculation (min) 42 min              Past Medical History:  Diagnosis Date   AMA (advanced maternal age) multigravida 35+    Anemia    Diabetes mellitus without complication ([place])    Ganglion cyst 01/25/2017   Left ankle   Kidney stones 2013   With pregnancy    Language barrier    Obese    Obesity 01/25/2017   Pregnancy induced hypertension    at end of last 2 pregnancies   Past Surgical History:  Procedure Laterality Date   NO PAST SURGERIES     Patient Active Problem List   Diagnosis Date Noted   Iron deficiency anemia 06/28/2022   Normocytic anemia 06/21/2022   Neck and shoulder pain 03/03/2021   GAD (generalized anxiety disorder) 03/03/2021   Stress 03/03/2021   Gastroesophageal reflux disease without esophagitis 03/03/2021   Class 2 obesity due to excess calories with body mass index (BMI) of 36.0 to 36.9 in adult 01/25/2017   Ganglion cyst 01/25/2017   Prediabetes 02/17/2016   Essential hypertension 08/19/2015   Kidney stones     REFERRING DIAG: Left elbow pain    THERAPY DIAG:  Pain in left elbow  Muscle weakness (generalized)  Rationale for Evaluation and Treatment Rehabilitation  PERTINENT HISTORY: None   PRECAUTIONS: None    SUBJECTIVE:                 SUBJECTIVE STATEMENT:  Patient reports she is a little better. No pain at rest.   PAIN:  Are you having pain? Yes:  NPRS scale: 2-3/10 with movement  Pain location: left elbow Pain description: "feels like its the bone or joint" Aggravating  factors: Moving the elbow Relieving factors: Rest   OBJECTIVE: (objective measures completed at initial evaluation unless otherwise dated) PATIENT SURVEYS:  Quick Dash 18.2% functional status   POSTURE: Rounded shoulder posture   UPPER EXTREMITY ROM:    Active ROM Right eval Left eval  Shoulder flexion      Shoulder extension      Shoulder abduction      Shoulder adduction      Shoulder internal rotation      Shoulder external rotation      Elbow flexion 145 145  Elbow extension 0 0  Wrist flexion      Wrist extension      Wrist ulnar deviation      Wrist radial deviation      Wrist pronation      Wrist supination      (Blank rows = not tested)   UPPER EXTREMITY MMT:   MMT Right eval Left eval Left 12/04/22 Left 12/18/22  Shoulder flexion 5 5    Shoulder extension        Shoulder abduction        Shoulder adduction        Shoulder internal rotation  Shoulder external rotation        Middle trapezius        Lower trapezius        Elbow flexion 5 5    Elbow extension 5 4+    Wrist flexion 5 5    Wrist extension '5 4 4 4  '$ Wrist ulnar deviation        Wrist radial deviation        Wrist pronation 5 5    Wrist supination '5 4  4  '$ Grip strength (lbs) 55 54    (Blank rows = not tested)  PALPATION:  Tender to palpation at extensor mass tendon and insertion to humerus               TODAY'S TREATMENT:          [facility] Adult PT Treatment:                                                DATE: 12/25/22 Therapeutic Exercise: UBE L3 x  6 min (fwd/bwd) while taking subjective Velcro pronation / supination 2 x 5 Velcro wrist  ext / flexion 2 x 5 Bicep curl in supination, neutral and pronation with 5# 2 x 10 each (second set with 6#)  4# OH press 10 x 2  Supine tricep ext 4# 10 x 2  Wrist flexion / extension with 2# 3 x 10, supine Radial deviation supine 2# 10 x 2  Wrist extensor stretch Tricep ext blue band 10 x 2  Shoulder ER red bilat 10 x 2    [facility] Adult PT  Treatment:                                                DATE: 12/18/2022 Therapeutic Exercise: UBE L3 x 4 min (fwd/bwd) while taking subjective Wrist flexion / extension with 4# 3 x 10 Pronation / supination with 2# 3 x 10 Radial deviation with 2# 2 x 10 Bicep curl in supination, neutral and pronation with 5# 2 x 10 each Putty grip 2 x 10 Finger extension with rubber band 2 x 10 Seated overhead shoulder press with 4# 2 x 10 Shoulder flexion with 4# 2 x 10 Velcro pronation / supination 2 x 5 Row with black 2 x 15 Extension with green 2 x 15 Tricep extension with green 2 x 15 ER with red 2 x 10   [facility] Adult PT Treatment:                                                DATE: 12/04/2022 Therapeutic Exercise: UBE L1 x 4 min fwd while taking subjective Wrist extension with 3# 3 x 15 Pronation / supination with 2# 3 x 15 Bicep curl in supination, neutral and pronation with 5# 2 x 10 each Radial deviation with 2# 2 x 10 Velcro pronation / supination 2 x 3 Velcro flexion / extension 2 x 3 Overhead shoulder press with 3# 2 x 15 Shoulder flexion with 3# 2 x 10 Row with black 2 x 15 Extension with green 2 x 15  [facility] Adult PT Treatment:                                                DATE: 11/27/2022 Therapeutic Exercise: Wrist extension with green x 10 Bicep curl in pronation with green x 10 Supination with green x 10   PATIENT EDUCATION: Education details: HEP Person educated: Patient Education method: Explanation, Demonstration, Tactile cues, Verbal cues Education comprehension: verbalized understanding, returned demonstration, verbal cues required, tactile cues required, and needs further education   HOME EXERCISE PROGRAM: Access Code: [email or web address]     ASSESSMENT: CLINICAL IMPRESSION: Patient tolerated therapy well with no adverse effects. Therapy continues to focus on progressing wrist and elbow strength with good tolerance. She was able to increase resistance and weight with  exercises this visit, but did report some increased pain and of the wrist extensor muscles and her left arm got more tired than the right. She does continue to exhibit strength deficit on the left and reports pain at wrist extensor insertion at elbow with activity. Added wrist extensor stretch.  Patient would benefit from continued skilled PT to progress strength and reduce pain to maximize functional ability.     OBJECTIVE IMPAIRMENTS: decreased activity tolerance, decreased strength, postural dysfunction, and pain.    ACTIVITY LIMITATIONS: carrying and lifting   PARTICIPATION LIMITATIONS: meal prep, cleaning, and occupation   PERSONAL FACTORS: Fitness, Past/current experiences, and Time since onset of injury/illness/exacerbation are also affecting patient's functional outcome.      GOALS: Goals reviewed with patient? Yes   SHORT TERM GOALS: Target date: 12/25/2022   Patient will be I with initial HEP in order to progress with therapy. Baseline: HEP provided at eval Goal status: INITIAL   4.  Patient will report </= 4/10 pain with left elbow movement and lifting to reduce functional limitations Baseline: 6/10 Goal status: INITIAL   LONG TERM GOALS: Target date: 01/22/2023   Patient will be I with final HEP to maintain progress from PT. Baseline: HEP provided at eval Goal status: INITIAL   2.  Patient will report QuickDASH </= 10% in order to indicate improved functional ability Baseline: 18.2% disability Goal status: INITIAL   3.  Patient will exhibit left elbow and wrist strength 5/5 MMT in order to improve lifting Baseline: see limitations above Goal status: INITIAL   4.  Patient will report </= 2/10 pain with left elbow movement and lifting to reduce functional limitations Baseline: 6/10 Goal status: INITIAL     PLAN: PT FREQUENCY: 1x/week   PT DURATION: 8 weeks   PLANNED INTERVENTIONS: Therapeutic exercises, Therapeutic activity, Neuromuscular re-education, Balance  training, Gait training, Patient/Family education, Self Care, Joint mobilization, Joint manipulation, Aquatic Therapy, Dry Needling, Electrical stimulation, Cryotherapy, Moist heat, Taping, Ultrasound, Ionotophoresis '4mg'$ /ml Dexamethasone, Manual therapy, and Re-evaluation   PLAN FOR NEXT SESSION: Review HEP and progress PRN, manual for extensor mass, progress elbow and wrist strengthening and postural control   Hessie Diener, PTA 12/25/22 12:48 PM Phone: [phone] Fax: [phone]

## 2022-12-27 ENCOUNTER — Telehealth: Payer: Self-pay

## 2022-12-27 NOTE — Telephone Encounter (Signed)
Pt advised of lab results and the need for IV iron through Julie/interpreter.  Appt scheduled for 3/18 at [facility]

## 2022-12-27 NOTE — Telephone Encounter (Signed)
-----   Message from Lincoln Brigham, PA-C sent at 12/24/2022  9:58 PM EDT ----- Please notify patient that her hemoglobin and iron levels have dropped. We will arrange for another round of IV iron  at the cancer center.    ----- Message ----- From: Buel Ream, Lab In [place] Sent: 12/21/2022  10:14 AM EDT To: Lincoln Brigham, PA-C

## 2023-01-01 ENCOUNTER — Other Ambulatory Visit: Payer: Self-pay

## 2023-01-01 ENCOUNTER — Inpatient Hospital Stay: Payer: Self-pay

## 2023-01-01 VITALS — BP 115/71 | HR 77 | Temp 98.4°F | Resp 17

## 2023-01-01 DIAGNOSIS — D508 Other iron deficiency anemias: Secondary | ICD-10-CM

## 2023-01-01 MED ORDER — SODIUM CHLORIDE 0.9 % IV SOLN
1000.0000 mg | Freq: Once | INTRAVENOUS | Status: AC
  Administered 2023-01-01: 1000 mg via INTRAVENOUS
  Filled 2023-01-01: qty 10

## 2023-01-01 MED ORDER — SODIUM CHLORIDE 0.9 % IV SOLN: Freq: Once | INTRAVENOUS | Status: AC

## 2023-01-08 ENCOUNTER — Encounter: Payer: Self-pay | Admitting: Physical Therapy

## 2023-01-08 ENCOUNTER — Ambulatory Visit: Payer: Self-pay | Admitting: Physical Therapy

## 2023-01-08 DIAGNOSIS — M25522 Pain in left elbow: Secondary | ICD-10-CM

## 2023-01-08 DIAGNOSIS — M6281 Muscle weakness (generalized): Secondary | ICD-10-CM

## 2023-01-08 NOTE — Therapy (Signed)
OUTPATIENT PHYSICAL THERAPY TREATMENT NOTE   Patient Name: Rebecca Sharp MRN: [id number] DOB:11-16-1970, 52 y.o., female Today's Date: 01/08/2023   PCP: Gildardo Pounds, NP REFERRING PROVIDER: Gildardo Pounds, NP   END OF SESSION:   PT End of Session - 01/08/23 1019     Visit Number 5    Number of Visits 9    Date for PT Re-Evaluation 01/22/23    Authorization Type CAFA    PT Start Time 78    PT Stop Time 1100    PT Time Calculation (min) 42 min              Past Medical History:  Diagnosis Date   AMA (advanced maternal age) multigravida 35+    Anemia    Diabetes mellitus without complication ([place])    Ganglion cyst 01/25/2017   Left ankle   Kidney stones 2013   With pregnancy    Language barrier    Obese    Obesity 01/25/2017   Pregnancy induced hypertension    at end of last 2 pregnancies   Past Surgical History:  Procedure Laterality Date   NO PAST SURGERIES     Patient Active Problem List   Diagnosis Date Noted   Iron deficiency anemia 06/28/2022   Normocytic anemia 06/21/2022   Neck and shoulder pain 03/03/2021   GAD (generalized anxiety disorder) 03/03/2021   Stress 03/03/2021   Gastroesophageal reflux disease without esophagitis 03/03/2021   Class 2 obesity due to excess calories with body mass index (BMI) of 36.0 to 36.9 in adult 01/25/2017   Ganglion cyst 01/25/2017   Prediabetes 02/17/2016   Essential hypertension 08/19/2015   Kidney stones     REFERRING DIAG: Left elbow pain    THERAPY DIAG:  Pain in left elbow  Muscle weakness (generalized)  Rationale for Evaluation and Treatment Rehabilitation  PERTINENT HISTORY: None   PRECAUTIONS: None    SUBJECTIVE:                 SUBJECTIVE STATEMENT:  Patient reports she is getting better. She reports being able to pick up case of water without pain. No more than 2/10 pain since last visit.   PAIN:  Are you having pain? Yes:  NPRS scale: 2/10 with movement  Pain location:  left elbow Pain description: "feels like its the bone or joint" Aggravating factors: Moving the elbow Relieving factors: Rest   OBJECTIVE: (objective measures completed at initial evaluation unless otherwise dated) PATIENT SURVEYS:  Quick Dash 18.2% functional status   POSTURE: Rounded shoulder posture   UPPER EXTREMITY ROM:    Active ROM Right eval Left eval  Shoulder flexion      Shoulder extension      Shoulder abduction      Shoulder adduction      Shoulder internal rotation      Shoulder external rotation      Elbow flexion 145 145  Elbow extension 0 0  Wrist flexion      Wrist extension      Wrist ulnar deviation      Wrist radial deviation      Wrist pronation      Wrist supination      (Blank rows = not tested)   UPPER EXTREMITY MMT:   MMT Right eval Left eval Left 12/04/22 Left 12/18/22  Shoulder flexion 5 5    Shoulder extension        Shoulder abduction        Shoulder  adduction        Shoulder internal rotation        Shoulder external rotation        Middle trapezius        Lower trapezius        Elbow flexion 5 5    Elbow extension 5 4+    Wrist flexion 5 5    Wrist extension 5 4 4 4   Wrist ulnar deviation        Wrist radial deviation        Wrist pronation 5 5    Wrist supination 5 4  4   Grip strength (lbs) 55 54    (Blank rows = not tested)  PALPATION:  Tender to palpation at extensor mass tendon and insertion to humerus               TODAY'S TREATMENT:          [facility] Adult PT Treatment:                                                DATE: 01/08/23 Therapeutic Exercise: Velcro pronation / supination 2 x 5 Velcro wrist  ext / flexion 2 x 5 UBE L3 x  6 min (fwd/bwd) 5# OH press 10 x 2  Shoulder ER green bilat 10 x 2  Tricep ext blue band 10 x 2  Bicep curl in supination, neutral 7# 2 x 10 each Wrist flexion / extension with 3-4# 2 x 10, seated, arm propped on thigh 3# radial deviation , seated arm propped on thigh Wrist extensor  stretch Farmers carry 10#, 15# - min pain with placing KB on mat 15# Chest carry    Modalities: IONTO patch to left wrist extensor : 1 ML dexamethasone.    [facility] Adult PT Treatment:                                                DATE: 12/25/22 Therapeutic Exercise: UBE L3 x  6 min (fwd/bwd) while taking subjective Velcro pronation / supination 2 x 5 Velcro wrist  ext / flexion 2 x 5 Bicep curl in supination, neutral and pronation with 5# 2 x 10 each (second set with 6#)  4# OH press 10 x 2  Supine tricep ext 4# 10 x 2  Wrist flexion / extension with 2# 3 x 10, supine Radial deviation supine 2# 10 x 2  Wrist extensor stretch Tricep ext blue band 10 x 2  Shoulder ER red bilat 10 x 2    [facility] Adult PT Treatment:                                                DATE: 12/18/2022 Therapeutic Exercise: UBE L3 x 4 min (fwd/bwd) while taking subjective Wrist flexion / extension with 4# 3 x 10 Pronation / supination with 2# 3 x 10 Radial deviation with 2# 2 x 10 Bicep curl in supination, neutral and pronation with 5# 2 x 10 each Putty grip 2 x 10 Finger extension with rubber band 2 x 10 Seated overhead shoulder  press with 4# 2 x 10 Shoulder flexion with 4# 2 x 10 Velcro pronation / supination 2 x 5 Row with black 2 x 15 Extension with green 2 x 15 Tricep extension with green 2 x 15 ER with red 2 x 10   [facility] Adult PT Treatment:                                                DATE: 12/04/2022 Therapeutic Exercise: UBE L1 x 4 min fwd while taking subjective Wrist extension with 3# 3 x 15 Pronation / supination with 2# 3 x 15 Bicep curl in supination, neutral and pronation with 5# 2 x 10 each Radial deviation with 2# 2 x 10 Velcro pronation / supination 2 x 3 Velcro flexion / extension 2 x 3 Overhead shoulder press with 3# 2 x 15 Shoulder flexion with 3# 2 x 10 Row with black 2 x 15 Extension with green 2 x 15  [facility] Adult PT Treatment:                                                 DATE: 11/27/2022 Therapeutic Exercise: Wrist extension with green x 10 Bicep curl in pronation with green x 10 Supination with green x 10   PATIENT EDUCATION: Education details: HEP Person educated: Patient Education method: Consulting civil engineer, Demonstration, Corporate treasurer cues, Verbal cues Education comprehension: verbalized understanding, returned demonstration, verbal cues required, tactile cues required, and needs further education   HOME EXERCISE PROGRAM: Access Code: [email or web address]     ASSESSMENT: CLINICAL IMPRESSION: Patient tolerated therapy well with no adverse effects. Therapy continues to focus on progressing wrist and elbow strength with good tolerance. She reports being able to lift case of water without pain. Today she carried 15# with left arm and reported min pain with placing KB back on mat. Trial of Ionto patch to wrist extensors. She denies pain greater than 2/10 since last visit. She reports compliance with HEP. All STGs met and good progress toward LTGs. Patient would benefit from continued skilled PT to progress strength and reduce pain to maximize functional ability.     OBJECTIVE IMPAIRMENTS: decreased activity tolerance, decreased strength, postural dysfunction, and pain.    ACTIVITY LIMITATIONS: carrying and lifting   PARTICIPATION LIMITATIONS: meal prep, cleaning, and occupation   PERSONAL FACTORS: Fitness, Past/current experiences, and Time since onset of injury/illness/exacerbation are also affecting patient's functional outcome.      GOALS: Goals reviewed with patient? Yes   SHORT TERM GOALS: Target date: 12/25/2022   Patient will be I with initial HEP in order to progress with therapy. Baseline: HEP provided at eval 01/08/23: Reports compliance  Goal status: MET  4.  Patient will report </= 4/10 pain with left elbow movement and lifting to reduce functional limitations Baseline: 6/10 01/08/23: no pain higher than 2/10 Goal status: MET   LONG TERM GOALS: Target  date: 01/22/2023   Patient will be I with final HEP to maintain progress from PT. Baseline: HEP provided at eval Goal status: INITIAL   2.  Patient will report QuickDASH </= 10% in order to indicate improved functional ability Baseline: 18.2% disability Goal status: INITIAL   3.  Patient will exhibit left elbow and wrist strength  5/5 MMT in order to improve lifting Baseline: see limitations above Goal status: INITIAL   4.  Patient will report </= 2/10 pain with left elbow movement and lifting to reduce functional limitations Baseline: 6/10 Goal status: INITIAL     PLAN: PT FREQUENCY: 1x/week   PT DURATION: 8 weeks   PLANNED INTERVENTIONS: Therapeutic exercises, Therapeutic activity, Neuromuscular re-education, Balance training, Gait training, Patient/Family education, Self Care, Joint mobilization, Joint manipulation, Aquatic Therapy, Dry Needling, Electrical stimulation, Cryotherapy, Moist heat, Taping, Ultrasound, Ionotophoresis 4mg /ml Dexamethasone, Manual therapy, and Re-evaluation   PLAN FOR NEXT SESSION:assess response to ionto,  Review HEP and progress PRN, manual for extensor mass, progress elbow and wrist strengthening and postural control   Hessie Diener, PTA 01/08/23 1:10 PM Phone: [phone] Fax: [phone]

## 2023-01-15 ENCOUNTER — Other Ambulatory Visit: Payer: Self-pay

## 2023-01-15 ENCOUNTER — Encounter: Payer: Self-pay | Admitting: Physical Therapy

## 2023-01-15 ENCOUNTER — Ambulatory Visit: Payer: Self-pay | Attending: Nurse Practitioner | Admitting: Physical Therapy

## 2023-01-15 DIAGNOSIS — M25522 Pain in left elbow: Secondary | ICD-10-CM | POA: Insufficient documentation

## 2023-01-15 DIAGNOSIS — M6281 Muscle weakness (generalized): Secondary | ICD-10-CM | POA: Insufficient documentation

## 2023-01-15 NOTE — Therapy (Signed)
OUTPATIENT PHYSICAL THERAPY TREATMENT NOTE   Patient Name: Rebecca Sharp MRN: [id number] DOB:11/16/1970, 52 y.o., female 10 Date: 01/15/2023   PCP: Gildardo Pounds, NP REFERRING PROVIDER: Gildardo Pounds, NP   END OF SESSION:   PT End of Session - 01/15/23 1018     Visit Number 6    Number of Visits 9    Date for PT Re-Evaluation 01/22/23    Authorization Type CAFA    PT Start Time 1015    PT Stop Time 1055    PT Time Calculation (min) 40 min    Activity Tolerance Patient tolerated treatment well    Behavior During Therapy [facility] for tasks assessed/performed               Past Medical History:  Diagnosis Date   AMA (advanced maternal age) multigravida 35+    Anemia    Diabetes mellitus without complication    Ganglion cyst 01/25/2017   Left ankle   Kidney stones 2013   With pregnancy    Language barrier    Obese    Obesity 01/25/2017   Pregnancy induced hypertension    at end of last 2 pregnancies   Past Surgical History:  Procedure Laterality Date   NO PAST SURGERIES     Patient Active Problem List   Diagnosis Date Noted   Iron deficiency anemia 06/28/2022   Normocytic anemia 06/21/2022   Neck and shoulder pain 03/03/2021   GAD (generalized anxiety disorder) 03/03/2021   Stress 03/03/2021   Gastroesophageal reflux disease without esophagitis 03/03/2021   Class 2 obesity due to excess calories with body mass index (BMI) of 36.0 to 36.9 in adult 01/25/2017   Ganglion cyst 01/25/2017   Prediabetes 02/17/2016   Essential hypertension 08/19/2015   Kidney stones     REFERRING DIAG: Left elbow pain    THERAPY DIAG:  Pain in left elbow  Muscle weakness (generalized)  Rationale for Evaluation and Treatment Rehabilitation  PERTINENT HISTORY: None   PRECAUTIONS: None    SUBJECTIVE:                 SUBJECTIVE STATEMENT:  Patient reports she feels her elbow is getting better, she doesn't have much pain. She reports the patch helped last  visit because her elbow is no longer tender.  PAIN:  Are you having pain? Yes:  NPRS scale: 0/10 at rest, 2/10 with lifting Pain location: left elbow Pain description: "feels like its the bone or joint" Aggravating factors: Moving the elbow Relieving factors: Rest   OBJECTIVE: (objective measures completed at initial evaluation unless otherwise dated) PATIENT SURVEYS:  Quick Dash 18.2% functional status   POSTURE: Rounded shoulder posture   UPPER EXTREMITY ROM:    Active ROM Right eval Left eval  Shoulder flexion      Shoulder extension      Shoulder abduction      Shoulder adduction      Shoulder internal rotation      Shoulder external rotation      Elbow flexion 145 145  Elbow extension 0 0  Wrist flexion      Wrist extension      Wrist ulnar deviation      Wrist radial deviation      Wrist pronation      Wrist supination      (Blank rows = not tested)   UPPER EXTREMITY MMT:   MMT Right eval Left eval Left 12/04/22 Left 12/18/22 Left 01/15/2023  Shoulder flexion  5 5     Shoulder extension         Shoulder abduction         Shoulder adduction         Shoulder internal rotation         Shoulder external rotation         Middle trapezius         Lower trapezius         Elbow flexion 5 5     Elbow extension 5 4+   5  Wrist flexion 5 5     Wrist extension 5 4 4 4 5   Wrist ulnar deviation         Wrist radial deviation         Wrist pronation 5 5     Wrist supination 5 4  4  4+  Grip strength (lbs) 55 54     (Blank rows = not tested)  PALPATION:  Tender to palpation at extensor mass tendon and insertion to humerus               TODAY'S TREATMENT:          [facility] Adult PT Treatment:                                                DATE: 01/15/23 Therapeutic Exercise: UBE L3 x 4 min (fwd./bwd) while taking subjective Velcro pronation / supination x 3 Velcro wrist ext / flexion x 3 Seated bicep curl in supination, neutral, pronation 5# 2 x 10 each Seated  overhead press 5# 2 x 10 Seated wrist extension / flexion 5# 2 x 10 each Seated shoulder flexion / abduction 3# 2 x 10 Row with blue 2 x 10 Extension with red 2 x 10 ER with red 2 x 10 Tricep extension with blue 2 x 10 Modalities: IONTO extended release patch to left wrist extensor insertion: 1 ML dexamethasone x 4 hours   [facility] Adult PT Treatment:                                                DATE: 01/08/23 Therapeutic Exercise: Velcro pronation / supination 2 x 5 Velcro wrist  ext / flexion 2 x 5 UBE L3 x  6 min (fwd/bwd) 5# OH press 10 x 2  Shoulder ER green bilat 10 x 2  Tricep ext blue band 10 x 2  Bicep curl in supination, neutral 7# 2 x 10 each Wrist flexion / extension with 3-4# 2 x 10, seated, arm propped on thigh 3# radial deviation , seated arm propped on thigh Wrist extensor stretch Farmers carry 10#, 15# - min pain with placing KB on mat 15# Chest carry  Modalities: IONTO patch to left wrist extensor : 1 ML dexamethasone.   [facility] Adult PT Treatment:                                                DATE: 12/25/22 Therapeutic Exercise: UBE L3 x  6 min (fwd/bwd) while taking subjective Velcro pronation / supination 2 x 5 Velcro wrist  ext / flexion 2 x 5 Bicep curl in supination, neutral and pronation with 5# 2 x 10 each (second set with 6#)  4# OH press 10 x 2  Supine tricep ext 4# 10 x 2  Wrist flexion / extension with 2# 3 x 10, supine Radial deviation supine 2# 10 x 2  Wrist extensor stretch Tricep ext blue band 10 x 2  Shoulder ER red bilat 10 x 2    PATIENT EDUCATION: Education details: HEP, iontophoresis, discharge next visit Person educated: Patient Education method: Explanation, Demonstration, Tactile cues, Verbal cues Education comprehension: verbalized understanding, returned demonstration, verbal cues required, tactile cues required, and needs further education   HOME EXERCISE PROGRAM: Access Code: [email or web address]     ASSESSMENT: CLINICAL  IMPRESSION: Patient tolerated therapy well with no adverse effects. Therapy continues to focus primarily on strengthening for left arm with good tolerance. She does demonstrate improvement in her left elbow and wrist strength and reports decreased pain with activity. She responded well to ionto last visit so repeated treatment to left extensor mass insertion. No changes to HEP and patient would likely be discharged at next visit due to progress and improvement in symptoms.     OBJECTIVE IMPAIRMENTS: decreased activity tolerance, decreased strength, postural dysfunction, and pain.    ACTIVITY LIMITATIONS: carrying and lifting   PARTICIPATION LIMITATIONS: meal prep, cleaning, and occupation   PERSONAL FACTORS: Fitness, Past/current experiences, and Time since onset of injury/illness/exacerbation are also affecting patient's functional outcome.      GOALS: Goals reviewed with patient? Yes   SHORT TERM GOALS: Target date: 12/25/2022   Patient will be I with initial HEP in order to progress with therapy. Baseline: HEP provided at eval 01/08/23: Reports compliance  Goal status: MET  4.  Patient will report </= 4/10 pain with left elbow movement and lifting to reduce functional limitations Baseline: 6/10 01/08/23: no pain higher than 2/10 Goal status: MET   LONG TERM GOALS: Target date: 01/22/2023   Patient will be I with final HEP to maintain progress from PT. Baseline: HEP provided at eval Goal status: INITIAL   2.  Patient will report QuickDASH </= 10% in order to indicate improved functional ability Baseline: 18.2% disability Goal status: INITIAL   3.  Patient will exhibit left elbow and wrist strength 5/5 MMT in order to improve lifting Baseline: see limitations above Goal status: INITIAL   4.  Patient will report </= 2/10 pain with left elbow movement and lifting to reduce functional limitations Baseline: 6/10 Goal status: INITIAL     PLAN: PT FREQUENCY: 1x/week   PT  DURATION: 8 weeks   PLANNED INTERVENTIONS: Therapeutic exercises, Therapeutic activity, Neuromuscular re-education, Balance training, Gait training, Patient/Family education, Self Care, Joint mobilization, Joint manipulation, Aquatic Therapy, Dry Needling, Electrical stimulation, Cryotherapy, Moist heat, Taping, Ultrasound, Ionotophoresis 4mg /ml Dexamethasone, Manual therapy, and Re-evaluation   PLAN FOR NEXT SESSION: reassess LTGs and discharge if continuing to do well   Hilda Blades, PT, DPT, LAT, ATC 01/15/23  11:02 AM Phone: [phone] Fax: [phone]

## 2023-01-22 ENCOUNTER — Ambulatory Visit: Payer: Self-pay

## 2023-01-22 DIAGNOSIS — M6281 Muscle weakness (generalized): Secondary | ICD-10-CM

## 2023-01-22 DIAGNOSIS — M25522 Pain in left elbow: Secondary | ICD-10-CM

## 2023-01-22 NOTE — Therapy (Signed)
OUTPATIENT PHYSICAL THERAPY TREATMENT NOTE PHYSICAL THERAPY DISCHARGE SUMMARY  Visits from Start of Care: 7  Current functional level related to goals / functional outcomes: See goals below   Remaining deficits: N/A   Education / Equipment: See education below    Patient agrees to discharge. Patient goals were met. Patient is being discharged due to meeting the stated rehab goals.   Patient Name: Rebecca Sharp MRN: [id number] DOB:December 29, 1970, [age] y.o., female Today's Date: 01/22/2023   PCP: Claiborne RiggFleming, Zelda W, NP REFERRING PROVIDER: Claiborne RiggFleming, Zelda W, NP   END OF SESSION:   PT End of Session - 01/22/23 1018     Visit Number 7    Number of Visits 9    Date for PT Re-Evaluation 01/22/23    Authorization Type CAFA    PT Start Time 1016    PT Stop Time 1054    PT Time Calculation (min) 38 min    Activity Tolerance Patient tolerated treatment well    Behavior During Therapy WFL for tasks assessed/performed                Past Medical History:  Diagnosis Date   AMA (advanced maternal age) multigravida 35+    Anemia    Diabetes mellitus without complication    Ganglion cyst 01/25/2017   Left ankle   Kidney stones 2013   With pregnancy    Language barrier    Obese    Obesity 01/25/2017   Pregnancy induced hypertension    at end of last 2 pregnancies   Past Surgical History:  Procedure Laterality Date   NO PAST SURGERIES     Patient Active Problem List   Diagnosis Date Noted   Iron deficiency anemia 06/28/2022   Normocytic anemia 06/21/2022   Neck and shoulder pain 03/03/2021   GAD (generalized anxiety disorder) 03/03/2021   Stress 03/03/2021   Gastroesophageal reflux disease without esophagitis 03/03/2021   Class 2 obesity due to excess calories with body mass index (BMI) of 36.0 to 36.9 in adult 01/25/2017   Ganglion cyst 01/25/2017   Prediabetes 02/17/2016   Essential hypertension 08/19/2015   Kidney stones     REFERRING DIAG: Left elbow  pain    THERAPY DIAG:  Pain in left elbow  Muscle weakness (generalized)  Rationale for Evaluation and Treatment Rehabilitation  PERTINENT HISTORY: None   PRECAUTIONS: None    SUBJECTIVE:                 SUBJECTIVE STATEMENT:  Patient reports she continues to feel better. She reports that it is no longer tender to touch her elbow. She feels that she is ready for discharge. She reports that she is no longer experiencing pain.     OBJECTIVE: (objective measures completed at initial evaluation unless otherwise dated) PATIENT SURVEYS:  Quick Dash 18.2% functional status  01/22/23: QuickDASH: 0% disability    POSTURE: Rounded shoulder posture   UPPER EXTREMITY ROM:    Active ROM Right eval Left eval  Shoulder flexion      Shoulder extension      Shoulder abduction      Shoulder adduction      Shoulder internal rotation      Shoulder external rotation      Elbow flexion 145 145  Elbow extension 0 0  Wrist flexion      Wrist extension      Wrist ulnar deviation      Wrist radial deviation      Wrist pronation  Wrist supination      (Blank rows = not tested)   UPPER EXTREMITY MMT:   MMT Right eval Left eval Left 12/04/22 Left 12/18/22 Left 01/15/2023 01/22/23 Left   Shoulder flexion 5 5      Shoulder extension          Shoulder abduction          Shoulder adduction          Shoulder internal rotation          Shoulder external rotation          Middle trapezius          Lower trapezius          Elbow flexion 5 5    5   Elbow extension 5 4+   5 5  Wrist flexion 5 5    5   Wrist extension 5 4 4 4 5 5   Wrist ulnar deviation          Wrist radial deviation          Wrist pronation 5 5    5   Wrist supination 5 4  4  4+ 5  Grip strength (lbs) 55 54      (Blank rows = not tested)  PALPATION:  Tender to palpation at extensor mass tendon and insertion to humerus               TODAY'S TREATMENT:          [facility] Adult PT Treatment:                                                 DATE: 01/22/23 Therapeutic Exercise: Demonstrated and returned demo of HEP discussing ways to progress independently.  Exercises - Seated Wrist Extension with Anchored Resistance  -  2 sets - 10 reps - Seated Single Arm Elbow Flexion with Resistance  - - 2 sets - 10 reps - Forearm Supination with Resistance  -  - 2 sets - 10 reps - Standing Wrist Flexion Stretch  - - 3 x weekly - 1 sets - 3 reps - 30 hold - Standing Elbow Extension with Self-Anchored Resistance  - 1 x daily - 3 x weekly - 2 sets - 10 reps - Wrist Flexion with Resistance  - 1 x daily - 3 x weekly - 2 sets - 10 reps  Therapeutic Activity: Re-assessment to determine overall progress, educating patient on progress towards goals.    [facility] Adult PT Treatment:                                                DATE: 01/15/23 Therapeutic Exercise: UBE L3 x 4 min (fwd./bwd) while taking subjective Velcro pronation / supination x 3 Velcro wrist ext / flexion x 3 Seated bicep curl in supination, neutral, pronation 5# 2 x 10 each Seated overhead press 5# 2 x 10 Seated wrist extension / flexion 5# 2 x 10 each Seated shoulder flexion / abduction 3# 2 x 10 Row with blue 2 x 10 Extension with red 2 x 10 ER with red 2 x 10 Tricep extension with blue 2 x 10 Modalities: IONTO extended release patch to left wrist extensor insertion: 1 ML dexamethasone  x 4 hours   [facility] Adult PT Treatment:                                                DATE: 01/08/23 Therapeutic Exercise: Velcro pronation / supination 2 x 5 Velcro wrist  ext / flexion 2 x 5 UBE L3 x  6 min (fwd/bwd) 5# OH press 10 x 2  Shoulder ER green bilat 10 x 2  Tricep ext blue band 10 x 2  Bicep curl in supination, neutral 7# 2 x 10 each Wrist flexion / extension with 3-4# 2 x 10, seated, arm propped on thigh 3# radial deviation , seated arm propped on thigh Wrist extensor stretch Farmers carry 10#, 15# - min pain with placing KB on mat 15# Chest carry   Modalities: IONTO patch to left wrist extensor : 1 ML dexamethasone.   [facility] Adult PT Treatment:                                                DATE: 12/25/22 Therapeutic Exercise: UBE L3 x  6 min (fwd/bwd) while taking subjective Velcro pronation / supination 2 x 5 Velcro wrist  ext / flexion 2 x 5 Bicep curl in supination, neutral and pronation with 5# 2 x 10 each (second set with 6#)  4# OH press 10 x 2  Supine tricep ext 4# 10 x 2  Wrist flexion / extension with 2# 3 x 10, supine Radial deviation supine 2# 10 x 2  Wrist extensor stretch Tricep ext blue band 10 x 2  Shoulder ER red bilat 10 x 2    PATIENT EDUCATION: Education details: see treatment; d/c education  Person educated: Patient Education method: Explanation, Demonstration, Tactile cues, Verbal cues Education comprehension: verbalized understanding, returned demonstration, verbal cues required, tactile cues required   HOME EXERCISE PROGRAM: Access Code: [email or web address]     ASSESSMENT: CLINICAL IMPRESSION: Bita has progressed well throughout duration of care reporting resolution of her Lt elbow pain. She has full and pain free elbow and wrist strength and scores at a 0% disability on the QuickDASH. She has met all established functional goals and demonstrates independence with advanced home program. She is therefore appropriate for d/c at this time with patient in agreement with this plan.       OBJECTIVE IMPAIRMENTS: decreased activity tolerance, decreased strength, postural dysfunction, and pain.    ACTIVITY LIMITATIONS: carrying and lifting   PARTICIPATION LIMITATIONS: meal prep, cleaning, and occupation   PERSONAL FACTORS: Fitness, Past/current experiences, and Time since onset of injury/illness/exacerbation are also affecting patient's functional outcome.      GOALS: Goals reviewed with patient? Yes   SHORT TERM GOALS: Target date: 12/25/2022   Patient will be I with initial HEP in order to progress with  therapy. Baseline: HEP provided at eval 01/08/23: Reports compliance  Goal status: MET  4.  Patient will report </= 4/10 pain with left elbow movement and lifting to reduce functional limitations Baseline: 6/10 01/08/23: no pain higher than 2/10 Goal status: MET   LONG TERM GOALS: Target date: 01/22/2023   Patient will be I with final HEP to maintain progress from PT. Baseline: HEP provided at eval Goal status: met   2.  Patient will report QuickDASH </= 10% in order to indicate improved functional ability Baseline: 18.2% disability Goal status: MET   3.  Patient will exhibit left elbow and wrist strength 5/5 MMT in order to improve lifting Baseline: see limitations above Goal status: met   4.  Patient will report </= 2/10 pain with left elbow movement and lifting to reduce functional limitations Baseline: 6/10 01/22/23: reports no longer experiencing pain  Goal status: met     PLAN: PT FREQUENCY: n/a   PT DURATION: n/a   PLANNED INTERVENTIONS: Therapeutic exercises, Therapeutic activity, Neuromuscular re-education, Balance training, Gait training, Patient/Family education, Self Care, Joint mobilization, Joint manipulation, Aquatic Therapy, Dry Needling, Electrical stimulation, Cryotherapy, Moist heat, Taping, Ultrasound, Ionotophoresis 4mg /ml Dexamethasone, Manual therapy, and Re-evaluation   PLAN FOR NEXT SESSION: n/a  Letitia Libra, PT, DPT, ATC 01/22/23 1:25 PM

## 2023-01-31 ENCOUNTER — Encounter: Payer: Self-pay | Admitting: Physician Assistant

## 2023-01-31 ENCOUNTER — Other Ambulatory Visit: Payer: Self-pay

## 2023-02-01 ENCOUNTER — Other Ambulatory Visit: Payer: Self-pay

## 2023-03-05 ENCOUNTER — Encounter: Payer: Self-pay | Admitting: Nurse Practitioner

## 2023-03-05 ENCOUNTER — Ambulatory Visit: Payer: Self-pay | Attending: Nurse Practitioner | Admitting: Nurse Practitioner

## 2023-03-05 VITALS — BP 111/75 | HR 72 | Ht 59.0 in | Wt 179.0 lb

## 2023-03-05 DIAGNOSIS — I10 Essential (primary) hypertension: Secondary | ICD-10-CM

## 2023-03-05 DIAGNOSIS — R7303 Prediabetes: Secondary | ICD-10-CM

## 2023-03-05 LAB — POCT GLYCOSYLATED HEMOGLOBIN (HGB A1C): HbA1c, POC (prediabetic range): 5.5 % — AB (ref 5.7–6.4)

## 2023-03-05 NOTE — Progress Notes (Signed)
Assessment & Plan:  Rebecca Sharp was seen today for prediabetes.  Diagnoses and all orders for this visit:  Prediabetes -     POCT glycosylated hemoglobin (Hb A1C) Continue blood sugar control as discussed in office today, low carbohydrate diet, and regular physical exercise as tolerated, 150 minutes per week (30 min each day, 5 days per week, or 50 min 3 days per week).    Patient has been counseled on age-appropriate routine health concerns for screening and prevention. These are reviewed and up-to-date. Referrals have been placed accordingly. Immunizations are up-to-date or declined.    Subjective:   Chief Complaint  Patient presents with   Prediabetes   HPI Rebecca Sharp 52 y.o. female presents to office today for prediabetes. VRI was used to communicate directly with patient for the entire encounter including providing detailed patient instructions.     Patient has been counseled on age-appropriate routine health concerns for screening and prevention. These are reviewed and up-to-date. Referrals have been placed accordingly. Immunizations are up-to-date or declined.   MAMMOGRAM: UTD COLON CANCER SCREENING: UTD PAP SMEAR: UTD   Prediabetes Well controlled with diet only at this time. She is on renal dose ACE.  Lab Results  Component Value Date   HGBA1C 5.5 (A) 03/05/2023     BP Readings from Last 3 Encounters:  03/05/23 111/75  01/01/23 115/71  10/23/22 119/74      Review of Systems  Constitutional:  Negative for fever, malaise/fatigue and weight loss.  HENT: Negative.  Negative for nosebleeds.   Eyes: Negative.  Negative for blurred vision, double vision and photophobia.  Respiratory: Negative.  Negative for cough and shortness of breath.   Cardiovascular: Negative.  Negative for chest pain, palpitations and leg swelling.  Gastrointestinal: Negative.  Negative for heartburn, nausea and vomiting.  Musculoskeletal: Negative.  Negative for myalgias.   Neurological: Negative.  Negative for dizziness, focal weakness, seizures and headaches.  Psychiatric/Behavioral: Negative.  Negative for suicidal ideas.     Past Medical History:  Diagnosis Date   AMA (advanced maternal age) multigravida 35+    Anemia    Diabetes mellitus without complication (HCC)    Ganglion cyst 01/25/2017   Left ankle   Kidney stones 2013   With pregnancy    Language barrier    Obese    Obesity 01/25/2017   Pregnancy induced hypertension    at end of last 2 pregnancies    Past Surgical History:  Procedure Laterality Date   NO PAST SURGERIES      Family History  Problem Relation Age of Onset   Diabetes Father    Hypertension Father    Breast cancer Neg Hx     Social History Reviewed with no changes to be made today.   Outpatient Medications Prior to Visit  Medication Sig Dispense Refill   Blood Glucose Monitoring Suppl (TRUE METRIX METER) w/Device KIT 1 each by Does not apply route 2 (two) times daily. 1 kit 0   ferrous sulfate (FEROSUL) 325 (65 FE) MG tablet Take 1 tablet (325 mg total) by mouth daily with breakfast. 90 tablet 2   glucose blood (TRUE METRIX BLOOD GLUCOSE TEST) test strip Use as instructed 100 each 12   lisinopril (ZESTRIL) 5 MG tablet Take 1 tablet (5 mg total) by mouth daily. 90 tablet 1   metFORMIN (GLUCOPHAGE) 500 MG tablet Take 1 tablet (500 mg total) by mouth 2 (two) times daily with a meal. 180 tablet 1   TRUEplus Lancets 28G  MISC USE AS DIRECTED 2 (TWO) TIMES DAILY. 100 each 12   Omega-3 Fatty Acids (FISH OIL) 1000 MG CPDR Take 3 g by mouth daily. (Patient not taking: Reported on 03/05/2023) 90 capsule 5   No facility-administered medications prior to visit.    No Known Allergies     Objective:    BP 111/75 (BP Location: Left Arm, Patient Position: Sitting, Cuff Size: Normal)   Pulse 72   Ht 4\' 11"  (1.499 m)   Wt 179 lb (81.2 kg) Comment: Clothed  LMP 01/04/2023   SpO2 99%   BMI 36.15 kg/m  Wt Readings from Last  3 Encounters:  03/05/23 179 lb (81.2 kg)  10/23/22 175 lb (79.4 kg)  09/20/22 176 lb 8 oz (80.1 kg)    Physical Exam Vitals and nursing note reviewed.  Constitutional:      Appearance: She is well-developed.  HENT:     Head: Normocephalic and atraumatic.  Cardiovascular:     Rate and Rhythm: Normal rate and regular rhythm.     Heart sounds: Normal heart sounds. No murmur heard.    No friction rub. No gallop.  Pulmonary:     Effort: Pulmonary effort is normal. No tachypnea or respiratory distress.     Breath sounds: Normal breath sounds. No decreased breath sounds, wheezing, rhonchi or rales.  Chest:     Chest wall: No tenderness.  Abdominal:     General: Bowel sounds are normal.     Palpations: Abdomen is soft.  Musculoskeletal:        General: Normal range of motion.     Cervical back: Normal range of motion.  Skin:    General: Skin is warm and dry.  Neurological:     Mental Status: She is alert and oriented to person, place, and time.     Coordination: Coordination normal.  Psychiatric:        Behavior: Behavior normal. Behavior is cooperative.        Thought Content: Thought content normal.        Judgment: Judgment normal.          Patient has been counseled extensively about nutrition and exercise as well as the importance of adherence with medications and regular follow-up. The patient was given clear instructions to go to ER or return to medical center if symptoms don't improve, worsen or new problems develop. The patient verbalized understanding.   Follow-up: Return in about 3 months (around 06/05/2023) for physical.   Claiborne Rigg, FNP-BC [facility] [place], [place] [phone]   03/12/2023, 7:05 PM

## 2023-03-05 NOTE — Progress Notes (Signed)
No concerns. 

## 2023-03-12 ENCOUNTER — Encounter: Payer: Self-pay | Admitting: Nurse Practitioner

## 2023-03-22 ENCOUNTER — Other Ambulatory Visit: Payer: Self-pay | Admitting: Physician Assistant

## 2023-03-22 DIAGNOSIS — D508 Other iron deficiency anemias: Secondary | ICD-10-CM

## 2023-03-23 ENCOUNTER — Inpatient Hospital Stay: Payer: Self-pay | Attending: Physician Assistant | Admitting: Physician Assistant

## 2023-03-23 ENCOUNTER — Inpatient Hospital Stay: Payer: Self-pay

## 2023-03-23 ENCOUNTER — Other Ambulatory Visit: Payer: Self-pay

## 2023-03-23 VITALS — BP 123/70 | HR 77 | Temp 97.5°F | Resp 15 | Wt 181.8 lb

## 2023-03-23 DIAGNOSIS — D509 Iron deficiency anemia, unspecified: Secondary | ICD-10-CM | POA: Insufficient documentation

## 2023-03-23 DIAGNOSIS — D508 Other iron deficiency anemias: Secondary | ICD-10-CM

## 2023-03-23 LAB — CMP (CANCER CENTER ONLY)
ALT: 16 U/L (ref 0–44)
AST: 14 U/L — ABNORMAL LOW (ref 15–41)
Albumin: 4.2 g/dL (ref 3.5–5.0)
Alkaline Phosphatase: 62 U/L (ref 38–126)
Anion gap: 4 — ABNORMAL LOW (ref 5–15)
BUN: 18 mg/dL (ref 6–20)
CO2: 29 mmol/L (ref 22–32)
Calcium: 9.3 mg/dL (ref 8.9–10.3)
Chloride: 103 mmol/L (ref 98–111)
Creatinine: 0.7 mg/dL (ref 0.44–1.00)
GFR, Estimated: >60 mL/min (ref >60)
Glucose, Bld: 92 mg/dL (ref 70–99)
Potassium: 4.4 mmol/L (ref 3.5–5.1)
Sodium: 136 mmol/L (ref 135–145)
Total Bilirubin: 0.5 mg/dL (ref 0.3–1.2)
Total Protein: 7.8 g/dL (ref 6.5–8.1)

## 2023-03-23 LAB — CBC WITH DIFFERENTIAL (CANCER CENTER ONLY)
Abs Immature Granulocytes: 0.02 10*3/uL (ref 0.00–0.07)
Basophils Absolute: 0 10*3/uL (ref 0.0–0.1)
Basophils Relative: 0 %
Eosinophils Absolute: 0.3 10*3/uL (ref 0.0–0.5)
Eosinophils Relative: 4 %
HCT: 35.9 % — ABNORMAL LOW (ref 36.0–46.0)
Hemoglobin: 12 g/dL (ref 12.0–15.0)
Immature Granulocytes: 0 %
Lymphocytes Relative: 24 %
Lymphs Abs: 1.6 10*3/uL (ref 0.7–4.0)
MCH: 29.9 pg (ref 26.0–34.0)
MCHC: 33.4 g/dL (ref 30.0–36.0)
MCV: 89.3 fL (ref 80.0–100.0)
Monocytes Absolute: 0.3 10*3/uL (ref 0.1–1.0)
Monocytes Relative: 5 %
Neutro Abs: 4.5 10*3/uL (ref 1.7–7.7)
Neutrophils Relative %: 67 %
Platelet Count: 211 10*3/uL (ref 150–400)
RBC: 4.02 MIL/uL (ref 3.87–5.11)
RDW: 13.5 % (ref 11.5–15.5)
WBC Count: 6.8 10*3/uL (ref 4.0–10.5)
nRBC: 0 % (ref 0.0–0.2)

## 2023-03-23 LAB — FERRITIN: Ferritin: 201 ng/mL (ref 11–307)

## 2023-03-23 LAB — IRON AND IRON BINDING CAPACITY (CC-WL,HP ONLY)
Iron: 98 ug/dL (ref 28–170)
Saturation Ratios: 31 % (ref 10.4–31.8)
TIBC: 314 ug/dL (ref 250–450)
UIBC: 216 ug/dL (ref 148–442)

## 2023-03-23 NOTE — Progress Notes (Signed)
[facility] [phone]   [phone]  PROGRESS NOTE  Patient Care Team: Claiborne Rigg, NP as PCP - General (Nurse Practitioner)  CHIEF COMPLAINTS/PURPOSE OF CONSULTATION:  Iron deficiency anemia  TREATMENT HISTORY: --Currently on ferrous sulfate 325 mg once daily --Received IV monoferric 1000 mg x 1 dose on 07/10/2022 --Received IV monoferric 1000 mg x 1 dose on 01/01/2023   HISTORY OF PRESENTING ILLNESS:  Rebecca Sharp 52 y.o. female returns for a follow up visit for iron deficiency anemia.  She is accompanied by Raynelle Fanning, [place] translator. She was last seen on 09/20/2022 and the interim, she received IV monoferric on 01/01/2023.   On exam today, Rebecca Sharp reports her energy levels have improved after receiving IV iron. She reports her menstrual cycles are irregular with her last cycle occurring in March[place] lasting approximately 8 days. She has no other signs of bleeding or bruising. She is compliant with taking her iron pills without any side effects. She denies fevers, chills, sweats, shortness of breath, chest pain, cough, nausea, vomiting, diarrhea, constipation, headaches or dizziness.   Rest of ROS is below.  MEDICAL HISTORY:  Past Medical History:  Diagnosis Date   AMA (advanced maternal age) multigravida 35+    Anemia    Diabetes mellitus without complication (HCC)    Ganglion cyst 01/25/2017   Left ankle   Kidney stones 2013   With pregnancy    Language barrier    Obese    Obesity 01/25/2017   Pregnancy induced hypertension    at end of last 2 pregnancies    SURGICAL HISTORY: Past Surgical History:  Procedure Laterality Date   NO PAST SURGERIES      SOCIAL HISTORY: Social History   Socioeconomic History   Marital status: Single    Spouse name: Not on file   Number of children: 3   Years of education: <8th grade   Highest education level: 6th grade  Occupational History   Occupation: Multimedia programmer:  [employer]  Tobacco Use   Smoking status: Never   Smokeless tobacco: Never  Vaping Use   Vaping Use: Never used  Substance and Sexual Activity   Alcohol use: No    Alcohol/week: 0.0 standard drinks of alcohol   Drug use: No   Sexual activity: Yes    Birth control/protection: Condom, None    Comment: No current boyfriend or spouse-not interested  Other Topics Concern   Not on file  Social History Narrative   Not on file   Social Determinants of Health   Financial Resource Strain: Not on file  Food Insecurity: Food Insecurity Present (06/15/2022)   Hunger Vital Sign    Worried About Running Out of Food in the Last Year: Sometimes true    Ran Out of Food in the Last Year: Sometimes true  Transportation Needs: No Transportation Needs (06/15/2022)   PRAPARE - Administrator, Civil Service (Medical): No    Lack of Transportation (Non-Medical): No  Physical Activity: Not on file  Stress: Not on file  Social Connections: Not on file  Intimate Partner Violence: Not on file    FAMILY HISTORY: Family History  Problem Relation Age of Onset   Diabetes Father    Hypertension Father    Breast cancer Neg Hx     ALLERGIES:  has No Known Allergies.  MEDICATIONS:  Current Outpatient Medications  Medication Sig Dispense Refill   Blood Glucose Monitoring Suppl (TRUE METRIX METER) w/Device KIT  1 each by Does not apply route 2 (two) times daily. 1 kit 0   ferrous sulfate (FEROSUL) 325 (65 FE) MG tablet Take 1 tablet (325 mg total) by mouth daily with breakfast. 90 tablet 2   glucose blood (TRUE METRIX BLOOD GLUCOSE TEST) test strip Use as instructed 100 each 12   metFORMIN (GLUCOPHAGE) 500 MG tablet Take 1 tablet (500 mg total) by mouth 2 (two) times daily with a meal. 180 tablet 1   TRUEplus Lancets 28G MISC USE AS DIRECTED 2 (TWO) TIMES DAILY. 100 each 12   lisinopril (ZESTRIL) 5 MG tablet Take 1 tablet (5 mg total) by mouth daily. 90 tablet 1   Omega-3 Fatty Acids (FISH OIL)  1000 MG CPDR Take 3 g by mouth daily. (Patient not taking: Reported on 03/05/2023) 90 capsule 5   No current facility-administered medications for this visit.    REVIEW OF SYSTEMS:   Constitutional: ( - ) fevers, ( - )  chills , ( - ) night sweats Eyes: ( - ) blurriness of vision, ( - ) double vision, ( - ) watery eyes Ears, nose, mouth, throat, and face: ( - ) mucositis, ( - ) sore throat Respiratory: ( - ) cough, ( - ) dyspnea, ( - ) wheezes Cardiovascular: ( - ) palpitation, ( - ) chest discomfort, ( - ) lower extremity swelling Gastrointestinal:  ( - ) nausea, ( - ) heartburn, ( - ) change in bowel habits Skin: ( - ) abnormal skin rashes Lymphatics: ( - ) new lymphadenopathy, ( - ) easy bruising Neurological: ( - ) numbness, ( - ) tingling, ( - ) new weaknesses Behavioral/Psych: ( - ) mood change, ( - ) new changes  All other systems were reviewed with the patient and are negative.  PHYSICAL EXAMINATION: ECOG PERFORMANCE STATUS: 0 - Asymptomatic  Vitals:   03/23/23 0951  BP: 123/70  Pulse: 77  Resp: 15  Temp: (!) 97.5 F (36.4 C)  SpO2: 100%   Filed Weights   03/23/23 0951  Weight: 181 lb 12.8 oz (82.5 kg)    GENERAL: well appearing female in NAD  SKIN: skin color, texture, turgor are normal, no rashes or significant lesions EYES: conjunctiva are pink and non-injected, sclera clear OROPHARYNX: no exudate, no erythema; lips, buccal mucosa, and tongue normal  LUNGS: clear to auscultation and percussion with normal breathing effort HEART: regular rate & rhythm and no murmurs and no lower extremity edema Musculoskeletal: no cyanosis of digits and no clubbing  PSYCH: alert & oriented x 3, fluent speech NEURO: no focal motor/sensory deficits  LABORATORY DATA:  I have reviewed the data as listed    Latest Ref Rng & Units 03/23/2023    9:30 AM 12/21/2022    9:56 AM 09/20/2022    9:28 AM  CBC  WBC 4.0 - 10.5 K/uL 6.8  7.2  6.4   Hemoglobin 12.0 - 15.0 g/dL 16.1  09.6  04.5    Hematocrit 36.0 - 46.0 % 35.9  31.6  34.9   Platelets 150 - 400 K/uL 211  243  238        Latest Ref Rng & Units 12/21/2022    9:56 AM 09/20/2022    9:28 AM 09/04/2022   12:09 AM  CMP  Glucose 70 - 99 mg/dL 84  79  409   BUN 6 - 20 mg/dL 17  16  19    Creatinine 0.44 - 1.00 mg/dL 8.11  9.14  7.82   Sodium  135 - 145 mmol/L 138  136  138   Potassium 3.5 - 5.1 mmol/L 4.6  4.2  4.1   Chloride 98 - 111 mmol/L 107  105  105   CO2 22 - 32 mmol/L 27  27  21    Calcium 8.9 - 10.3 mg/dL 8.9  9.6  9.2   Total Protein 6.5 - 8.1 g/dL 7.4  7.6    Total Bilirubin 0.3 - 1.2 mg/dL 0.3  0.3    Alkaline Phos 38 - 126 U/L 50  55    AST 15 - 41 U/L 10  11    ALT 0 - 44 U/L 10  13      RADIOGRAPHIC STUDIES: I have personally reviewed the radiological images as listed and agreed with the findings in the report. No results found.  ASSESSMENT & PLAN Rebecca Sharp is a 52 y.o. female returns for a follow up for iron deficiency anemia.   #Iron deficiency anemia:  --Currently on ferrous sulfate 325 mg once a day. --Last received IV monoferric 1000 mg x 1 dose on 01/01/2023 --Labs today shows anemia has resolved with Hgb to 12.0, MCV 89.3. Iron panel shows iron 98, TIBC 314, saturation 31%, ferritin levels pending.  --No need for additional IV iron at this time --RTC in 3 months for lab only check and 6 months with labs/follow up  No orders of the defined types were placed in this encounter.   All questions were answered. The patient knows to call the clinic with any problems, questions or concerns.  I have spent a total of 25 minutes minutes of face-to-face and non-face-to-face time, preparing to see the patient,performing a medically appropriate examination, counseling and educating the patient, documenting clinical information in the electronic health record and care coordination.   Georga Kaufmann, PA-C Department of Hematology/Oncology [facility] Phone: [phone]

## 2023-03-27 ENCOUNTER — Telehealth: Payer: Self-pay

## 2023-03-27 NOTE — Telephone Encounter (Signed)
-----   Message from Briant Cedar, PA-C sent at 03/23/2023  1:52 PM EDT ----- Please notify patient that iron levels are normal. No need for additional IV iron at this time.    ----- Message ----- From: Leory Plowman, Lab In [place] Sent: 03/23/2023   9:45 AM EDT To: Briant Cedar, PA-C

## 2023-03-27 NOTE — Telephone Encounter (Signed)
Pt advised via interpreter, Julie 

## 2023-04-03 ENCOUNTER — Other Ambulatory Visit: Payer: Self-pay | Admitting: Physician Assistant

## 2023-04-03 ENCOUNTER — Other Ambulatory Visit: Payer: Self-pay

## 2023-04-03 ENCOUNTER — Other Ambulatory Visit: Payer: Self-pay | Admitting: Nurse Practitioner

## 2023-04-03 ENCOUNTER — Encounter: Payer: Self-pay | Admitting: Physician Assistant

## 2023-04-03 DIAGNOSIS — L80 Vitiligo: Secondary | ICD-10-CM

## 2023-04-03 DIAGNOSIS — I10 Essential (primary) hypertension: Secondary | ICD-10-CM

## 2023-04-03 MED ORDER — LISINOPRIL 5 MG PO TABS
5.0000 mg | ORAL_TABLET | Freq: Every day | ORAL | 1 refills | Status: DC
Start: 2023-04-03 — End: 2023-06-05
  Filled 2023-04-03 (×2): qty 90, 90d supply, fill #0

## 2023-04-03 MED ORDER — BETAMETHASONE DIPROPIONATE 0.05 % EX CREA
TOPICAL_CREAM | Freq: Two times a day (BID) | CUTANEOUS | 1 refills | Status: DC
Start: 2023-04-03 — End: 2024-06-09
  Filled 2023-04-03: qty 15, 10d supply, fill #0
  Filled 2023-04-05: qty 45, 30d supply, fill #0
  Filled 2023-07-18: qty 60, 30d supply, fill #0

## 2023-04-03 NOTE — Telephone Encounter (Signed)
Requested medication (s) are due for refill today:yes  Requested medication (s) are on the active medication list: yes (Expired 03/20/23)  Last refill:  08/23/22  Future visit scheduled:yes Notes to clinic:  Med has expired   Requested Prescriptions  Pending Prescriptions Disp Refills   lisinopril (ZESTRIL) 5 MG tablet 90 tablet 1    Sig: Take 1 tablet (5 mg total) by mouth daily.     Cardiovascular:  ACE Inhibitors Passed - 04/03/2023 11:22 AM      Passed - Cr in normal range and within 180 days    Creatinine  Date Value Ref Range Status  03/23/2023 0.70 0.44 - 1.00 mg/dL Final         Passed - K in normal range and within 180 days    Potassium  Date Value Ref Range Status  03/23/2023 4.4 3.5 - 5.1 mmol/L Final         Passed - Patient is not pregnant      Passed - Last BP in normal range    BP Readings from Last 1 Encounters:  03/23/23 123/70         Passed - Valid encounter within last 6 months    Recent Outpatient Visits           4 weeks ago Prediabetes   [facility], Shea Stakes, NP   5 months ago Left elbow pain   [place] [facility], Shea Stakes, NP   7 months ago Prediabetes   [facility] [place], Shea Stakes, NP   10 months ago Essential hypertension   [place] [facility], Shea Stakes, NP   1 year ago Prediabetes   [facility] [place], Marzella Schlein, [place]       Future Appointments             In 2 months Claiborne Rigg, NP [facility]

## 2023-04-04 ENCOUNTER — Other Ambulatory Visit: Payer: Self-pay

## 2023-04-04 ENCOUNTER — Encounter: Payer: Self-pay | Admitting: Physician Assistant

## 2023-04-05 ENCOUNTER — Other Ambulatory Visit: Payer: Self-pay

## 2023-04-12 ENCOUNTER — Other Ambulatory Visit: Payer: Self-pay

## 2023-05-16 ENCOUNTER — Encounter (HOSPITAL_COMMUNITY): Payer: Self-pay | Admitting: Emergency Medicine

## 2023-05-16 ENCOUNTER — Other Ambulatory Visit: Payer: Self-pay

## 2023-05-16 ENCOUNTER — Emergency Department (HOSPITAL_COMMUNITY)
Admission: EM | Admit: 2023-05-16 | Discharge: 2023-05-17 | Disposition: A | Discharge status: Home / Self Care | Payer: Self-pay | Attending: Emergency Medicine | Admitting: Emergency Medicine

## 2023-05-16 DIAGNOSIS — L299 Pruritus, unspecified: Secondary | ICD-10-CM | POA: Insufficient documentation

## 2023-05-16 DIAGNOSIS — Z79899 Other long term (current) drug therapy: Secondary | ICD-10-CM | POA: Insufficient documentation

## 2023-05-16 DIAGNOSIS — I10 Essential (primary) hypertension: Secondary | ICD-10-CM | POA: Insufficient documentation

## 2023-05-16 NOTE — ED Triage Notes (Signed)
Patient states today patient experienced itching on bilateral arms and legs along with feeling hot like she had a fever. Denies any noticeable rash, denies being bitten by any insect, change in detergent or eating anything she's allergic to. Denies any known allergies. States the itching has passed now and feels better but seeking evaluation. Denies taking any medication to help with itching.

## 2023-05-17 NOTE — ED Provider Notes (Signed)
[place] EMERGENCY DEPARTMENT AT [facility] Provider Note   CSN: [id number] Arrival date & time: 05/16/23  2153     History  Chief Complaint  Patient presents with   Pruritis    Rebecca Sharp is a 52 y.o. female.  Patient presents to the emergency department complaining of an episode of pruritus on bilateral arms and legs and a feeling of warmth that occurred after the patient went to bed.  She states this issue resolved itself and she no longer has any symptoms.  She denies rashes, difficulty breathing, GI upset, fever, difficulty swallowing.  She denies any known insect bite, new foods, changing of detergent or any other known environmental exposures.  She took no medicine prior to arrival at the emergency department.  Past medical history significant for generalized anxiety disorder, GERD, iron deficiency anemia, hypertension  Spanish language interpreter used throughout encounter HPI     Home Medications Prior to Admission medications   Medication Sig Start Date End Date Taking? Authorizing Provider  betamethasone dipropionate 0.05 % cream APPLY TOPICALLY 2 (TWO) TIMES DAILY. 04/03/23   Hoy Register, MD  Blood Glucose Monitoring Suppl (TRUE METRIX METER) w/Device KIT 1 each by Does not apply route 2 (two) times daily. 08/21/19   Anders Simmonds, PA-C  ferrous sulfate (FEROSUL) 325 (65 FE) MG tablet Take 1 tablet (325 mg total) by mouth daily with breakfast. 08/23/22 08/23/23  Claiborne Rigg, NP  glucose blood (TRUE METRIX BLOOD GLUCOSE TEST) test strip Use as instructed 10/23/22   Hoy Register, MD  lisinopril (ZESTRIL) 5 MG tablet Take 1 tablet (5 mg total) by mouth daily. 04/03/23 09/30/23  Hoy Register, MD  metFORMIN (GLUCOPHAGE) 500 MG tablet Take 1 tablet (500 mg total) by mouth 2 (two) times daily with a meal. 08/23/22 08/23/23  Claiborne Rigg, NP  TRUEplus Lancets 28G MISC USE AS DIRECTED 2 (TWO) TIMES DAILY. 10/23/22   Hoy Register, MD       Allergies    Patient has no known allergies.    Review of Systems   Review of Systems  Physical Exam Updated Vital Signs BP 129/73   Pulse 94   Temp 98.4 F (36.9 C) (Oral)   Resp 16   Ht 4\' 11"  (1.499 m)   Wt 82 kg   SpO2 97%   BMI 36.51 kg/m  Physical Exam Vitals and nursing note reviewed.  HENT:     Head: Normocephalic and atraumatic.  Eyes:     Pupils: Pupils are equal, round, and reactive to light.  Pulmonary:     Effort: Pulmonary effort is normal. No respiratory distress.  Musculoskeletal:        General: No signs of injury.     Cervical back: Normal range of motion.  Skin:    General: Skin is dry.     Findings: No rash.     Comments: No rash noted on bilateral upper or lower extremities, face, neck, or abdomen  Neurological:     Mental Status: She is alert.  Psychiatric:        Speech: Speech normal.        Behavior: Behavior normal.     ED Results / Procedures / Treatments   Labs (all labs ordered are listed, but only abnormal results are displayed) Labs Reviewed - No data to display  EKG None  Radiology No results found.  Procedures Procedures    Medications Ordered in ED Medications - No data to display  ED Course/ Medical Decision Making/ A&P                                 Medical Decision Making  Patient presents with a chief complaint of pruritus.  Differential diagnosis includes dermatitis, rash of unknown origin, eczema, allergic reaction, others  Patient is currently asymptomatic.  She has no rash and no longer has any symptoms of pruritus.  She is denying shortness of breath, difficulty swallowing, GI upset.  No signs of anaphylaxis or allergic reaction at this time.  Patient has taken no medication and I see no indication to administer any medication at this time.  Patient does follow with primary care.  I recommend the patient follow-up with primary care for discussion of this episode and to determine if allergy testing may  be appropriate in the future.  Patient has been provided with return precautions including difficulty breathing, difficulty swallowing, and other signs of anaphylactic reactions.  Discharge home at this time.        Final Clinical Impression(s) / ED Diagnoses Final diagnoses:  Itching    Rx / DC Orders ED Discharge Orders     None         Pamala Duffel 05/17/23 0039    Nira Conn, MD 05/18/23 [phone]

## 2023-05-17 NOTE — Discharge Instructions (Signed)
You were evaluated today for itchiness.  The cause of your symptoms is unclear at this time.  Your symptoms resolved prior to my assessment.  If you experience mild to moderate itching in the future you may try Benadryl.  Please try to see if there are any new environmental exposures such as new detergents, new soaps, or new foods that may be causing some of your symptoms.  If you develop any life-threatening symptoms such as difficulty swallowing or shortness of breath please return to the emergency department.  Otherwise please follow-up with your primary care provider.

## 2023-06-05 ENCOUNTER — Other Ambulatory Visit: Payer: Self-pay | Admitting: Obstetrics and Gynecology

## 2023-06-05 ENCOUNTER — Other Ambulatory Visit: Payer: Self-pay

## 2023-06-05 ENCOUNTER — Ambulatory Visit: Payer: Self-pay | Attending: Nurse Practitioner | Admitting: Nurse Practitioner

## 2023-06-05 ENCOUNTER — Encounter: Payer: Self-pay | Admitting: Physician Assistant

## 2023-06-05 ENCOUNTER — Encounter: Payer: Self-pay | Admitting: Nurse Practitioner

## 2023-06-05 VITALS — BP 128/78 | HR 82 | Ht 59.0 in | Wt 184.2 lb

## 2023-06-05 DIAGNOSIS — Z1211 Encounter for screening for malignant neoplasm of colon: Secondary | ICD-10-CM

## 2023-06-05 DIAGNOSIS — Z1231 Encounter for screening mammogram for malignant neoplasm of breast: Secondary | ICD-10-CM

## 2023-06-05 DIAGNOSIS — R7303 Prediabetes: Secondary | ICD-10-CM

## 2023-06-05 DIAGNOSIS — D649 Anemia, unspecified: Secondary | ICD-10-CM

## 2023-06-05 DIAGNOSIS — Z0000 Encounter for general adult medical examination without abnormal findings: Secondary | ICD-10-CM

## 2023-06-05 DIAGNOSIS — I10 Essential (primary) hypertension: Secondary | ICD-10-CM

## 2023-06-05 MED ORDER — METFORMIN HCL 500 MG PO TABS
500.0000 mg | ORAL_TABLET | Freq: Two times a day (BID) | ORAL | 1 refills | Status: DC
Start: 2023-06-05 — End: 2024-06-09
  Filled 2023-06-05: qty 180, 90d supply, fill #0
  Filled 2023-12-24: qty 60, 30d supply, fill #1
  Filled 2024-04-15: qty 60, 30d supply, fill #2

## 2023-06-05 MED ORDER — FERROUS SULFATE 325 (65 FE) MG PO TABS
325.0000 mg | ORAL_TABLET | Freq: Every day | ORAL | 2 refills | Status: DC
Start: 2023-06-05 — End: 2024-01-15
  Filled 2023-06-05 – 2023-07-18 (×2): qty 90, 90d supply, fill #0
  Filled 2023-12-24: qty 30, 30d supply, fill #1

## 2023-06-05 MED ORDER — LISINOPRIL 5 MG PO TABS
5.0000 mg | ORAL_TABLET | Freq: Every day | ORAL | 1 refills | Status: DC
Start: 2023-06-05 — End: 2024-06-09
  Filled 2023-06-05: qty 90, 90d supply, fill #0
  Filled 2023-12-24: qty 30, 30d supply, fill #0

## 2023-06-05 NOTE — Progress Notes (Signed)
Assessment & Plan:  Maria was seen today for annual exam.  Diagnoses and all orders for this visit:  Encounter for annual physical exam  Breast cancer screening by mammogram -     MS 3D SCR MAMMO BILAT BR (aka MM); Future  Colon cancer screening -     Fecal occult blood, imunochemical([facility])  Prediabetes -     Microalbumin / creatinine urine ratio -     Hemoglobin A1c -     metFORMIN (GLUCOPHAGE) 500 MG tablet; Take 1 tablet (500 mg total) by mouth 2 (two) times daily with a meal.  Anemia, unspecified type -     ferrous sulfate (FEROSUL) 325 (65 FE) MG tablet; Take 1 tablet (325 mg total) by mouth daily with breakfast.  Essential hypertension -     lisinopril (ZESTRIL) 5 MG tablet; Take 1 tablet (5 mg total) by mouth daily. Continue all antihypertensives as prescribed.  Reminded to bring in blood pressure log for follow  up appointment.  RECOMMENDATIONS: DASH/Mediterranean Diets are healthier choices for HTN.      Patient has been counseled on age-appropriate routine health concerns for screening and prevention. These are reviewed and up-to-date. Referrals have been placed accordingly. Immunizations are up-to-date or declined.    Subjective:   Chief Complaint  Patient presents with   Annual Exam   HPI Rebecca Sharp 52 y.o. female presents to office today for annual physical exam.   She has concerns of urinary frequency mostly occurring at night. She is getting up around 3-4 times to urinate. Denies any other GU symptoms  Review of Systems  Constitutional:  Negative for fever, malaise/fatigue and weight loss.  HENT: Negative.  Negative for nosebleeds.   Eyes: Negative.  Negative for blurred vision, double vision and photophobia.  Respiratory: Negative.  Negative for cough and shortness of breath.   Cardiovascular: Negative.  Negative for chest pain, palpitations and leg swelling.  Gastrointestinal: Negative.  Negative for heartburn, nausea and  vomiting.  Genitourinary:  Positive for frequency.  Musculoskeletal: Negative.  Negative for myalgias.  Skin: Negative.   Neurological: Negative.  Negative for dizziness, focal weakness, seizures and headaches.  Endo/Heme/Allergies: Negative.   Psychiatric/Behavioral: Negative.  Negative for suicidal ideas.     Past Medical History:  Diagnosis Date   AMA (advanced maternal age) multigravida 35+    Anemia    Diabetes mellitus without complication (HCC)    Ganglion cyst 01/25/2017   Left ankle   Kidney stones 2013   With pregnancy    Language barrier    Obese    Obesity 01/25/2017   Pregnancy induced hypertension    at end of last 2 pregnancies    Past Surgical History:  Procedure Laterality Date   NO PAST SURGERIES      Family History  Problem Relation Age of Onset   Diabetes Father    Hypertension Father    Breast cancer Neg Hx     Social History Reviewed with no changes to be made today.   Outpatient Medications Prior to Visit  Medication Sig Dispense Refill   betamethasone dipropionate 0.05 % cream APPLY TOPICALLY 2 (TWO) TIMES DAILY. 60 g 1   Blood Glucose Monitoring Suppl (TRUE METRIX METER) w/Device KIT 1 each by Does not apply route 2 (two) times daily. 1 kit 0   glucose blood (TRUE METRIX BLOOD GLUCOSE TEST) test strip Use as instructed 100 each 12   TRUEplus Lancets 28G MISC USE AS DIRECTED 2 (TWO) TIMES DAILY.  100 each 12   ferrous sulfate (FEROSUL) 325 (65 FE) MG tablet Take 1 tablet (325 mg total) by mouth daily with breakfast. 90 tablet 2   lisinopril (ZESTRIL) 5 MG tablet Take 1 tablet (5 mg total) by mouth daily. 90 tablet 1   metFORMIN (GLUCOPHAGE) 500 MG tablet Take 1 tablet (500 mg total) by mouth 2 (two) times daily with a meal. 180 tablet 1   No facility-administered medications prior to visit.    No Known Allergies     Objective:    BP 128/78 (BP Location: Left Arm, Patient Position: Sitting, Cuff Size: Normal)   Pulse 82   Ht 4\' 11"  (1.499  m)   Wt 184 lb 3.2 oz (83.6 kg)   LMP 04/25/2023   SpO2 100%   BMI 37.20 kg/m  Wt Readings from Last 3 Encounters:  06/05/23 184 lb 3.2 oz (83.6 kg)  05/16/23 180 lb 12.4 oz (82 kg)  03/23/23 181 lb 12.8 oz (82.5 kg)    Physical Exam Constitutional:      Appearance: She is well-developed.  HENT:     Head: Normocephalic and atraumatic.     Right Ear: Hearing, tympanic membrane, ear canal and external ear normal.     Left Ear: Hearing, tympanic membrane, ear canal and external ear normal.     Nose: Nose normal.     Right Turbinates: Not enlarged.     Left Turbinates: Not enlarged.     Mouth/Throat:     Lips: Pink.     Mouth: Mucous membranes are moist.     Dentition: No dental tenderness, gingival swelling, dental abscesses or gum lesions.     Pharynx: No oropharyngeal exudate.  Eyes:     General: No scleral icterus.       Right eye: No discharge.     Extraocular Movements: Extraocular movements intact.     Conjunctiva/sclera: Conjunctivae normal.     Pupils: Pupils are equal, round, and reactive to light.  Neck:     Thyroid: No thyromegaly.     Trachea: No tracheal deviation.  Cardiovascular:     Rate and Rhythm: Normal rate and regular rhythm.     Heart sounds: Normal heart sounds. No murmur heard.    No friction rub.  Pulmonary:     Effort: Pulmonary effort is normal. No accessory muscle usage or respiratory distress.     Breath sounds: Normal breath sounds. No decreased breath sounds, wheezing, rhonchi or rales.  Abdominal:     General: Bowel sounds are normal. There is no distension.     Palpations: Abdomen is soft. There is no mass.     Tenderness: There is no abdominal tenderness. There is no right CVA tenderness, left CVA tenderness, guarding or rebound.     Hernia: No hernia is present.  Musculoskeletal:        General: No tenderness or deformity. Normal range of motion.     Cervical back: Normal range of motion and neck supple.  Lymphadenopathy:      Cervical: No cervical adenopathy.  Skin:    General: Skin is warm and dry.     Findings: No erythema.  Neurological:     Mental Status: She is alert and oriented to person, place, and time.     Cranial Nerves: No cranial nerve deficit.     Motor: Motor function is intact.     Coordination: Coordination is intact. Coordination normal.     Gait: Gait is intact.  Deep Tendon Reflexes:     Reflex Scores:      Patellar reflexes are 1+ on the right side and 1+ on the left side. Psychiatric:        Attention and Perception: Attention normal.        Mood and Affect: Mood normal.        Speech: Speech normal.        Behavior: Behavior normal.        Thought Content: Thought content normal.        Judgment: Judgment normal.          Patient has been counseled extensively about nutrition and exercise as well as the importance of adherence with medications and regular follow-up. The patient was given clear instructions to go to ER or return to medical center if symptoms don't improve, worsen or new problems develop. The patient verbalized understanding.   Follow-up: No follow-ups on file.   Claiborne Rigg, FNP-BC [facility] [place], [place] [phone]   06/05/2023, 10:19 AM

## 2023-06-06 ENCOUNTER — Other Ambulatory Visit: Payer: Self-pay

## 2023-06-06 LAB — HEMOGLOBIN A1C
Est. average glucose Bld gHb Est-mCnc: 111 mg/dL
Hgb A1c MFr Bld: 5.5 % (ref 4.8–5.6)

## 2023-06-06 LAB — MICROALBUMIN / CREATININE URINE RATIO
Creatinine, Urine: 37.6 mg/dL
Microalb/Creat Ratio: 20 mg/g{creat} (ref 0–29)
Microalbumin, Urine: 7.7 ug/mL

## 2023-06-11 ENCOUNTER — Telehealth: Payer: Self-pay

## 2023-06-11 NOTE — Telephone Encounter (Signed)
  Pt called for lab results.  Urine  does not show any microscopic diabetic kidney changes.  Written by Claiborne Rigg, NP on 06/06/2023  4:37 PM EDT   A1c normal at this time and at goal  Written by Claiborne Rigg, NP on 06/06/2023  2:14 PM EDT  No questions.

## 2023-06-19 ENCOUNTER — Inpatient Hospital Stay: Payer: Self-pay | Attending: Physician Assistant

## 2023-06-19 DIAGNOSIS — D509 Iron deficiency anemia, unspecified: Secondary | ICD-10-CM | POA: Insufficient documentation

## 2023-06-19 DIAGNOSIS — D508 Other iron deficiency anemias: Secondary | ICD-10-CM

## 2023-06-19 LAB — CBC WITH DIFFERENTIAL (CANCER CENTER ONLY)
Abs Immature Granulocytes: 0.02 10*3/uL (ref 0.00–0.07)
Basophils Absolute: 0 10*3/uL (ref 0.0–0.1)
Basophils Relative: 1 %
Eosinophils Absolute: 0.2 10*3/uL (ref 0.0–0.5)
Eosinophils Relative: 4 %
HCT: 33 % — ABNORMAL LOW (ref 36.0–46.0)
Hemoglobin: 11.1 g/dL — ABNORMAL LOW (ref 12.0–15.0)
Immature Granulocytes: 0 %
Lymphocytes Relative: 26 %
Lymphs Abs: 1.6 10*3/uL (ref 0.7–4.0)
MCH: 30.7 pg (ref 26.0–34.0)
MCHC: 33.6 g/dL (ref 30.0–36.0)
MCV: 91.4 fL (ref 80.0–100.0)
Monocytes Absolute: 0.3 10*3/uL (ref 0.1–1.0)
Monocytes Relative: 5 %
Neutro Abs: 4.1 10*3/uL (ref 1.7–7.7)
Neutrophils Relative %: 64 %
Platelet Count: 241 10*3/uL (ref 150–400)
RBC: 3.61 MIL/uL — ABNORMAL LOW (ref 3.87–5.11)
RDW: 13.1 % (ref 11.5–15.5)
WBC Count: 6.3 10*3/uL (ref 4.0–10.5)
nRBC: 0 % (ref 0.0–0.2)

## 2023-06-19 LAB — IRON AND IRON BINDING CAPACITY (CC-WL,HP ONLY)
Iron: 64 ug/dL (ref 28–170)
Saturation Ratios: 19 % (ref 10.4–31.8)
TIBC: 335 ug/dL (ref 250–450)
UIBC: 271 ug/dL (ref 148–442)

## 2023-06-19 LAB — FERRITIN: Ferritin: 117 ng/mL (ref 11–307)

## 2023-06-27 ENCOUNTER — Telehealth: Payer: Self-pay

## 2023-06-27 NOTE — Telephone Encounter (Signed)
Pt advised via Julie/interpreter

## 2023-06-27 NOTE — Telephone Encounter (Signed)
-----   Message from Briant Cedar sent at 06/27/2023  7:06 AM EDT ----- Please notify patient that her iron levels are normal. No need for additional IV iron at this time. Her Hgb is slightly below normal which we can monitor for now and recheck in 3 months. ----- Message ----- From: Interface, Lab In [place] Sent: 06/19/2023   9:35 AM EDT To: Briant Cedar, PA-C

## 2023-07-18 ENCOUNTER — Other Ambulatory Visit: Payer: Self-pay

## 2023-07-18 ENCOUNTER — Encounter: Payer: Self-pay | Admitting: Physician Assistant

## 2023-07-19 ENCOUNTER — Other Ambulatory Visit: Payer: Self-pay

## 2023-07-20 ENCOUNTER — Other Ambulatory Visit: Payer: Self-pay

## 2023-08-09 ENCOUNTER — Ambulatory Visit
Admission: RE | Admit: 2023-08-09 | Discharge: 2023-08-09 | Disposition: A | Discharge status: Home / Self Care | Payer: No Typology Code available for payment source | Source: Ambulatory Visit | Attending: Obstetrics and Gynecology | Admitting: Obstetrics and Gynecology

## 2023-08-09 ENCOUNTER — Encounter: Payer: Self-pay | Admitting: Physician Assistant

## 2023-08-09 ENCOUNTER — Ambulatory Visit: Payer: Self-pay | Admitting: Hematology and Oncology

## 2023-08-09 ENCOUNTER — Other Ambulatory Visit: Payer: Self-pay

## 2023-08-09 VITALS — BP 144/79

## 2023-08-09 DIAGNOSIS — Z1231 Encounter for screening mammogram for malignant neoplasm of breast: Secondary | ICD-10-CM

## 2023-08-09 DIAGNOSIS — Z1211 Encounter for screening for malignant neoplasm of colon: Secondary | ICD-10-CM

## 2023-08-09 MED ORDER — NYSTATIN 100000 UNIT/GM EX POWD
1.0000 | Freq: Three times a day (TID) | CUTANEOUS | 2 refills | Status: AC
  Filled 2023-08-09: qty 30, 10d supply, fill #0

## 2023-08-09 NOTE — Patient Instructions (Signed)
Taught Rebecca Sharp about self breast awareness and gave educational materials to take home. Patient did not need a Pap smear today due to last Pap smear was in 06/09/2021 per patient.  Let her know BCCCP will cover Pap smears every 5 years unless has a history of abnormal Pap smears. Referred patient to the [facility] for screening mammogram. Appointment scheduled for 08/09/23. Patient aware of appointment and will be there. Let patient know will follow up with her within the next couple weeks with results. Rebecca Sharp verbalized understanding.  Pascal Lux, NP 12:08 PM

## 2023-08-09 NOTE — Progress Notes (Signed)
[name] is a 52 y.o. female who presents to [facility] clinic today with no complaints.    Pap Smear: Pap not smear completed today. Last Pap smear was 06/09/2021 and was normal. Per patient has no history of an abnormal Pap smear. Last Pap smear result is available in Epic.   Physical exam: Breasts Breasts symmetrical. No skin abnormalities bilateral breasts. No nipple retraction bilateral breasts. No nipple discharge bilateral breasts. No lymphadenopathy. No lumps palpated bilateral breasts.  MS DIGITAL SCREENING TOMO BILATERAL  Result Date: 06/20/2022 CLINICAL DATA:  Screening. EXAM: DIGITAL SCREENING BILATERAL MAMMOGRAM WITH TOMOSYNTHESIS AND CAD TECHNIQUE: Bilateral screening digital craniocaudal and mediolateral oblique mammograms were obtained. Bilateral screening digital breast tomosynthesis was performed. The images were evaluated with computer-aided detection. COMPARISON:  Previous exam(s). ACR Breast Density Category c: The breast tissue is heterogeneously dense, which may obscure small masses. FINDINGS: There are no findings suspicious for malignancy. IMPRESSION: No mammographic evidence of malignancy. A result letter of this screening mammogram will be mailed directly to the patient. RECOMMENDATION: Screening mammogram in one year. (Code:SM-B-01Y) BI-RADS CATEGORY  1: Negative. Electronically Signed   By: Norva Pavlov M.D.   On: 06/20/2022 07:50   MS DIGITAL SCREENING TOMO BILATERAL  Result Date: 06/15/2021 CLINICAL DATA:  Screening. EXAM: DIGITAL SCREENING BILATERAL MAMMOGRAM WITH TOMOSYNTHESIS AND CAD TECHNIQUE: Bilateral screening digital craniocaudal and mediolateral oblique mammograms were obtained. Bilateral screening digital breast tomosynthesis was performed. The images were evaluated with computer-aided detection. COMPARISON:  Previous exam(s). ACR Breast Density Category c: The breast tissue is heterogeneously dense, which may obscure small masses. FINDINGS: There  are no findings suspicious for malignancy. IMPRESSION: No mammographic evidence of malignancy. A result letter of this screening mammogram will be mailed directly to the patient. RECOMMENDATION: Screening mammogram in one year. (Code:SM-B-01Y) BI-RADS CATEGORY  1: Negative. Electronically Signed   By: Sherron Ales M.D.   On: 06/15/2021 15:53   MS DIGITAL SCREENING TOMO BILATERAL  Result Date: 05/13/2020 CLINICAL DATA:  Screening. EXAM: DIGITAL SCREENING BILATERAL MAMMOGRAM WITH TOMO AND CAD COMPARISON:  Previous exam(s). ACR Breast Density Category c: The breast tissue is heterogeneously dense, which may obscure small masses. FINDINGS: There are no findings suspicious for malignancy. Images were processed with CAD. IMPRESSION: No mammographic evidence of malignancy. A result letter of this screening mammogram will be mailed directly to the patient. RECOMMENDATION: Screening mammogram in one year. (Code:SM-B-01Y) BI-RADS CATEGORY  1: Negative. Electronically Signed   By: Hulan Saas M.D.   On: 05/13/2020 12:46   MS DIGITAL SCREENING TOMO BILATERAL  Result Date: 05/07/2019 CLINICAL DATA:  Screening. EXAM: DIGITAL SCREENING BILATERAL MAMMOGRAM WITH TOMO AND CAD COMPARISON:  Previous exam(s). ACR Breast Density Category c: The breast tissue is heterogeneously dense, which may obscure small masses. FINDINGS: There are no findings suspicious for malignancy. Images were processed with CAD. IMPRESSION: No mammographic evidence of malignancy. A result letter of this screening mammogram will be mailed directly to the patient. RECOMMENDATION: Screening mammogram in one year. (Code:SM-B-01Y) BI-RADS CATEGORY  1: Negative. Electronically Signed   By: Amie Portland M.D.   On: 05/07/2019 09:06         Pelvic/Bimanual Pap is not indicated today    Smoking History: Patient has never smoked and was not referred to quit line.    Patient Navigation: Patient education provided. Access to services provided for  patient through BCCCP program. Natale Lay interpreter provided. No transportation provided   Colorectal Cancer Screening: Per patient has never had  colonoscopy completed No complaints today. FIT test given.   Breast and Cervical Cancer Risk Assessment: Patient does not have family history of breast cancer, known genetic mutations, or radiation treatment to the chest before age 46. Patient does not have history of cervical dysplasia, immunocompromised, or DES exposure in-utero.  Risk Scores as of Encounter on 08/09/2023     Rebecca Sharp           5-year 0.9%   Lifetime 7.92%   This patient is [place]/Lat[place] but has no documented birth country, so the [place] model used data from [place] patients to calculate their risk score. Document a birth country in the Demographics activity for a more accurate score.         Last calculated by Caprice Red, CMA on 08/09/2023 at 11:42 AM        A: BCCCP exam without pap smear No complaints with benign exam.   P: Referred patient to the [facility] for a screening mammogram. Appointment scheduled 08/09/23.  Pascal Lux, NP 08/09/2023 12:03 PM

## 2023-08-10 ENCOUNTER — Other Ambulatory Visit: Payer: Self-pay

## 2023-08-17 ENCOUNTER — Other Ambulatory Visit: Payer: Self-pay

## 2023-08-28 LAB — FECAL OCCULT BLOOD, IMMUNOCHEMICAL: Fecal Occult Bld: NEGATIVE

## 2023-09-24 ENCOUNTER — Ambulatory Visit: Payer: Self-pay | Admitting: Hematology and Oncology

## 2023-09-24 ENCOUNTER — Other Ambulatory Visit: Payer: Self-pay

## 2023-09-29 NOTE — Progress Notes (Unsigned)
Patient Care Team: Claiborne Rigg, NP as PCP - General (Nurse Practitioner)   CHIEF COMPLAINT: Follow up IDA  CURRENT THERAPY:  -Currently on ferrous sulfate 325 mg once daily -Received IV monoferric 1000 mg x 1 dose on 07/10/2022 -Received IV monoferric 1000 mg x 1 dose on 01/01/2023  INTERVAL HISTORY [name] returns for follow up as scheduled. Last seen by Georga Kaufmann, PA 03/23/23. She continues oral iron. Labs 06/2023 showed mild anemia with normal ferritin. Recent FOBT negative.  Denies any bleeding.  LMP 09/04/2023, periods are normal flow for 2 days and spotting for 2 more days.  She is taking oral iron with magnesium to help her digest better, denies constipation.  Denies symptoms of anemia, infection, pica, or any other new or specific complaints.  ROS  All other systems reviewed and negative  Past Medical History:  Diagnosis Date   AMA (advanced maternal age) multigravida 35+    Anemia    Diabetes mellitus without complication (HCC)    Ganglion cyst 01/25/2017   Left ankle   Kidney stones 2013   With pregnancy    Language barrier    Obese    Obesity 01/25/2017   Pregnancy induced hypertension    at end of last 2 pregnancies     Past Surgical History:  Procedure Laterality Date   NO PAST SURGERIES       Outpatient Encounter Medications as of 10/01/2023  Medication Sig   betamethasone dipropionate 0.05 % cream APPLY TOPICALLY 2 (TWO) TIMES DAILY.   Blood Glucose Monitoring Suppl (TRUE METRIX METER) w/Device KIT 1 each by Does not apply route 2 (two) times daily.   ferrous sulfate (FEROSUL) 325 (65 FE) MG tablet Take 1 tablet (325 mg total) by mouth daily with breakfast.   glucose blood (TRUE METRIX BLOOD GLUCOSE TEST) test strip Use as instructed   lisinopril (ZESTRIL) 5 MG tablet Take 1 tablet (5 mg total) by mouth daily.   metFORMIN (GLUCOPHAGE) 500 MG tablet Take 1 tablet (500 mg total) by mouth 2 (two) times daily with a meal.   nystatin (MYCOSTATIN/NYSTOP)  powder Apply 1 Application topically 3 (three) times daily.   TRUEplus Lancets 28G MISC USE AS DIRECTED 2 (TWO) TIMES DAILY.   No facility-administered encounter medications on file as of 10/01/2023.     Today's Vitals   10/01/23 1041 10/01/23 1050  BP: 117/85   Pulse: 86   Resp: 16   Temp: 97.9 F (36.6 C)   TempSrc: Temporal   SpO2: 100%   Weight: 183 lb 14.4 oz (83.4 kg)   PainSc:  4    Body mass index is 37.14 kg/m.   PHYSICAL EXAM GENERAL:alert, no distress and comfortable SKIN: no rash  EYES: sclera clear LUNGS:  normal breathing effort HEART:  no lower extremity edema ABDOMEN: abdomen soft, non-tender and normal bowel sounds NEURO: alert & oriented x 3 with fluent speech  CBC    Component Value Date/Time   WBC 6.5 10/01/2023 1020   WBC 9.1 09/04/2022 0009   RBC 4.01 10/01/2023 1020   HGB 11.4 (L) 10/01/2023 1020   HGB 11.0 (L) 05/22/2022 1622   HCT 35.0 (L) 10/01/2023 1020   HCT 33.3 (L) 05/22/2022 1622   PLT 266 10/01/2023 1020   PLT 252 05/22/2022 1622   MCV 87.3 10/01/2023 1020   MCV 88 05/22/2022 1622   MCH 28.4 10/01/2023 1020   MCHC 32.6 10/01/2023 1020   RDW 13.3 10/01/2023 1020   RDW  13.2 05/22/2022 1622   LYMPHSABS 1.7 10/01/2023 1020   LYMPHSABS 2.2 05/22/2022 1622   MONOABS 0.4 10/01/2023 1020   EOSABS 0.3 10/01/2023 1020   EOSABS 0.3 05/22/2022 1622   BASOSABS 0.0 10/01/2023 1020   BASOSABS 0.0 05/22/2022 1622     CMP     Component Value Date/Time   NA 136 03/23/2023 0930   NA 137 05/22/2022 1622   K 4.4 03/23/2023 0930   CL 103 03/23/2023 0930   CO2 29 03/23/2023 0930   GLUCOSE 92 03/23/2023 0930   BUN 18 03/23/2023 0930   BUN 16 05/22/2022 1622   CREATININE 0.70 03/23/2023 0930   CALCIUM 9.3 03/23/2023 0930   PROT 7.8 03/23/2023 0930   PROT 7.3 05/22/2022 1622   ALBUMIN 4.2 03/23/2023 0930   ALBUMIN 4.2 05/22/2022 1622   AST 14 (L) 03/23/2023 0930   ALT 16 03/23/2023 0930   ALKPHOS 62 03/23/2023 0930   BILITOT 0.5  03/23/2023 0930   GFRNONAA >60 03/23/2023 0930   GFRAA 103 05/21/2020 1642     ASSESSMENT & PLAN: 52 yo female   -Iron deficiency anemia   -She denies menorrhagia, and has not had GI work up that I can see, but FOBT negative on 08/21/23 -She continues oral iron, tolerating well, and IV iron PRN, last given monoferric x1 01/01/23. She responded well -Ms. Benitez Rebecca Sharp appears stable. No signs of bleeding of symptoms of anemia. She is tolerating oral iron and will continue -Labs reviewed, mild anemia is stable, Hgb 11.4, Iron/TIBC are normal, and ferritin is pending -Will set up IV iron if ferritin is low.  -Lab q3 months -F/up in 6 months     PLAN: -Labs reviewed, ferritin is pending -Continue oral iron, arrange IV iron if ferritin is low -Lab q3 months -F/up in 6 months, or sooner if needed  Orders Placed This Encounter  Procedures   CBC with Differential ([facility] Only)    Standing Status:   Future    Expected Date:   12/30/2023    Expiration Date:   09/30/2024   Ferritin    Standing Status:   Future    Expected Date:   12/30/2023    Expiration Date:   09/30/2024   Iron and Iron Binding Capacity ([facility] only)    Standing Status:   Future    Expected Date:   12/30/2023    Expiration Date:   09/30/2024      All questions were answered. The patient knows to call the clinic with any problems, questions or concerns. No barriers to learning were detected with use of remote Spanish speaking interpreter services.   Santiago Glad, NP-C 10/01/2023

## 2023-10-01 ENCOUNTER — Encounter: Payer: Self-pay | Admitting: Nurse Practitioner

## 2023-10-01 ENCOUNTER — Inpatient Hospital Stay: Payer: No Typology Code available for payment source | Attending: Physician Assistant

## 2023-10-01 ENCOUNTER — Inpatient Hospital Stay (HOSPITAL_BASED_OUTPATIENT_CLINIC_OR_DEPARTMENT_OTHER): Payer: No Typology Code available for payment source | Admitting: Nurse Practitioner

## 2023-10-01 VITALS — BP 117/85 | HR 86 | Temp 97.9°F | Resp 16 | Wt 183.9 lb

## 2023-10-01 DIAGNOSIS — D508 Other iron deficiency anemias: Secondary | ICD-10-CM

## 2023-10-01 DIAGNOSIS — D509 Iron deficiency anemia, unspecified: Secondary | ICD-10-CM | POA: Insufficient documentation

## 2023-10-01 DIAGNOSIS — D649 Anemia, unspecified: Secondary | ICD-10-CM

## 2023-10-01 LAB — CBC WITH DIFFERENTIAL (CANCER CENTER ONLY)
Abs Immature Granulocytes: 0.01 10*3/uL (ref 0.00–0.07)
Basophils Absolute: 0 10*3/uL (ref 0.0–0.1)
Basophils Relative: 1 %
Eosinophils Absolute: 0.3 10*3/uL (ref 0.0–0.5)
Eosinophils Relative: 4 %
HCT: 35 % — ABNORMAL LOW (ref 36.0–46.0)
Hemoglobin: 11.4 g/dL — ABNORMAL LOW (ref 12.0–15.0)
Immature Granulocytes: 0 %
Lymphocytes Relative: 27 %
Lymphs Abs: 1.7 10*3/uL (ref 0.7–4.0)
MCH: 28.4 pg (ref 26.0–34.0)
MCHC: 32.6 g/dL (ref 30.0–36.0)
MCV: 87.3 fL (ref 80.0–100.0)
Monocytes Absolute: 0.4 10*3/uL (ref 0.1–1.0)
Monocytes Relative: 6 %
Neutro Abs: 4.1 10*3/uL (ref 1.7–7.7)
Neutrophils Relative %: 62 %
Platelet Count: 266 10*3/uL (ref 150–400)
RBC: 4.01 MIL/uL (ref 3.87–5.11)
RDW: 13.3 % (ref 11.5–15.5)
WBC Count: 6.5 10*3/uL (ref 4.0–10.5)
nRBC: 0 % (ref 0.0–0.2)

## 2023-10-01 LAB — IRON AND IRON BINDING CAPACITY (CC-WL,HP ONLY)
Iron: 70 ug/dL (ref 28–170)
Saturation Ratios: 19 % (ref 10.4–31.8)
TIBC: 368 ug/dL (ref 250–450)
UIBC: 298 ug/dL (ref 148–442)

## 2023-10-01 LAB — FERRITIN: Ferritin: 68 ng/mL (ref 11–307)

## 2023-10-03 ENCOUNTER — Other Ambulatory Visit: Payer: Self-pay

## 2023-10-03 ENCOUNTER — Telehealth: Payer: Self-pay

## 2023-10-03 NOTE — Telephone Encounter (Signed)
-----   Message from Pollyann Samples sent at 10/02/2023  1:26 PM EST ----- Please have Raynelle Fanning call to let pt know ferritin is 68, she does not need IV Iron. Continue oral and repeat labs in 3 months.   Thanks Lacie NP

## 2023-10-03 NOTE — Telephone Encounter (Signed)
Pt advised via telephone call from Julie/interpreter

## 2023-12-16 ENCOUNTER — Encounter (HOSPITAL_COMMUNITY): Payer: Self-pay

## 2023-12-16 ENCOUNTER — Ambulatory Visit (HOSPITAL_COMMUNITY)
Admission: EM | Admit: 2023-12-16 | Discharge: 2023-12-16 | Disposition: A | Discharge status: Home / Self Care | Attending: Emergency Medicine | Admitting: Emergency Medicine

## 2023-12-16 DIAGNOSIS — J029 Acute pharyngitis, unspecified: Secondary | ICD-10-CM

## 2023-12-16 DIAGNOSIS — H109 Unspecified conjunctivitis: Secondary | ICD-10-CM

## 2023-12-16 LAB — POCT RAPID STREP A (OFFICE): Rapid Strep A Screen: NEGATIVE

## 2023-12-16 MED ORDER — ERYTHROMYCIN 5 MG/GM OP OINT: TOPICAL_OINTMENT | OPHTHALMIC | 0 refills | Status: DC

## 2023-12-16 NOTE — Discharge Instructions (Addendum)
 La prueba de estreptococo dio negativo. Las grgaras con solucin salina tibia, el t con miel y la alternancia entre Tylenol e ibuprofeno pueden ayudar a Engineer, materials de garganta.  Evite rascarse o picarse el ojo. Puede usar un trapo fro para limpiar cualquier secrecin o secrecin. Use la pomada de eritromicina 4 veces al da en el prpado inferior durante los prximos 5 das para ayudar a prevenir una infeccin bacteriana.  Vuelva a la clnica si tiene sntomas nuevos o urgentes.  Your strep test was negative.  Warm saline gargles, tea with honey and alternating between Tylenol and ibuprofen can help soothe your sore throat.  Avoid scratching or itching at your eye.  You can use a cool rag to clean any drainage or discharge.  Use the erythromycin ointment 4 times daily to the lower eyelid for the next 5 days to help prevent bacterial infection.  Return to clinic for new or urgent symptoms.

## 2023-12-16 NOTE — ED Triage Notes (Signed)
 Pt states that she has some left eye redness and drainage x1 day  Pt states that she has a sore throat x10 days

## 2023-12-16 NOTE — ED Provider Notes (Signed)
 [facility]    CSN: [id number] Arrival date & time: 12/16/23  1212      History   Chief Complaint Chief Complaint  Patient presents with   Eye Problem    Left eye drainage and redness x1 day   Sore Throat    Sore throat x10 days    HPI Saylee Sherrill is a 53 y.o. female.   Spanish language interpretor used for this visit.   Patient presents over concern of left eye redness x1 day and sore throat for 10 days. Today she woke up with crusting discharge over her lashes from the left eye and itching, denies vision changes. Does not wear contacts or glasses.   Sore throat started hurting on a Thursday, pain with swallowing. Also having posterior neck pain. With swallowing the back of her neck hurts as well. Denies fevers.   Tried flanax at home for throat.   The history is provided by the patient and medical records. The history is limited by a language barrier. A language interpreter was used.  Eye Problem Sore Throat    Past Medical History:  Diagnosis Date   AMA (advanced maternal age) multigravida 35+    Anemia    Diabetes mellitus without complication (HCC)    Ganglion cyst 01/25/2017   Left ankle   Kidney stones 2013   With pregnancy    Language barrier    Obese    Obesity 01/25/2017   Pregnancy induced hypertension    at end of last 2 pregnancies    Patient Active Problem List   Diagnosis Date Noted   Iron deficiency anemia 06/28/2022   Normocytic anemia 06/21/2022   Neck and shoulder pain 03/03/2021   GAD (generalized anxiety disorder) 03/03/2021   Stress 03/03/2021   Gastroesophageal reflux disease without esophagitis 03/03/2021   Class 2 obesity due to excess calories with body mass index (BMI) of 36.0 to 36.9 in adult 01/25/2017   Ganglion cyst 01/25/2017   Prediabetes 02/17/2016   Essential hypertension 08/19/2015   Kidney stones     Past Surgical History:  Procedure Laterality Date   NO PAST SURGERIES      OB History      Gravida  3   Para  3   Term  3   Preterm  0   AB  0   Living  3      SAB  0   IAB  0   Ectopic  0   Multiple  0   Live Births  3            Home Medications    Prior to Admission medications   Medication Sig Start Date End Date Taking? Authorizing Provider  betamethasone dipropionate 0.05 % cream APPLY TOPICALLY 2 (TWO) TIMES DAILY. 04/03/23  Yes Hoy Register, MD  Blood Glucose Monitoring Suppl (TRUE METRIX METER) w/Device KIT 1 each by Does not apply route 2 (two) times daily. 08/21/19  Yes Anders Simmonds, PA-C  erythromycin ophthalmic ointment Place a 1/2 inch ribbon of ointment into the left lower eyelid 4x daily for 5 days. 12/16/23  Yes Rinaldo Ratel, [place] N, FNP  ferrous sulfate (FEROSUL) 325 (65 FE) MG tablet Take 1 tablet (325 mg total) by mouth daily with breakfast. 06/05/23 06/04/24 Yes Claiborne Rigg, NP  glucose blood (TRUE METRIX BLOOD GLUCOSE TEST) test strip Use as instructed 10/23/22  Yes Newlin, Odette Horns, MD  metFORMIN (GLUCOPHAGE) 500 MG tablet Take 1 tablet (500 mg total) by  mouth 2 (two) times daily with a meal. 06/05/23 06/04/24 Yes Claiborne Rigg, NP  nystatin (MYCOSTATIN/NYSTOP) powder Apply 1 Application topically 3 (three) times daily. 08/09/23  Yes Ilda Basset A, NP  TRUEplus Lancets 28G MISC USE AS DIRECTED 2 (TWO) TIMES DAILY. 10/23/22  Yes Hoy Register, MD  lisinopril (ZESTRIL) 5 MG tablet Take 1 tablet (5 mg total) by mouth daily. 06/05/23 12/02/23  Claiborne Rigg, NP    Family History Family History  Problem Relation Age of Onset   Diabetes Father    Hypertension Father    Breast cancer Neg Hx     Social History Social History   Tobacco Use   Smoking status: Never   Smokeless tobacco: Never  Vaping Use   Vaping status: Never Used  Substance Use Topics   Alcohol use: No    Alcohol/week: 0.0 standard drinks of alcohol   Drug use: No     Allergies   Patient has no known allergies.   Review of Systems Review of  Systems   Physical Exam Triage Vital Signs ED Triage Vitals  Encounter Vitals Group     BP 12/16/23 1255 129/80     Systolic BP Percentile --      Diastolic BP Percentile --      Pulse Rate 12/16/23 1255 85     Resp 12/16/23 1255 18     Temp 12/16/23 1255 97.9 F (36.6 C)     Temp Source 12/16/23 1255 Oral     SpO2 12/16/23 1255 98 %     Weight 12/16/23 1252 190 lb (86.2 kg)     Height --      Head Circumference --      Peak Flow --      Pain Score 12/16/23 1252 4     Pain Loc --      Pain Education --      Exclude from Growth Chart --    No data found.  Updated Vital Signs BP 129/80 (BP Location: Left Arm)   Pulse 85   Temp 97.9 F (36.6 C) (Oral)   Resp 18   Wt 190 lb (86.2 kg)   SpO2 98%   BMI 38.38 kg/m   Visual Acuity Right Eye Distance:   Left Eye Distance:   Bilateral Distance:    Right Eye Near:   Left Eye Near:    Bilateral Near:     Physical Exam Vitals and nursing note reviewed.  Constitutional:      Appearance: Normal appearance. She is well-developed.  HENT:     Head: Normocephalic.     Right Ear: External ear normal.     Left Ear: External ear normal.     Nose: No congestion or rhinorrhea.     Mouth/Throat:     Mouth: Mucous membranes are moist.     Pharynx: Posterior oropharyngeal erythema present.     Tonsils: No tonsillar exudate or tonsillar abscesses. 0 on the right. 0 on the left.  Eyes:     General:        Left eye: Discharge present.    Pupils: Pupils are equal, round, and reactive to light.  Cardiovascular:     Rate and Rhythm: Normal rate.  Pulmonary:     Effort: Pulmonary effort is normal. No respiratory distress.     Breath sounds: Normal breath sounds.  Musculoskeletal:     Cervical back: Normal range of motion and neck supple. No tenderness.  Skin:    General:  Skin is warm and dry.  Neurological:     General: No focal deficit present.     Mental Status: She is alert.  Psychiatric:        Mood and Affect: Mood  normal.      UC Treatments / Results  Labs (all labs ordered are listed, but only abnormal results are displayed) Labs Reviewed  POCT RAPID STREP A (OFFICE)    EKG   Radiology No results found.  Procedures Procedures (including critical care time)  Medications Ordered in UC Medications - No data to display  Initial Impression / Assessment and Plan / UC Course  I have reviewed the triage vital signs and the nursing notes.  Pertinent labs & imaging results that were available during my care of the patient were reviewed by me and considered in my medical decision making (see chart for details).  Vitals and triage reviewed, patient is hemodynamically stable.  Posterior pharynx with erythema, without exudate.  Afebrile.  Staff swabbed for strep, this was negative.  Left eye with conjunctival injection and scant discharge.  Will cover with erythromycin to help prevent bacterial ideology.  Symptomatic management for sore throat discussed.  Plan of care, follow-up care return precautions given, no questions at this time.     Final Clinical Impressions(s) / UC Diagnoses   Final diagnoses:  Pharyngitis, unspecified etiology  Conjunctivitis of left eye, unspecified conjunctivitis type     Discharge Instructions      La prueba de estreptococo dio negativo. Las grgaras con solucin salina tibia, el t con miel y la alternancia entre Tylenol e ibuprofeno pueden ayudar a Engineer, materials de garganta.  Evite rascarse o picarse el ojo. Puede usar un trapo fro para limpiar cualquier secrecin o secrecin. Use la pomada de eritromicina 4 veces al da en el prpado inferior durante los prximos 5 das para ayudar a prevenir una infeccin bacteriana.  Vuelva a la clnica si tiene sntomas nuevos o urgentes.  Your strep test was negative.  Warm saline gargles, tea with honey and alternating between Tylenol and ibuprofen can help soothe your sore throat.  Avoid scratching or itching at  your eye.  You can use a cool rag to clean any drainage or discharge.  Use the erythromycin ointment 4 times daily to the lower eyelid for the next 5 days to help prevent bacterial infection.  Return to clinic for new or urgent symptoms.     ED Prescriptions     Medication Sig Dispense Auth. Provider   erythromycin ophthalmic ointment Place a 1/2 inch ribbon of ointment into the left lower eyelid 4x daily for 5 days. 3.5 g Emmerson Taddei, [place] N, FNP      PDMP not reviewed this encounter.   Josejulian Tarango, [place] N, [place] 12/16/23 [phone]

## 2023-12-24 ENCOUNTER — Encounter: Payer: Self-pay | Admitting: Physician Assistant

## 2023-12-24 ENCOUNTER — Other Ambulatory Visit: Payer: Self-pay

## 2023-12-25 ENCOUNTER — Other Ambulatory Visit: Payer: Self-pay

## 2023-12-31 ENCOUNTER — Inpatient Hospital Stay: Payer: No Typology Code available for payment source | Attending: Physician Assistant

## 2023-12-31 DIAGNOSIS — D649 Anemia, unspecified: Secondary | ICD-10-CM

## 2023-12-31 DIAGNOSIS — D508 Other iron deficiency anemias: Secondary | ICD-10-CM

## 2023-12-31 DIAGNOSIS — D509 Iron deficiency anemia, unspecified: Secondary | ICD-10-CM | POA: Insufficient documentation

## 2023-12-31 LAB — FERRITIN: Ferritin: 37 ng/mL (ref 11–307)

## 2023-12-31 LAB — IRON AND IRON BINDING CAPACITY (CC-WL,HP ONLY)
Iron: 65 ug/dL (ref 28–170)
Saturation Ratios: 18 % (ref 10.4–31.8)
TIBC: 367 ug/dL (ref 250–450)
UIBC: 302 ug/dL (ref 148–442)

## 2023-12-31 LAB — CBC WITH DIFFERENTIAL (CANCER CENTER ONLY)
Abs Immature Granulocytes: 0.01 10*3/uL (ref 0.00–0.07)
Basophils Absolute: 0 10*3/uL (ref 0.0–0.1)
Basophils Relative: 1 %
Eosinophils Absolute: 0.2 10*3/uL (ref 0.0–0.5)
Eosinophils Relative: 4 %
HCT: 33.8 % — ABNORMAL LOW (ref 36.0–46.0)
Hemoglobin: 10.9 g/dL — ABNORMAL LOW (ref 12.0–15.0)
Immature Granulocytes: 0 %
Lymphocytes Relative: 26 %
Lymphs Abs: 1.6 10*3/uL (ref 0.7–4.0)
MCH: 27.2 pg (ref 26.0–34.0)
MCHC: 32.2 g/dL (ref 30.0–36.0)
MCV: 84.3 fL (ref 80.0–100.0)
Monocytes Absolute: 0.4 10*3/uL (ref 0.1–1.0)
Monocytes Relative: 6 %
Neutro Abs: 3.9 10*3/uL (ref 1.7–7.7)
Neutrophils Relative %: 63 %
Platelet Count: 260 10*3/uL (ref 150–400)
RBC: 4.01 MIL/uL (ref 3.87–5.11)
RDW: 13.9 % (ref 11.5–15.5)
WBC Count: 6.1 10*3/uL (ref 4.0–10.5)
nRBC: 0 % (ref 0.0–0.2)

## 2024-01-03 ENCOUNTER — Telehealth: Payer: Self-pay

## 2024-01-03 ENCOUNTER — Other Ambulatory Visit: Payer: Self-pay | Admitting: Physician Assistant

## 2024-01-03 NOTE — Telephone Encounter (Signed)
 Pt advised via interpreter/Julie

## 2024-01-03 NOTE — Telephone Encounter (Signed)
-----   Message from Briant Cedar sent at 01/03/2024 10:24 AM EDT ----- Rebecca Sharp-please notify patient that her iron levels have dropped along with being more anemic. We will arrange for IV iron at [facility] ----- Message ----- From: Pollyann Samples, NP Sent: 01/02/2024   1:06 PM EDT To: Briant Cedar, PA-C   ----- Message ----- From: Leory Plowman, Lab In [place] Sent: 12/31/2023  10:37 AM EDT To: Pollyann Samples, NP

## 2024-01-15 ENCOUNTER — Encounter: Payer: Self-pay | Admitting: Nurse Practitioner

## 2024-01-15 ENCOUNTER — Ambulatory Visit: Payer: Self-pay | Attending: Nurse Practitioner | Admitting: Nurse Practitioner

## 2024-01-15 ENCOUNTER — Other Ambulatory Visit: Payer: Self-pay

## 2024-01-15 ENCOUNTER — Encounter: Payer: Self-pay | Admitting: Physician Assistant

## 2024-01-15 VITALS — BP 114/71 | HR 68 | Resp 20 | Ht 59.0 in | Wt 182.4 lb

## 2024-01-15 DIAGNOSIS — E1165 Type 2 diabetes mellitus with hyperglycemia: Secondary | ICD-10-CM

## 2024-01-15 DIAGNOSIS — D649 Anemia, unspecified: Secondary | ICD-10-CM

## 2024-01-15 DIAGNOSIS — E7800 Pure hypercholesterolemia, unspecified: Secondary | ICD-10-CM

## 2024-01-15 DIAGNOSIS — R7303 Prediabetes: Secondary | ICD-10-CM

## 2024-01-15 DIAGNOSIS — I10 Essential (primary) hypertension: Secondary | ICD-10-CM

## 2024-01-15 DIAGNOSIS — Z09 Encounter for follow-up examination after completed treatment for conditions other than malignant neoplasm: Secondary | ICD-10-CM

## 2024-01-15 MED ORDER — FERROUS SULFATE 325 (65 FE) MG PO TABS
325.0000 mg | ORAL_TABLET | Freq: Every day | ORAL | 2 refills | Status: DC
Start: 2024-01-15 — End: 2024-06-09
  Filled 2024-01-15: qty 90, 90d supply, fill #0

## 2024-01-15 MED ORDER — TRUE METRIX BLOOD GLUCOSE TEST VI STRP
ORAL_STRIP | 12 refills | Status: AC
  Filled 2024-01-15: qty 100, 25d supply, fill #0
  Filled 2024-04-15: qty 100, 25d supply, fill #1
  Filled 2024-08-05: qty 100, 25d supply, fill #2

## 2024-01-15 NOTE — Progress Notes (Signed)
 Assessment & Plan:  Rebecca Sharp was seen today for follow-up.  Diagnoses and all orders for this visit:  Hospital discharge follow-up  Essential hypertension -     CMP14+EGFR  Prediabetes -     Hemoglobin A1c -     CMP14+EGFR  Hypercholesterolemia -     Lipid panel  Type 2 diabetes mellitus with hyperglycemia, without long-term current use of insulin (HCC) -     glucose blood (TRUE METRIX BLOOD GLUCOSE TEST) test strip; Use as instructed  Anemia, unspecified type -     ferrous sulfate (FEROSUL) 325 (65 FE) MG tablet; Take 1 tablet (325 mg total) by mouth daily with breakfast.    Patient has been counseled on age-appropriate routine health concerns for screening and prevention. These are reviewed and up-to-date. Referrals have been placed accordingly. Immunizations are up-to-date or declined.    Subjective:   Chief Complaint  Patient presents with   Follow-up    Urgent care visit.    Rebecca Sharp 53 y.o. female presents to office today for urgent care follow up.  [name] Test Rebecca Sharp was seen at the urgent care on December 16, 2023 with complaints of left eye redness and drainage x 1 day and sore throat x 10 days.  Swab was negative for strep.  She was discharged home with erythromycin for left eye conjunctivitis.     Today she reports complete resolution of all symptoms and has no questions or concerns.    VRI was used to communicate directly with patient for the entire encounter including providing detailed patient instructions.     Review of Systems  Constitutional:  Negative for fever, malaise/fatigue and weight loss.  HENT: Negative.  Negative for nosebleeds.   Eyes: Negative.  Negative for blurred vision, double vision and photophobia.  Respiratory: Negative.  Negative for cough and shortness of breath.   Cardiovascular: Negative.  Negative for chest pain, palpitations and leg swelling.  Gastrointestinal: Negative.  Negative for heartburn, nausea and  vomiting.  Musculoskeletal: Negative.  Negative for myalgias.  Neurological: Negative.  Negative for dizziness, focal weakness, seizures and headaches.  Psychiatric/Behavioral: Negative.  Negative for suicidal ideas.     Past Medical History:  Diagnosis Date   AMA (advanced maternal age) multigravida 35+    Anemia    Ganglion cyst 01/25/2017   Left ankle   Kidney stones 2013   With pregnancy    Language barrier    Obese    Obesity 01/25/2017   Pregnancy induced hypertension    at end of last 2 pregnancies    Past Surgical History:  Procedure Laterality Date   NO PAST SURGERIES      Family History  Problem Relation Age of Onset   Diabetes Father    Hypertension Father    Breast cancer Neg Hx     Social History Reviewed with no changes to be made today.   Outpatient Medications Prior to Visit  Medication Sig Dispense Refill   betamethasone dipropionate 0.05 % cream APPLY TOPICALLY 2 (TWO) TIMES DAILY. 60 g 1   Blood Glucose Monitoring Suppl (TRUE METRIX METER) w/Device KIT 1 each by Does not apply route 2 (two) times daily. 1 kit 0   lisinopril (ZESTRIL) 5 MG tablet Take 1 tablet (5 mg total) by mouth daily. 90 tablet 1   metFORMIN (GLUCOPHAGE) 500 MG tablet Take 1 tablet (500 mg total) by mouth 2 (two) times daily with a meal. 180 tablet 1   nystatin (MYCOSTATIN/NYSTOP) powder  Apply 1 Application topically 3 (three) times daily. 30 g 2   TRUEplus Lancets 28G MISC USE AS DIRECTED 2 (TWO) TIMES DAILY. 100 each 12   ferrous sulfate (FEROSUL) 325 (65 FE) MG tablet Take 1 tablet (325 mg total) by mouth daily with breakfast. 90 tablet 2   glucose blood (TRUE METRIX BLOOD GLUCOSE TEST) test strip Use as instructed 100 each 12   erythromycin ophthalmic ointment Place a 1/2 inch ribbon of ointment into the left lower eyelid 4x daily for 5 days. (Patient not taking: Reported on 01/15/2024) 3.5 g 0   No facility-administered medications prior to visit.    No Known Allergies      Objective:    BP 114/71 (BP Location: Left Arm, Patient Position: Sitting, Cuff Size: Normal)   Pulse 68   Resp 20   Ht 4\' 11"  (1.499 m)   Wt 182 lb 6.4 oz (82.7 kg)   SpO2 100%   BMI 36.84 kg/m  Wt Readings from Last 3 Encounters:  01/15/24 182 lb 6.4 oz (82.7 kg)  12/16/23 190 lb (86.2 kg)  10/01/23 183 lb 14.4 oz (83.4 kg)    Physical Exam Vitals and nursing note reviewed.  Constitutional:      Appearance: She is well-developed.  HENT:     Head: Normocephalic and atraumatic.  Cardiovascular:     Rate and Rhythm: Normal rate and regular rhythm.     Heart sounds: Normal heart sounds. No murmur heard.    No friction rub. No gallop.  Pulmonary:     Effort: Pulmonary effort is normal. No tachypnea or respiratory distress.     Breath sounds: Normal breath sounds. No decreased breath sounds, wheezing, rhonchi or rales.  Chest:     Chest wall: No tenderness.  Abdominal:     General: Bowel sounds are normal.     Palpations: Abdomen is soft.  Musculoskeletal:        General: Normal range of motion.     Cervical back: Normal range of motion.  Skin:    General: Skin is warm and dry.  Neurological:     Mental Status: She is alert and oriented to person, place, and time.     Coordination: Coordination normal.  Psychiatric:        Behavior: Behavior normal. Behavior is cooperative.        Thought Content: Thought content normal.        Judgment: Judgment normal.          Patient has been counseled extensively about nutrition and exercise as well as the importance of adherence with medications and regular follow-up. The patient was given clear instructions to go to ER or return to medical center if symptoms don't improve, worsen or new problems develop. The patient verbalized understanding.   Follow-up: Return in about 5 months (around 06/09/2024) for physical.   Claiborne Rigg, FNP-BC [facility] and [facility] [place], [place] [phone]    01/15/2024, 1:25 PM

## 2024-01-16 ENCOUNTER — Encounter: Payer: Self-pay | Admitting: Nurse Practitioner

## 2024-01-16 LAB — LIPID PANEL
Chol/HDL Ratio: 5.3 ratio — ABNORMAL HIGH (ref 0.0–4.4)
Cholesterol, Total: 222 mg/dL — ABNORMAL HIGH (ref 100–199)
HDL: 42 mg/dL (ref >39)
LDL Chol Calc (NIH): 135 mg/dL — ABNORMAL HIGH (ref 0–99)
Triglycerides: 250 mg/dL — ABNORMAL HIGH (ref 0–149)
VLDL Cholesterol Cal: 45 mg/dL — ABNORMAL HIGH (ref 5–40)

## 2024-01-16 LAB — CMP14+EGFR
ALT: 15 IU/L (ref 0–32)
AST: 13 IU/L (ref 0–40)
Albumin: 4.3 g/dL (ref 3.8–4.9)
Alkaline Phosphatase: 78 IU/L (ref 44–121)
BUN/Creatinine Ratio: 19 (ref 9–23)
BUN: 13 mg/dL (ref 6–24)
Bilirubin Total: 0.3 mg/dL (ref 0.0–1.2)
CO2: 21 mmol/L (ref 20–29)
Calcium: 9.3 mg/dL (ref 8.7–10.2)
Chloride: 103 mmol/L (ref 96–106)
Creatinine, Ser: 0.68 mg/dL (ref 0.57–1.00)
Globulin, Total: 3.2 g/dL (ref 1.5–4.5)
Glucose: 90 mg/dL (ref 70–99)
Potassium: 4.9 mmol/L (ref 3.5–5.2)
Sodium: 136 mmol/L (ref 134–144)
Total Protein: 7.5 g/dL (ref 6.0–8.5)
eGFR: 105 mL/min/{1.73_m2} (ref >59)

## 2024-01-16 LAB — HEMOGLOBIN A1C
Est. average glucose Bld gHb Est-mCnc: 114 mg/dL
Hgb A1c MFr Bld: 5.6 % (ref 4.8–5.6)

## 2024-01-23 ENCOUNTER — Ambulatory Visit: Payer: Self-pay

## 2024-01-23 NOTE — Telephone Encounter (Signed)
 Copied from CRM [id number]. Topic: Clinical - Medical Advice >> Jan 23, 2024  3:24 PM Elle L wrote: Reason for CRM: The patient was returning a call from [facility] regarding how much fish oil to take but she was assisting other patients at this time. We spoke with interpreter Byrd Hesselbach ID [phone]. The patient's call back number is [phone].

## 2024-01-23 NOTE — Telephone Encounter (Signed)
Call unanswered by patient.

## 2024-01-23 NOTE — Telephone Encounter (Signed)
 Copied from CRM [id number]. Topic: Clinical - Lab/Test Results >> Jan 23, 2024 12:06 PM Elle L wrote: Reason for CRM: The patient called back regarding her lab work. I read the note to her verbatim. We spoke with [facility] interpreter ID [phone]. However, she had more questions regarding how many milligrams of fish oil she should be taking. Her call back number is [phone].

## 2024-01-25 ENCOUNTER — Telehealth: Payer: Self-pay

## 2024-01-25 NOTE — Telephone Encounter (Signed)
 Called using interpreter services. No answer on primary number listed and no voicemail was set up. Called other number listed on contacts who is "Donald Pore" , He was very rude, complained because I called him, and called me "stupid lady" through the interpreter. Suggested we Do not use his number again.

## 2024-01-25 NOTE — Telephone Encounter (Signed)
 Rebecca Sharp, patient has been approved to receive free drug from the manufacturer. She will be scheduled as soon as possible.  Patient is receiving Advance Medication - Supplied Externally. Medication: Feraheme Manufacture: [employer] Approval Dates: Approved from 01/21/2024 until 01/20/2025. ID: [id number] Reason: Self Pay First DOS: Future

## 2024-02-05 ENCOUNTER — Ambulatory Visit: Payer: Self-pay

## 2024-02-05 VITALS — BP 103/70 | HR 77 | Temp 98.0°F | Resp 18 | Ht 59.0 in | Wt 183.8 lb

## 2024-02-05 DIAGNOSIS — D508 Other iron deficiency anemias: Secondary | ICD-10-CM

## 2024-02-05 DIAGNOSIS — D509 Iron deficiency anemia, unspecified: Secondary | ICD-10-CM

## 2024-02-05 MED ORDER — SODIUM CHLORIDE 0.9 % IV SOLN
510.0000 mg | Freq: Once | INTRAVENOUS | Status: AC
  Administered 2024-02-05: 510 mg via INTRAVENOUS
  Filled 2024-02-05: qty 17

## 2024-02-05 NOTE — Progress Notes (Signed)
 Diagnosis: Iron Deficiency Anemia  Provider:  Praveen Mannam MD  Procedure: IV Infusion  IV Type: Peripheral, IV Location: L Forearm  Feraheme  (Ferumoxytol ), Dose: 510 mg  Infusion Start Time: 1432  Infusion Stop Time: 1449  Post Infusion IV Care: Observation period completed and Peripheral IV Discontinued  Discharge: Condition: Good, Destination: Home . AVS Provided  Performed by:  Sabas Frett, RN

## 2024-02-05 NOTE — Patient Instructions (Signed)
 Ferumoxytol Injection Qu es este medicamento? El FERUMOXITOL trata los niveles bajos de hierro en el cuerpo (anemia por deficiencia de hierro). El hierro es un mineral que cumple una funcin importante en la produccin de glbulos rojos, que llevan el oxgeno de los pulmones al resto del cuerpo. Este medicamento puede ser utilizado para otros usos; si tiene alguna pregunta consulte con su proveedor de atencin mdica o con su farmacutico. MARCAS COMUNES: Feraheme Qu le debo informar a mi profesional de la salud antes de tomar este medicamento? Necesitan saber si usted presenta alguno de los [employer] o situaciones: Anemia no causada por niveles bajos de hierro Artist de hierro en la sangre Tiene programada la realizacin de una resonancia magntica Una reaccin alrgica o inusual al hierro, a otros medicamentos, alimentos, colorantes o conservantes Si est embarazada o buscando quedar embarazada Si est amamantando a un beb Cmo debo Visual merchandiser medicamento? Este medicamento se inyecta en una vena. Su equipo de atencin lo [employer] en un hospital o en un entorno clnico. Hable con su equipo de atencin sobre el uso de este medicamento en nios. Puede requerir atencin especial. Sobredosis: Pngase en contacto inmediatamente con un centro toxicolgico o una sala de urgencia si usted cree que haya tomado demasiado medicamento.<br>ATENCIN: [employer] es solo para usted. No comparta este medicamento con nadie. Qu sucede si me olvido de una dosis? Es importante no olvidar ninguna dosis. Llame a su equipo de atencin si no puede asistir a una cita. Qu puede interactuar con este medicamento? Otros productos con hierro Puede ser que esta lista no menciona todas las posibles interacciones. Informe a su profesional de [employer] de [employer] productos a base de hierbas, medicamentos de [place] o suplementos nutritivos que est tomando. Si usted fuma, consume  bebidas alcohlicas o si utiliza drogas ilegales, indqueselo tambin a su profesional de [employer]. Algunas sustancias pueden interactuar con su medicamento. A qu debo estar atento al usar [employer]? Visite a su equipo de atencin para que revise su evolucin peridicamente. Informe a su equipo de atencin si los sntomas no comienzan a mejorar o si empeoran. Usted podra necesitar realizarse [employer] de sangre mientras est usando [place]. Es posible que deba comer ms alimentos que contienen hierro. Hable con su equipo de atencin. Los alimentos que contienen hierro incluyen granos o cereales enteros, frutas secas, legumbres, guisantes, vegetales de hojas verdes y asaduras (hgado, rin). Qu efectos secundarios puedo tener al [employer] este medicamento? Efectos secundarios que debe informar a su equipo de atencin tan pronto como sea posible: Reacciones alrgicas: erupcin cutnea, comezn/picazn, urticaria, hinchazn de la cara, los labios, la lengua o la garganta Presin arterial baja: mareo, sensacin de desmayo o aturdimiento, visin borrosa Falta de aliento Efectos secundarios que generalmente no requieren atencin mdica (debe informarlos a su equipo de atencin si persisten o si son molestos): Enrojecimiento Dolor de Public house manager en las articulaciones Dolor muscular Therapist, sports, enrojecimiento o Marketing executive de la inyeccin Puede ser que esta lista no menciona todos los posibles efectos secundarios. Comunquese a su mdico por asesoramiento mdico [employer]. Usted puede informar los efectos secundarios a la FDA por telfono al 1-800-FDA-1088. Dnde debo guardar mi medicina? Este medicamento se administra en hospitales o clnicas. No se guarda en su casa. ATENCIN: Este folleto es un resumen. Puede ser que no cubra toda la posible informacin. Si usted tiene preguntas acerca de esta medicina, consulte con su mdico, su farmacutico o  su profesional de Radiographer, therapeutic.  2024 Elsevier/Gold Standard (2023-07-30 00:00:00)

## 2024-02-12 ENCOUNTER — Ambulatory Visit: Payer: Self-pay | Admitting: *Deleted

## 2024-02-12 VITALS — BP 125/82 | HR 79 | Temp 97.9°F | Resp 20 | Ht 61.0 in | Wt 184.8 lb

## 2024-02-12 DIAGNOSIS — D508 Other iron deficiency anemias: Secondary | ICD-10-CM

## 2024-02-12 MED ORDER — SODIUM CHLORIDE 0.9 % IV SOLN
510.0000 mg | Freq: Once | INTRAVENOUS | Status: DC
  Filled 2024-02-12: qty 17

## 2024-02-12 NOTE — Progress Notes (Signed)
 Diagnosis: Iron Deficiency Anemia  Provider:  Mannam, Praveen MD  Procedure: IV Infusion  IV Type: Peripheral, IV Location: Unsuccessful x 2 IV starts  Feraheme  (Ferumoxytol ),Dose: 510 mg  Infusion Start Time: not given  Infusion Stop Time: not given  Post Infusion IV Care:  Patient asked to be rescheduled , refused another IV stick at this time  Discharge: Condition: Good, Destination: Home . AVS Provided  Performed by:  Mayme Spearman, RN

## 2024-02-19 ENCOUNTER — Ambulatory Visit: Payer: Self-pay

## 2024-02-19 VITALS — BP 116/71 | HR 59 | Temp 97.8°F | Resp 20 | Ht 60.0 in | Wt 183.8 lb

## 2024-02-19 DIAGNOSIS — D508 Other iron deficiency anemias: Secondary | ICD-10-CM

## 2024-02-19 DIAGNOSIS — D509 Iron deficiency anemia, unspecified: Secondary | ICD-10-CM

## 2024-02-19 MED ORDER — SODIUM CHLORIDE 0.9 % IV SOLN
510.0000 mg | Freq: Once | INTRAVENOUS | Status: AC
  Administered 2024-02-19: 510 mg via INTRAVENOUS
  Filled 2024-02-19: qty 17

## 2024-02-19 NOTE — Progress Notes (Signed)
 Diagnosis: Acute Anemia  Provider:  Phyllis Breeze MD  Procedure: IV Infusion  IV Type: Peripheral, IV Location: L Antecubital  Feraheme  (Ferumoxytol ), Dose: 510 mg  Infusion Start Time: 1024  Infusion Stop Time: 1043  Post Infusion IV Care: Observation period completed and Peripheral IV Discontinued  Discharge: Condition: Good, Destination: Home . AVS Declined  Performed by:  Shirly Dow, RN

## 2024-03-08 ENCOUNTER — Other Ambulatory Visit: Payer: Self-pay

## 2024-03-08 ENCOUNTER — Emergency Department (HOSPITAL_COMMUNITY): Payer: Self-pay

## 2024-03-08 ENCOUNTER — Emergency Department (HOSPITAL_COMMUNITY)
Admission: EM | Admit: 2024-03-08 | Discharge: 2024-03-08 | Disposition: A | Discharge status: Home / Self Care | Payer: Self-pay | Attending: Emergency Medicine | Admitting: Emergency Medicine

## 2024-03-08 ENCOUNTER — Encounter (HOSPITAL_COMMUNITY): Payer: Self-pay

## 2024-03-08 DIAGNOSIS — I10 Essential (primary) hypertension: Secondary | ICD-10-CM | POA: Insufficient documentation

## 2024-03-08 DIAGNOSIS — Z79899 Other long term (current) drug therapy: Secondary | ICD-10-CM | POA: Insufficient documentation

## 2024-03-08 DIAGNOSIS — M5416 Radiculopathy, lumbar region: Secondary | ICD-10-CM

## 2024-03-08 DIAGNOSIS — R109 Unspecified abdominal pain: Secondary | ICD-10-CM | POA: Insufficient documentation

## 2024-03-08 DIAGNOSIS — M545 Low back pain, unspecified: Secondary | ICD-10-CM | POA: Insufficient documentation

## 2024-03-08 LAB — BASIC METABOLIC PANEL WITH GFR
Anion gap: 8 (ref 5–15)
BUN: 19 mg/dL (ref 6–20)
CO2: 22 mmol/L (ref 22–32)
Calcium: 8.9 mg/dL (ref 8.9–10.3)
Chloride: 106 mmol/L (ref 98–111)
Creatinine, Ser: 0.69 mg/dL (ref 0.44–1.00)
GFR, Estimated: >60 mL/min (ref >60)
Glucose, Bld: 119 mg/dL — ABNORMAL HIGH (ref 70–99)
Potassium: 4.1 mmol/L (ref 3.5–5.1)
Sodium: 136 mmol/L (ref 135–145)

## 2024-03-08 LAB — URINALYSIS, ROUTINE W REFLEX MICROSCOPIC
Bacteria, UA: NONE SEEN
Bilirubin Urine: NEGATIVE
Glucose, UA: NEGATIVE mg/dL
Hgb urine dipstick: NEGATIVE
Ketones, ur: NEGATIVE mg/dL
Nitrite: NEGATIVE
Protein, ur: 30 mg/dL — AB
Specific Gravity, Urine: 1.024 (ref 1.005–1.030)
pH: 5 (ref 5.0–8.0)

## 2024-03-08 LAB — CBC
HCT: 38.7 % (ref 36.0–46.0)
Hemoglobin: 12.5 g/dL (ref 12.0–15.0)
MCH: 29.1 pg (ref 26.0–34.0)
MCHC: 32.3 g/dL (ref 30.0–36.0)
MCV: 90.2 fL (ref 80.0–100.0)
Platelets: 247 10*3/uL (ref 150–400)
RBC: 4.29 MIL/uL (ref 3.87–5.11)
RDW: 15.9 % — ABNORMAL HIGH (ref 11.5–15.5)
WBC: 6.4 10*3/uL (ref 4.0–10.5)
nRBC: 0 % (ref 0.0–0.2)

## 2024-03-08 LAB — HCG, SERUM, QUALITATIVE: Preg, Serum: NEGATIVE

## 2024-03-08 MED ORDER — METHOCARBAMOL 500 MG PO TABS
500.0000 mg | ORAL_TABLET | Freq: Once | ORAL | Status: AC
  Administered 2024-03-08: 500 mg via ORAL
  Filled 2024-03-08: qty 1

## 2024-03-08 MED ORDER — KETOROLAC TROMETHAMINE 15 MG/ML IJ SOLN
15.0000 mg | Freq: Once | INTRAMUSCULAR | Status: AC
  Administered 2024-03-08: 15 mg via INTRAMUSCULAR
  Filled 2024-03-08: qty 1

## 2024-03-08 MED ORDER — PREDNISONE 20 MG PO TABS
40.0000 mg | ORAL_TABLET | Freq: Every day | ORAL | 0 refills | Status: DC
  Filled 2024-03-08: qty 6, 3d supply, fill #0

## 2024-03-08 MED ORDER — LIDOCAINE 5 % EX PTCH
1.0000 | MEDICATED_PATCH | CUTANEOUS | Status: DC
  Administered 2024-03-08: 1 via TRANSDERMAL
  Filled 2024-03-08: qty 1

## 2024-03-08 MED ORDER — METHOCARBAMOL 500 MG PO TABS
500.0000 mg | ORAL_TABLET | Freq: Two times a day (BID) | ORAL | 0 refills | Status: DC
  Filled 2024-03-08: qty 20, 10d supply, fill #0

## 2024-03-08 MED ORDER — IBUPROFEN 600 MG PO TABS
600.0000 mg | ORAL_TABLET | Freq: Four times a day (QID) | ORAL | 0 refills | Status: DC | PRN
  Filled 2024-03-08: qty 30, 8d supply, fill #0

## 2024-03-08 NOTE — Discharge Instructions (Signed)
 As we discussed, I do not see any evidence of kidney stones in the urinary tract.  You do have some stones inside the kidneys but this should not cause pain.  You can take citric acid  which will help prevent kidney stones in the future.  You can get this from lemons and oranges.  Please take the medications as prescribed here.  You may return to the emergency department for any worsening symptoms.

## 2024-03-08 NOTE — ED Provider Notes (Signed)
 [place] EMERGENCY DEPARTMENT AT [facility] Provider Note   CSN: [id number] Arrival date & time: 03/08/24  1129     History  Chief Complaint  Patient presents with   Flank Pain    Pt endorses constant back pain that began Thursday. Endorses increased urinary frequency, denies blood in urine, pt has hx of stones on L side during pregnancy 12 years ago, but no stones have ever passed.     Rebecca Sharp is a 53 y.o. female.  Patient with past medical history of kidney stone, hypertension, GERD presented to emergency room with left-sided flank pain.  Patient reports that this started 3 days ago and has been constant.  She notes the pain is worse when moving.  Has not had any associated fever urinary symptoms nausea vomiting or abdominal pain.  She reports she has had similar symptoms in the past and diagnosed with kidney stone.  No red flag symptoms associated with back pain.   Flank Pain       Home Medications Prior to Admission medications   Medication Sig Start Date End Date Taking? Authorizing Provider  betamethasone  dipropionate 0.05 % cream APPLY TOPICALLY 2 (TWO) TIMES DAILY. 04/03/23   Newlin, Enobong, MD  Blood Glucose Monitoring Suppl (TRUE METRIX METER) w/Device KIT 1 each by Does not apply route 2 (two) times daily. 08/21/19   Hassie Lint, PA-C  erythromycin  ophthalmic ointment Place a 1/2 inch ribbon of ointment into the left lower eyelid 4x daily for 5 days. Patient not taking: Reported on 01/15/2024 12/16/23   Jameson Mcburney, FNP  ferrous sulfate  (FEROSUL) 325 (65 FE) MG tablet Take 1 tablet (325 mg total) by mouth daily with breakfast. 01/15/24 01/14/25  Fleming, Zelda W, NP  glucose blood (TRUE METRIX BLOOD GLUCOSE TEST) test strip Use as instructed 01/15/24   Fleming, Zelda W, NP  lisinopril  (ZESTRIL ) 5 MG tablet Take 1 tablet (5 mg total) by mouth daily. 06/05/23 01/24/24  Fleming, Zelda W, NP  metFORMIN  (GLUCOPHAGE ) 500 MG tablet Take 1 tablet  (500 mg total) by mouth 2 (two) times daily with a meal. 06/05/23 06/04/24  Fleming, Zelda W, NP  nystatin  (MYCOSTATIN /NYSTOP ) powder Apply 1 Application topically 3 (three) times daily. 08/09/23   Baldomero Bone A, NP  TRUEplus Lancets 28G MISC USE AS DIRECTED 2 (TWO) TIMES DAILY. 10/23/22   Newlin, Enobong, MD      Allergies    Patient has no known allergies.    Review of Systems   Review of Systems  Genitourinary:  Positive for flank pain.    Physical Exam Updated Vital Signs BP 125/79   Pulse 75   Temp 98.4 F (36.9 C) (Oral)   Resp 16   Ht 5' (1.524 m)   Wt 81.6 kg   SpO2 99%   BMI 35.15 kg/m  Physical Exam Vitals and nursing note reviewed.  Constitutional:      General: She is not in acute distress.    Appearance: She is not toxic-appearing.  HENT:     Head: Normocephalic and atraumatic.  Eyes:     General: No scleral icterus.    Conjunctiva/sclera: Conjunctivae normal.  Cardiovascular:     Rate and Rhythm: Normal rate and regular rhythm.     Pulses: Normal pulses.     Heart sounds: Normal heart sounds.  Pulmonary:     Effort: Pulmonary effort is normal. No respiratory distress.     Breath sounds: Normal breath sounds.  Abdominal:  General: Abdomen is flat. Bowel sounds are normal. There is no distension.     Palpations: Abdomen is soft. There is no mass.     Tenderness: There is no abdominal tenderness. There is no right CVA tenderness or left CVA tenderness.  Musculoskeletal:     Right lower leg: No edema.     Left lower leg: No edema.     Comments: No producible tenderness on exam.  No lumbar midline tenderness step-off or deformity.  No radicular symptoms.  Skin:    General: Skin is warm and dry.     Findings: No lesion.  Neurological:     General: No focal deficit present.     Mental Status: She is alert and oriented to person, place, and time. Mental status is at baseline.     ED Results / Procedures / Treatments   Labs (all labs ordered are  listed, but only abnormal results are displayed) Labs Reviewed  URINALYSIS, ROUTINE W REFLEX MICROSCOPIC - Abnormal; Notable for the following components:      Result Value   APPearance HAZY (*)    Protein, ur 30 (*)    Leukocytes,Ua SMALL (*)    All other components within normal limits  BASIC METABOLIC PANEL WITH GFR - Abnormal; Notable for the following components:   Glucose, Bld 119 (*)    All other components within normal limits  CBC - Abnormal; Notable for the following components:   RDW 15.9 (*)    All other components within normal limits  HCG, SERUM, QUALITATIVE    EKG None  Radiology No results found.  Procedures Procedures    Medications Ordered in ED Medications  lidocaine  (LIDODERM ) 5 % 1 patch (1 patch Transdermal Patch Applied 03/08/24 1339)  ketorolac  (TORADOL ) 15 MG/ML injection 15 mg (15 mg Intramuscular Given 03/08/24 1339)  methocarbamol  (ROBAXIN ) tablet 500 mg (500 mg Oral Given 03/08/24 1339)    ED Course/ Medical Decision Making/ A&P                                 Medical Decision Making Amount and/or Complexity of Data Reviewed Labs: ordered. Radiology: ordered.  Risk Prescription drug management.   Cassius Client 53 y.o. presented today for back pain. Working Ddx: MSK in nature, fracture, epidural hematoma/abscess, cauda equina syndrome, spinal stenosis, spinal malignancy, discitis, spinal infection, spondylitises/ spondylosis, conus medullaris, DDD of the back.  R/o DDx: Cauda equina syndrome and additional dx are less likely than current impression due to history of present illness, physical exam, labs/imaging findings. No focal neurological deficits, no loss of bowel or bladder control.  Denies fever, night sweats, weight loss, h/o cancer, IVDU.  PMHX: Kidney stone      Labs: CBC without leukocytosis no significant anemia.  BMP with normal kidney function and no electrolyte abnormality.  Pregnancy test negative.  UA with small  leukocytes and no bacteria. Denies specific urinary symptoms.   Imaging: CT renal study pending  Problem List / ED Course / Critical interventions / Medication management  Patient reporting to emergency room with complaint of left low back pain.  It is not associated with any radicular symptoms.  Not associated with abdominal pain nausea vomiting diarrhea or fever.  She has no red flag symptoms associated with her back pain, do not feel she needs emergent MRI.  She has no recent injury trauma or fall thus I do not feel that x-ray needed  at this time.  At this point I feel this is most likely muscular intermittent in nature however patient reports this feels like prior kidney stone.  Will obtain renal study to rule out any acute intra-abdominal pathology as well as kidney stone.  Labs are overall reassuring without any elevation in creatinine.  I ordered medication including toradol , robaxin , lidoderm  patch.  Reevaluation of the patient after these medicines showed that the patient improved Patients vitals assessed. Upon arrival patient is  hemodynamically stable.  I have reviewed the patients home medicines and have made adjustments as needed   Plan: Signed off to oncoming ED PA-c at shift change pending imaging to rule out stone, acute intra-abdominal pathology. If negative feel appropriate to d/c home with muscle relaxer, steroid taper, supportive treatment.          Final Clinical Impression(s) / ED Diagnoses Final diagnoses:  Left flank pain    Rx / DC Orders ED Discharge Orders     None         Eudora Heron, PA-C 03/08/24 1511    Iva Mariner, MD 03/08/24 1525

## 2024-03-08 NOTE — ED Provider Notes (Signed)
  Physical Exam  BP (!) 108/53   Pulse 74   Temp (!) 97.4 F (36.3 C) (Oral)   Resp 17   Ht 5' (1.524 m)   Wt 81.6 kg   SpO2 99%   BMI 35.15 kg/m   Physical Exam Vitals and nursing note reviewed.  Constitutional:      Appearance: Normal appearance.  HENT:     Head: Normocephalic and atraumatic.  Eyes:     General:        Right eye: No discharge.        Left eye: No discharge.     Conjunctiva/sclera: Conjunctivae normal.  Pulmonary:     Effort: Pulmonary effort is normal.  Musculoskeletal:     Comments: Left lower lumbar tenderness.  No midline tenderness.  No significant flank tenderness.  Skin:    General: Skin is warm and dry.     Findings: No rash.  Neurological:     General: No focal deficit present.     Mental Status: She is alert.  Psychiatric:        Mood and Affect: Mood normal.        Behavior: Behavior normal.     Procedures  Procedures  ED Course / MDM   Clinical Course as of 03/08/24 1628  Sat Mar 08, 2024  1627 CBC(!) Negative. [CF]  1627 Basic metabolic panel(!) Negative. [CF]  1627 Urinalysis, Routine w reflex microscopic -Urine, Clean Catch(!) Negative. [CF]  1627 CT Renal Stone Study There is evidence of urolithiasis without any evidence of ureterolithiasis.  I personally ordered interpreted the study.  I do agree with radiology interpretation. [CF]    Clinical Course User Index [CF] Darletta Ehrich, PA-C   Medical Decision Making Amount and/or Complexity of Data Reviewed Labs: ordered. Radiology: ordered.  Risk Prescription drug management.   Accepted handoff at shift change from Barrett PA-C. Please see prior provider note for more detail.   Briefly: Patient is 53 y.o. who presents to the emerged from today for further evaluation of left lower back pain.  She states this feels similar to kidney stones in the past.  Denies any urinary symptoms.  I used interpreter for this encounter.  Interpreter [phone].  DDX: concern for  kidney stone versus musculoskeletal pain  Plan: Discharge home with steroids, anti-inflammatories, and Robaxin  for musculoskeletal pain.  CT was negative for any ureterolithiasis.  Patient agreeable with plan.  All question concerns addressed.        Darletta Ehrich, PA-C 03/08/24 1628    Teddi Favors, DO 03/13/24 [phone]

## 2024-03-09 ENCOUNTER — Other Ambulatory Visit: Payer: Self-pay

## 2024-03-09 ENCOUNTER — Encounter: Payer: Self-pay | Admitting: Physician Assistant

## 2024-03-13 ENCOUNTER — Other Ambulatory Visit: Payer: Self-pay

## 2024-04-01 ENCOUNTER — Other Ambulatory Visit: Payer: Self-pay

## 2024-04-01 ENCOUNTER — Inpatient Hospital Stay: Payer: Self-pay | Attending: Physician Assistant

## 2024-04-01 ENCOUNTER — Inpatient Hospital Stay: Payer: Self-pay | Admitting: Physician Assistant

## 2024-04-01 VITALS — BP 125/79 | HR 87 | Temp 97.3°F | Resp 17 | Ht 60.0 in | Wt 184.7 lb

## 2024-04-01 DIAGNOSIS — D508 Other iron deficiency anemias: Secondary | ICD-10-CM

## 2024-04-01 DIAGNOSIS — D649 Anemia, unspecified: Secondary | ICD-10-CM

## 2024-04-01 DIAGNOSIS — D509 Iron deficiency anemia, unspecified: Secondary | ICD-10-CM | POA: Insufficient documentation

## 2024-04-01 LAB — IRON AND IRON BINDING CAPACITY (CC-WL,HP ONLY)
Iron: 95 ug/dL (ref 28–170)
Saturation Ratios: 31 % (ref 10.4–31.8)
TIBC: 304 ug/dL (ref 250–450)
UIBC: 209 ug/dL (ref 148–442)

## 2024-04-01 LAB — CBC WITH DIFFERENTIAL (CANCER CENTER ONLY)
Abs Immature Granulocytes: 0.03 10*3/uL (ref 0.00–0.07)
Basophils Absolute: 0 10*3/uL (ref 0.0–0.1)
Basophils Relative: 1 %
Eosinophils Absolute: 0.3 10*3/uL (ref 0.0–0.5)
Eosinophils Relative: 4 %
HCT: 34.2 % — ABNORMAL LOW (ref 36.0–46.0)
Hemoglobin: 11.5 g/dL — ABNORMAL LOW (ref 12.0–15.0)
Immature Granulocytes: 0 %
Lymphocytes Relative: 22 %
Lymphs Abs: 1.5 10*3/uL (ref 0.7–4.0)
MCH: 29.6 pg (ref 26.0–34.0)
MCHC: 33.6 g/dL (ref 30.0–36.0)
MCV: 87.9 fL (ref 80.0–100.0)
Monocytes Absolute: 0.4 10*3/uL (ref 0.1–1.0)
Monocytes Relative: 5 %
Neutro Abs: 4.8 10*3/uL (ref 1.7–7.7)
Neutrophils Relative %: 68 %
Platelet Count: 197 10*3/uL (ref 150–400)
RBC: 3.89 MIL/uL (ref 3.87–5.11)
RDW: 15.5 % (ref 11.5–15.5)
WBC Count: 7 10*3/uL (ref 4.0–10.5)
nRBC: 0 % (ref 0.0–0.2)

## 2024-04-01 LAB — FERRITIN: Ferritin: 375 ng/mL — ABNORMAL HIGH (ref 11–307)

## 2024-04-01 NOTE — Progress Notes (Signed)
 [facility] [phone]   [phone]  PROGRESS NOTE  Patient Care Team: Collins Dean, NP as PCP - General (Nurse Practitioner)  CHIEF COMPLAINTS/PURPOSE OF CONSULTATION:  Iron deficiency anemia  TREATMENT HISTORY: --Currently on ferrous sulfate  325 mg once daily --Received IV monoferric  1000 mg x 1 dose on 07/10/2022 --Received IV monoferric  1000 mg x 1 dose on 01/01/2023 --Received IV feraheme  510 mg x 2 doses from 02/05/2024-02/19/2024.    HISTORY OF PRESENTING ILLNESS:  Rebecca Sharp 53 y.o. female returns for a follow up visit for iron deficiency anemia.  She is accompanied by a Engineer, structural. She was last seen on 10/01/2023 by Lacie Burton NP and the interim, she received IV feraheme  x 2 doses.   On exam today, Rebecca Sharp reports her energy levels have improved after IV iron. She continues to have monthly menstrual cycles with the last cycle only lasting one day with very heavy bleeding. She has no other signs of bleeding or bruising. She denies fevers, chills, sweats, shortness of breath, chest pain, cough, nausea, vomiting, diarrhea, constipation, headaches or dizziness.   Rest of ROS is below.  MEDICAL HISTORY:  Past Medical History:  Diagnosis Date   AMA (advanced maternal age) multigravida 35+    Anemia    Ganglion cyst 01/25/2017   Left ankle   Kidney stones 2013   With pregnancy    Language barrier    Obese    Obesity 01/25/2017   Pregnancy induced hypertension    at end of last 2 pregnancies    SURGICAL HISTORY: Past Surgical History:  Procedure Laterality Date   NO PAST SURGERIES      SOCIAL HISTORY: Social History   Socioeconomic History   Marital status: Single    Spouse name: Not on file   Number of children: 3   Years of education: <8th grade   Highest education level: 6th grade  Occupational History   Occupation: Multimedia programmer: [employer]  Tobacco Use   Smoking status: Never   Smokeless  tobacco: Never  Vaping Use   Vaping status: Never Used  Substance and Sexual Activity   Alcohol use: No    Alcohol/week: 0.0 standard drinks of alcohol   Drug use: No   Sexual activity: Yes    Birth control/protection: Condom, None    Comment: No current boyfriend or spouse-not interested  Other Topics Concern   Not on file  Social History Narrative   Not on file   Social Drivers of Health   Financial Resource Strain: Not on file  Food Insecurity: No Food Insecurity (08/09/2023)   Hunger Vital Sign    Worried About Running Out of Food in the Last Year: Never true    Ran Out of Food in the Last Year: Never true  Transportation Needs: No Transportation Needs (06/15/2022)   PRAPARE - Administrator, Civil Service (Medical): No    Lack of Transportation (Non-Medical): No  Physical Activity: Not on file  Stress: Not on file  Social Connections: Not on file  Intimate Partner Violence: Not on file    FAMILY HISTORY: Family History  Problem Relation Age of Onset   Diabetes Father    Hypertension Father    Breast cancer Neg Hx     ALLERGIES:  has no known allergies.  MEDICATIONS:  Current Outpatient Medications  Medication Sig Dispense Refill   betamethasone  dipropionate 0.05 % cream APPLY TOPICALLY 2 (TWO) TIMES DAILY. 60 g  1   Blood Glucose Monitoring Suppl (TRUE METRIX METER) w/Device KIT 1 each by Does not apply route 2 (two) times daily. 1 kit 0   erythromycin  ophthalmic ointment Place a 1/2 inch ribbon of ointment into the left lower eyelid 4x daily for 5 days. (Patient not taking: Reported on 01/15/2024) 3.5 g 0   ferrous sulfate  (FEROSUL) 325 (65 FE) MG tablet Take 1 tablet (325 mg total) by mouth daily with breakfast. 90 tablet 2   glucose blood (TRUE METRIX BLOOD GLUCOSE TEST) test strip Use as instructed 100 each 12   ibuprofen  (ADVIL ) 600 MG tablet Take 1 tablet (600 mg total) by mouth every 6 (six) hours as needed. 30 tablet 0   lisinopril  (ZESTRIL ) 5 MG  tablet Take 1 tablet (5 mg total) by mouth daily. 90 tablet 1   metFORMIN  (GLUCOPHAGE ) 500 MG tablet Take 1 tablet (500 mg total) by mouth 2 (two) times daily with a meal. 180 tablet 1   methocarbamol  (ROBAXIN ) 500 MG tablet Take 1 tablet (500 mg total) by mouth 2 (two) times daily. 20 tablet 0   nystatin  (MYCOSTATIN /NYSTOP ) powder Apply 1 Application topically 3 (three) times daily. 30 g 2   predniSONE  (DELTASONE ) 20 MG tablet Take 2 tablets (40 mg total) by mouth daily. 6 tablet 0   TRUEplus Lancets 28G MISC USE AS DIRECTED 2 (TWO) TIMES DAILY. 100 each 12   No current facility-administered medications for this visit.    REVIEW OF SYSTEMS:   Constitutional: ( - ) fevers, ( - )  chills , ( - ) night sweats Eyes: ( - ) blurriness of vision, ( - ) double vision, ( - ) watery eyes Ears, nose, mouth, throat, and face: ( - ) mucositis, ( - ) sore throat Respiratory: ( - ) cough, ( - ) dyspnea, ( - ) wheezes Cardiovascular: ( - ) palpitation, ( - ) chest discomfort, ( - ) lower extremity swelling Gastrointestinal:  ( - ) nausea, ( - ) heartburn, ( - ) change in bowel habits Skin: ( - ) abnormal skin rashes Lymphatics: ( - ) new lymphadenopathy, ( - ) easy bruising Neurological: ( - ) numbness, ( - ) tingling, ( - ) new weaknesses Behavioral/Psych: ( - ) mood change, ( - ) new changes  All other systems were reviewed with the patient and are negative.  PHYSICAL EXAMINATION: ECOG PERFORMANCE STATUS: 0 - Asymptomatic  Vitals:   04/01/24 1021  BP: 125/79  Pulse: 87  Resp: 17  Temp: (!) 97.3 F (36.3 C)  SpO2: 98%   Filed Weights   04/01/24 1021  Weight: 184 lb 11.2 oz (83.8 kg)    GENERAL: well appearing female in NAD  SKIN: skin color, texture, turgor are normal, no rashes or significant lesions EYES: conjunctiva are pink and non-injected, sclera clear OROPHARYNX: no exudate, no erythema; lips, buccal mucosa, and tongue normal  LUNGS: clear to auscultation and percussion with  normal breathing effort HEART: regular rate & rhythm and no murmurs and no lower extremity edema Musculoskeletal: no cyanosis of digits and no clubbing  PSYCH: alert & oriented x 3, fluent speech NEURO: no focal motor/sensory deficits  LABORATORY DATA:  I have reviewed the data as listed    Latest Ref Rng & Units 04/01/2024   10:08 AM 03/08/2024   11:59 AM 12/31/2023   10:17 AM  CBC  WBC 4.0 - 10.5 K/uL 7.0  6.4  6.1   Hemoglobin 12.0 - 15.0 g/dL 11.5  12.5  10.9   Hematocrit 36.0 - 46.0 % 34.2  38.7  33.8   Platelets 150 - 400 K/uL 197  247  260        Latest Ref Rng & Units 03/08/2024   11:59 AM 01/15/2024   11:04 AM 03/23/2023    9:30 AM  CMP  Glucose 70 - 99 mg/dL 161  90  92   BUN 6 - 20 mg/dL 19  13  18    Creatinine 0.44 - 1.00 mg/dL 0.96  0.45  4.09   Sodium 135 - 145 mmol/L 136  136  136   Potassium 3.5 - 5.1 mmol/L 4.1  4.9  4.4   Chloride 98 - 111 mmol/L 106  103  103   CO2 22 - 32 mmol/L 22  21  29    Calcium  8.9 - 10.3 mg/dL 8.9  9.3  9.3   Total Protein 6.0 - 8.5 g/dL  7.5  7.8   Total Bilirubin 0.0 - 1.2 mg/dL  0.3  0.5   Alkaline Phos 44 - 121 IU/L  78  62   AST 0 - 40 IU/L  13  14   ALT 0 - 32 IU/L  15  16     RADIOGRAPHIC STUDIES: I have personally reviewed the radiological images as listed and agreed with the findings in the report. CT Renal Stone Study Result Date: 03/08/2024 CLINICAL DATA:  Abdominal/flank pain, stone suspected EXAM: CT ABDOMEN AND PELVIS WITHOUT CONTRAST TECHNIQUE: Multidetector CT imaging of the abdomen and pelvis was performed following the standard protocol without IV contrast. RADIATION DOSE REDUCTION: This exam was performed according to the departmental dose-optimization program which includes automated exposure control, adjustment of the mA and/or kV according to patient size and/or use of iterative reconstruction technique. COMPARISON:  None Available. FINDINGS: Lower chest: No pleural or pericardial effusion. Visualized lung bases  clear. Hepatobiliary: No focal liver abnormality is seen. No gallstones, gallbladder wall thickening, or biliary dilatation. Pancreas: Unremarkable. No pancreatic ductal dilatation or surrounding inflammatory changes. Spleen: Normal in size without focal abnormality. Adrenals/Urinary Tract: No adrenal mass. Bilateral urolithiasis, largest staghorn on the left 3.8 cm extending to the central renal collecting system, along with smaller peripheral calculi with mild dilatation of the lower pole collecting system. On the right, there is a 5 mm lower pole peripheral calculus without hydronephrosis. Urinary bladder incompletely distended. Stomach/Bowel: Stomach is partially distended without acute finding. Small bowel nondilated. Appendix not visualized. Colon is partially distended, without acute finding. Vascular/Lymphatic: No significant vascular findings are present. No enlarged abdominal or pelvic lymph nodes. Reproductive: Enlarged globular uterus suggesting fibroids. No adnexal mass. Other: No ascites.  No free air. Musculoskeletal: Degenerative disc disease L5-S1, with apparent fusion across posterior elements. IMPRESSION: 1. 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 2. Enlarged globular uterus suggesting fibroids. Electronically Signed   By: Nicoletta Barrier M.D.   On: 03/08/2024 15:48    ASSESSMENT & PLAN Rebecca Sharp is a 53 y.o. female returns for a follow up for iron deficiency anemia.   #Iron deficiency anemia:  --Secondary to menstrual bleeding but recommend endoscopic evaluation to rule out GI source.  --Currently on ferrous sulfate  325 mg once a day. --Last received IV feraheme  510 mg x 2 doses from 02/05/2024-02/19/2024 --Labs today shows mild anemia with Hgb 11.5, MCV 87.9. Iron panel shows no deficiency with ferritin 327, iron 95, TIBC 304, saturaiton 31% --No need for additional IV iron at this time --RTC in  3 months for lab only check and 6  months with labs/follow up  No orders of the defined types were placed in this encounter.   All questions were answered. The patient knows to call the clinic with any problems, questions or concerns.  I have spent a total of 25 minutes minutes of face-to-face and non-face-to-face time, preparing to see the patient,performing a medically appropriate examination, counseling and educating the patient, documenting clinical information in the electronic health record and care coordination.   Wyline Hearing, PA-C Department of Hematology/Oncology [facility] Phone: [phone]

## 2024-04-02 ENCOUNTER — Ambulatory Visit: Payer: Self-pay

## 2024-04-02 NOTE — Telephone Encounter (Signed)
-----   Message from Darilyn Edin sent at 04/02/2024  6:41 AM EDT ----- Please notify patient that iron levels have improved with IV iron. No need for additional IV iron at this time.  ----- Message ----- From: Dannis Dy, Lab In [place] Sent: 04/01/2024  10:14 AM EDT To: Darilyn Edin, PA-C

## 2024-04-02 NOTE — Telephone Encounter (Signed)
 Pt advised via interpreter/Julie

## 2024-04-15 ENCOUNTER — Other Ambulatory Visit: Payer: Self-pay

## 2024-04-15 ENCOUNTER — Encounter: Payer: Self-pay | Admitting: Physician Assistant

## 2024-05-02 ENCOUNTER — Ambulatory Visit: Payer: Self-pay

## 2024-05-02 NOTE — Telephone Encounter (Signed)
 noted

## 2024-05-02 NOTE — Telephone Encounter (Signed)
 FYI Only or Action Required?: FYI only for provider.  Patient was last seen in primary care on 01/15/2024 by Theotis Haze ORN, NP.  Called Nurse Triage reporting Vaginal Bleeding.  Symptoms began 7/4.  Interventions attempted: Nothing.  Symptoms are: gradually improving.  Triage Disposition: See PCP Within 2 Weeks  Patient/caregiver understands and will follow disposition?: Yes  **Patient offered appt. Soonest available is 8/14, she will reach out to oncologist for advice, if she is unable to reach provider, she is be seen in UC**                       Message from Basehor L sent at 05/02/2024  3:04 PM EDT  Patient concerned as she has had her period since 04/18/24. Patient states it does not go on this long.  Patient requesting an appointment with provider   Reason for Disposition  Periods last > 7 days  Answer Assessment - Initial Assessment Questions 1. BLEEDING SEVERITY: Describe the bleeding that you are having. How much bleeding is there?      Menstrual cycle still ongoing   2. ONSET: When did the bleeding begin? Is it continuing now?     Bleeding since 7/4  3. MENSTRUAL PERIOD: When was the last normal menstrual period? How is this different than your period?      Bleeding as subsided, mild bleeding present now  4. REGULARITY: How regular are your periods?     No   5. ABDOMEN PAIN: Do you have any pain? How bad is the pain?  (e.g., Scale 0-10; none, mild, moderate, or severe)     No  6. PREGNANCY: Is there any chance you are pregnant? When was your last menstrual period?     N/A       8. HORMONE MEDICINES: Are you taking any hormone medicines, prescription or over-the-counter? (e.g., birth control pills, estrogen)  No      9. BLOOD THINNER MEDICINES: Do you take any blood thinners? (e.g., Coumadin / warfarin, Pradaxa / dabigatran, aspirin)     No   10. CAUSE: What do you think is causing the bleeding? (e.g.,  recent gyn surgery, recent gyn procedure; known bleeding disorder, cervical cancer, polycystic ovarian disease, fibroids)    Unknown        11. HEMODYNAMIC STATUS: Are you weak or feeling lightheaded? If Yes, ask: Can you stand and walk normally?   No         12. OTHER SYMPTOMS: What other symptoms are you having with the bleeding? (e.g., passed tissue, vaginal discharge, fever, menstrual-type cramps)  Not currently passing clots, did a few days ago  Protocols used: Vaginal Bleeding - Abnormal-A-AH

## 2024-05-07 ENCOUNTER — Encounter: Payer: Self-pay | Admitting: Physician Assistant

## 2024-06-06 ENCOUNTER — Telehealth: Payer: Self-pay | Admitting: Nurse Practitioner

## 2024-06-06 NOTE — Telephone Encounter (Signed)
 Contacted pt to confirmed appt no vm to leave a message

## 2024-06-09 ENCOUNTER — Encounter: Payer: Self-pay | Admitting: Physician Assistant

## 2024-06-09 ENCOUNTER — Other Ambulatory Visit: Payer: Self-pay

## 2024-06-09 ENCOUNTER — Encounter: Payer: Self-pay | Admitting: Nurse Practitioner

## 2024-06-09 ENCOUNTER — Ambulatory Visit: Payer: Self-pay | Attending: Nurse Practitioner | Admitting: Nurse Practitioner

## 2024-06-09 VITALS — BP 132/82 | HR 78 | Resp 18 | Ht 60.0 in | Wt 190.4 lb

## 2024-06-09 DIAGNOSIS — R7303 Prediabetes: Secondary | ICD-10-CM

## 2024-06-09 DIAGNOSIS — L80 Vitiligo: Secondary | ICD-10-CM

## 2024-06-09 DIAGNOSIS — Z0000 Encounter for general adult medical examination without abnormal findings: Secondary | ICD-10-CM

## 2024-06-09 DIAGNOSIS — D649 Anemia, unspecified: Secondary | ICD-10-CM

## 2024-06-09 DIAGNOSIS — Z1231 Encounter for screening mammogram for malignant neoplasm of breast: Secondary | ICD-10-CM

## 2024-06-09 DIAGNOSIS — I10 Essential (primary) hypertension: Secondary | ICD-10-CM

## 2024-06-09 MED ORDER — LISINOPRIL 5 MG PO TABS
5.0000 mg | ORAL_TABLET | Freq: Every day | ORAL | 1 refills | Status: AC
  Filled 2024-06-09: qty 90, 90d supply, fill #0
  Filled 2024-11-12: qty 90, 90d supply, fill #1

## 2024-06-09 MED ORDER — METFORMIN HCL 500 MG PO TABS
500.0000 mg | ORAL_TABLET | Freq: Two times a day (BID) | ORAL | 1 refills | Status: AC
  Filled 2024-06-09: qty 180, 90d supply, fill #0
  Filled 2024-11-12: qty 180, 90d supply, fill #1

## 2024-06-09 MED ORDER — FERROUS SULFATE 325 (65 FE) MG PO TABS
325.0000 mg | ORAL_TABLET | Freq: Every day | ORAL | 2 refills | Status: AC
  Filled 2024-06-09: qty 90, 90d supply, fill #0
  Filled 2024-11-12: qty 90, 90d supply, fill #1

## 2024-06-09 MED ORDER — BETAMETHASONE DIPROPIONATE 0.05 % EX CREA
TOPICAL_CREAM | Freq: Two times a day (BID) | CUTANEOUS | 1 refills | Status: AC
  Filled 2024-06-09 – 2024-06-23 (×2): qty 60, 30d supply, fill #0

## 2024-06-09 NOTE — Progress Notes (Signed)
 Assessment & Plan:  Rebecca Sharp was seen today for annual exam.  Diagnoses and all orders for this visit:  Encounter for annual physical exam -     CBC with Differential -     Iron, TIBC and Ferritin Panel -     CMP14+EGFR -     Lipid panel  Breast cancer screening by mammogram -     Cancel: MS 3D SCR MAMMO BILAT BR (aka MM); Future  Prediabetes -     Hemoglobin A1c -     metFORMIN  (GLUCOPHAGE ) 500 MG tablet; Take 1 tablet (500 mg total) by mouth 2 (two) times daily with a meal.  Vitiligo -     betamethasone  dipropionate 0.05 % cream; APPLY TOPICALLY 2 (TWO) TIMES DAILY.  Essential hypertension -     lisinopril  (ZESTRIL ) 5 MG tablet; Take 1 tablet (5 mg total) by mouth daily.  Anemia, unspecified type -     ferrous sulfate  (FEROSUL) 325 (65 FE) MG tablet; Take 1 tablet (325 mg total) by mouth daily with breakfast.    Patient has been counseled on age-appropriate routine health concerns for screening and prevention. These are reviewed and up-to-date. Referrals have been placed accordingly. Immunizations are up-to-date or declined.    Subjective:   Chief Complaint  Patient presents with   Annual Exam    Rebecca Sharp 53 y.o. female presents to office today for annual physical exam.      Review of Systems  Constitutional:  Negative for fever, malaise/fatigue and weight loss.  HENT: Negative.  Negative for nosebleeds.   Eyes: Negative.  Negative for blurred vision, double vision and photophobia.  Respiratory: Negative.  Negative for cough and shortness of breath.   Cardiovascular: Negative.  Negative for chest pain, palpitations and leg swelling.  Gastrointestinal: Negative.  Negative for heartburn, nausea and vomiting.  Genitourinary: Negative.   Musculoskeletal: Negative.  Negative for myalgias.  Skin: Negative.   Neurological: Negative.  Negative for dizziness, focal weakness, seizures and headaches.  Endo/Heme/Allergies: Negative.   Psychiatric/Behavioral:  Negative.  Negative for suicidal ideas.     Past Medical History:  Diagnosis Date   AMA (advanced maternal age) multigravida 35+    Anemia    Ganglion cyst 01/25/2017   Left ankle   Kidney stones 2013   With pregnancy    Language barrier    Obese    Obesity 01/25/2017   Pregnancy induced hypertension    at end of last 2 pregnancies    Past Surgical History:  Procedure Laterality Date   NO PAST SURGERIES      Family History  Problem Relation Age of Onset   Diabetes Father    Hypertension Father    Breast cancer Neg Hx     Social History Reviewed with no changes to be made today.   Outpatient Medications Prior to Visit  Medication Sig Dispense Refill   Blood Glucose Monitoring Suppl (TRUE METRIX METER) w/Device KIT 1 each by Does not apply route 2 (two) times daily. 1 kit 0   glucose blood (TRUE METRIX BLOOD GLUCOSE TEST) test strip Use as instructed 100 each 12   nystatin  (MYCOSTATIN /NYSTOP ) powder Apply 1 Application topically 3 (three) times daily. 30 g 2   TRUEplus Lancets 28G MISC USE AS DIRECTED 2 (TWO) TIMES DAILY. 100 each 12   betamethasone  dipropionate 0.05 % cream APPLY TOPICALLY 2 (TWO) TIMES DAILY. 60 g 1   ferrous sulfate  (FEROSUL) 325 (65 FE) MG tablet Take 1 tablet (325 mg  total) by mouth daily with breakfast. 90 tablet 2   ibuprofen  (ADVIL ) 600 MG tablet Take 1 tablet (600 mg total) by mouth every 6 (six) hours as needed. 30 tablet 0   metFORMIN  (GLUCOPHAGE ) 500 MG tablet Take 1 tablet (500 mg total) by mouth 2 (two) times daily with a meal. 180 tablet 1   erythromycin  ophthalmic ointment Place a 1/2 inch ribbon of ointment into the left lower eyelid 4x daily for 5 days. (Patient not taking: Reported on 06/09/2024) 3.5 g 0   lisinopril  (ZESTRIL ) 5 MG tablet Take 1 tablet (5 mg total) by mouth daily. (Patient not taking: Reported on 06/09/2024) 90 tablet 1   methocarbamol  (ROBAXIN ) 500 MG tablet Take 1 tablet (500 mg total) by mouth 2 (two) times daily.  (Patient not taking: Reported on 06/09/2024) 20 tablet 0   predniSONE  (DELTASONE ) 20 MG tablet Take 2 tablets (40 mg total) by mouth daily. (Patient not taking: Reported on 06/09/2024) 6 tablet 0   No facility-administered medications prior to visit.    No Known Allergies     Objective:    BP 132/82 (BP Location: Left Arm, Patient Position: Sitting, Cuff Size: Normal)   Pulse 78   Resp 18   Ht 5' (1.524 m)   Wt 190 lb 6.4 oz (86.4 kg)   SpO2 99%   BMI 37.18 kg/m  Wt Readings from Last 3 Encounters:  06/09/24 190 lb 6.4 oz (86.4 kg)  04/01/24 184 lb 11.2 oz (83.8 kg)  03/08/24 180 lb (81.6 kg)    Physical Exam Constitutional:      Appearance: She is well-developed.  HENT:     Head: Normocephalic and atraumatic.     Right Ear: Hearing, tympanic membrane, ear canal and external ear normal.     Left Ear: Hearing, tympanic membrane, ear canal and external ear normal.     Nose: Nose normal.     Right Turbinates: Not enlarged.     Left Turbinates: Not enlarged.     Mouth/Throat:     Lips: Pink.     Mouth: Mucous membranes are moist.     Dentition: No dental tenderness, gingival swelling, dental abscesses or gum lesions.     Pharynx: No oropharyngeal exudate.  Eyes:     General: No scleral icterus.       Right eye: No discharge.     Extraocular Movements: Extraocular movements intact.     Conjunctiva/sclera: Conjunctivae normal.     Pupils: Pupils are equal, round, and reactive to light.  Neck:     Thyroid: No thyromegaly.     Trachea: No tracheal deviation.  Cardiovascular:     Rate and Rhythm: Normal rate and regular rhythm.     Heart sounds: Normal heart sounds. No murmur heard.    No friction rub.  Pulmonary:     Effort: Pulmonary effort is normal. No accessory muscle usage or respiratory distress.     Breath sounds: Normal breath sounds. No decreased breath sounds, wheezing, rhonchi or rales.  Abdominal:     General: Bowel sounds are normal. There is no distension.      Palpations: Abdomen is soft. There is no mass.     Tenderness: There is no abdominal tenderness. There is no right CVA tenderness, left CVA tenderness, guarding or rebound.     Hernia: No hernia is present.  Musculoskeletal:        General: No tenderness or deformity. Normal range of motion.     Cervical back:  Normal range of motion and neck supple.  Lymphadenopathy:     Cervical: No cervical adenopathy.  Skin:    General: Skin is warm and dry.     Findings: No erythema.  Neurological:     Mental Status: She is alert and oriented to person, place, and time.     Cranial Nerves: No cranial nerve deficit.     Motor: Motor function is intact.     Coordination: Coordination is intact. Coordination normal.     Gait: Gait is intact.     Deep Tendon Reflexes:     Reflex Scores:      Patellar reflexes are 1+ on the right side and 1+ on the left side. Psychiatric:        Attention and Perception: Attention normal.        Mood and Affect: Mood normal.        Speech: Speech normal.        Behavior: Behavior normal.        Thought Content: Thought content normal.        Judgment: Judgment normal.          Patient has been counseled extensively about nutrition and exercise as well as the importance of adherence with medications and regular follow-up. The patient was given clear instructions to go to ER or return to medical center if symptoms don't improve, worsen or new problems develop. The patient verbalized understanding.   Follow-up: Return in about 6 months (around 12/10/2024).   Haze LELON Servant, FNP-BC [facility] [place], [place] [phone]   06/23/2024, 10:14 PM

## 2024-06-10 ENCOUNTER — Other Ambulatory Visit: Payer: Self-pay

## 2024-06-10 ENCOUNTER — Ambulatory Visit: Payer: Self-pay | Admitting: Nurse Practitioner

## 2024-06-10 LAB — CMP14+EGFR
ALT: 15 IU/L (ref 0–32)
AST: 17 IU/L (ref 0–40)
Albumin: 4.4 g/dL (ref 3.8–4.9)
Alkaline Phosphatase: 71 IU/L (ref 44–121)
BUN/Creatinine Ratio: 21 (ref 9–23)
BUN: 16 mg/dL (ref 6–24)
Bilirubin Total: 0.2 mg/dL (ref 0.0–1.2)
CO2: 22 mmol/L (ref 20–29)
Calcium: 9.5 mg/dL (ref 8.7–10.2)
Chloride: 102 mmol/L (ref 96–106)
Creatinine, Ser: 0.76 mg/dL (ref 0.57–1.00)
Globulin, Total: 3 g/dL (ref 1.5–4.5)
Glucose: 87 mg/dL (ref 70–99)
Potassium: 4.9 mmol/L (ref 3.5–5.2)
Sodium: 138 mmol/L (ref 134–144)
Total Protein: 7.4 g/dL (ref 6.0–8.5)
eGFR: 95 mL/min/1.73 (ref >59)

## 2024-06-10 LAB — CBC WITH DIFFERENTIAL/PLATELET
Basophils Absolute: 0.1 x10E3/uL (ref 0.0–0.2)
Basos: 1 %
EOS (ABSOLUTE): 0.5 x10E3/uL — ABNORMAL HIGH (ref 0.0–0.4)
Eos: 7 %
Hematocrit: 35.4 % (ref 34.0–46.6)
Hemoglobin: 11.1 g/dL (ref 11.1–15.9)
Immature Grans (Abs): 0 x10E3/uL (ref 0.0–0.1)
Immature Granulocytes: 0 %
Lymphocytes Absolute: 1.2 x10E3/uL (ref 0.7–3.1)
Lymphs: 18 %
MCH: 28.9 pg (ref 26.6–33.0)
MCHC: 31.4 g/dL — ABNORMAL LOW (ref 31.5–35.7)
MCV: 92 fL (ref 79–97)
Monocytes Absolute: 0.4 x10E3/uL (ref 0.1–0.9)
Monocytes: 6 %
Neutrophils Absolute: 4.8 x10E3/uL (ref 1.4–7.0)
Neutrophils: 68 %
Platelets: 279 x10E3/uL (ref 150–450)
RBC: 3.84 x10E6/uL (ref 3.77–5.28)
RDW: 13.7 % (ref 11.7–15.4)
WBC: 7 x10E3/uL (ref 3.4–10.8)

## 2024-06-10 LAB — LIPID PANEL
Chol/HDL Ratio: 5.9 ratio — ABNORMAL HIGH (ref 0.0–4.4)
Cholesterol, Total: 195 mg/dL (ref 100–199)
HDL: 33 mg/dL — ABNORMAL LOW (ref >39)
LDL Chol Calc (NIH): 93 mg/dL (ref 0–99)
Triglycerides: 418 mg/dL — ABNORMAL HIGH (ref 0–149)
VLDL Cholesterol Cal: 69 mg/dL — ABNORMAL HIGH (ref 5–40)

## 2024-06-10 LAB — IRON,TIBC AND FERRITIN PANEL
Ferritin: 97 ng/mL (ref 15–150)
Iron Saturation: 14 % — ABNORMAL LOW (ref 15–55)
Iron: 45 ug/dL (ref 27–159)
Total Iron Binding Capacity: 327 ug/dL (ref 250–450)
UIBC: 282 ug/dL (ref 131–425)

## 2024-06-10 LAB — HEMOGLOBIN A1C
Est. average glucose Bld gHb Est-mCnc: 103 mg/dL
Hgb A1c MFr Bld: 5.2 % (ref 4.8–5.6)

## 2024-06-12 ENCOUNTER — Other Ambulatory Visit: Payer: Self-pay | Admitting: Obstetrics and Gynecology

## 2024-06-12 DIAGNOSIS — Z1231 Encounter for screening mammogram for malignant neoplasm of breast: Secondary | ICD-10-CM

## 2024-06-18 ENCOUNTER — Other Ambulatory Visit: Payer: Self-pay

## 2024-06-20 ENCOUNTER — Other Ambulatory Visit: Payer: Self-pay

## 2024-06-23 ENCOUNTER — Other Ambulatory Visit: Payer: Self-pay

## 2024-06-23 ENCOUNTER — Encounter: Payer: Self-pay | Admitting: Physician Assistant

## 2024-07-03 ENCOUNTER — Inpatient Hospital Stay: Payer: Self-pay | Attending: Physician Assistant

## 2024-07-21 ENCOUNTER — Other Ambulatory Visit: Payer: Self-pay | Admitting: Hematology and Oncology

## 2024-07-21 ENCOUNTER — Inpatient Hospital Stay: Payer: Self-pay | Attending: Physician Assistant

## 2024-07-21 DIAGNOSIS — D508 Other iron deficiency anemias: Secondary | ICD-10-CM

## 2024-07-21 DIAGNOSIS — D509 Iron deficiency anemia, unspecified: Secondary | ICD-10-CM | POA: Insufficient documentation

## 2024-07-21 LAB — CBC WITH DIFFERENTIAL (CANCER CENTER ONLY)
Abs Immature Granulocytes: 0.02 K/uL (ref 0.00–0.07)
Basophils Absolute: 0 K/uL (ref 0.0–0.1)
Basophils Relative: 1 %
Eosinophils Absolute: 0.4 K/uL (ref 0.0–0.5)
Eosinophils Relative: 6 %
HCT: 34.9 % — ABNORMAL LOW (ref 36.0–46.0)
Hemoglobin: 11.4 g/dL — ABNORMAL LOW (ref 12.0–15.0)
Immature Granulocytes: 0 %
Lymphocytes Relative: 25 %
Lymphs Abs: 1.9 K/uL (ref 0.7–4.0)
MCH: 27.2 pg (ref 26.0–34.0)
MCHC: 32.7 g/dL (ref 30.0–36.0)
MCV: 83.3 fL (ref 80.0–100.0)
Monocytes Absolute: 0.4 K/uL (ref 0.1–1.0)
Monocytes Relative: 5 %
Neutro Abs: 4.7 K/uL (ref 1.7–7.7)
Neutrophils Relative %: 63 %
Platelet Count: 271 K/uL (ref 150–400)
RBC: 4.19 MIL/uL (ref 3.87–5.11)
RDW: 14.1 % (ref 11.5–15.5)
WBC Count: 7.5 K/uL (ref 4.0–10.5)
nRBC: 0 % (ref 0.0–0.2)

## 2024-07-21 LAB — IRON AND IRON BINDING CAPACITY (CC-WL,HP ONLY)
Iron: 37 ug/dL (ref 28–170)
Saturation Ratios: 10 % — ABNORMAL LOW (ref 10.4–31.8)
TIBC: 385 ug/dL (ref 250–450)
UIBC: 348 ug/dL (ref 148–442)

## 2024-07-21 LAB — RETIC PANEL
Immature Retic Fract: 9.6 % (ref 2.3–15.9)
RBC.: 4.16 MIL/uL (ref 3.87–5.11)
Retic Count, Absolute: 44.9 K/uL (ref 19.0–186.0)
Retic Ct Pct: 1.1 % (ref 0.4–3.1)
Reticulocyte Hemoglobin: 29.2 pg (ref >27.9)

## 2024-07-21 LAB — CMP (CANCER CENTER ONLY)
ALT: 18 U/L (ref 0–44)
AST: 17 U/L (ref 15–41)
Albumin: 4.3 g/dL (ref 3.5–5.0)
Alkaline Phosphatase: 64 U/L (ref 38–126)
Anion gap: 8 (ref 5–15)
BUN: 19 mg/dL (ref 6–20)
CO2: 26 mmol/L (ref 22–32)
Calcium: 9.7 mg/dL (ref 8.9–10.3)
Chloride: 105 mmol/L (ref 98–111)
Creatinine: 0.85 mg/dL (ref 0.44–1.00)
GFR, Estimated: >60 mL/min (ref >60)
Glucose, Bld: 119 mg/dL — ABNORMAL HIGH (ref 70–99)
Potassium: 3.9 mmol/L (ref 3.5–5.1)
Sodium: 139 mmol/L (ref 135–145)
Total Bilirubin: 0.3 mg/dL (ref 0.0–1.2)
Total Protein: 7.8 g/dL (ref 6.5–8.1)

## 2024-07-21 LAB — FERRITIN: Ferritin: 91 ng/mL (ref 11–307)

## 2024-08-05 ENCOUNTER — Other Ambulatory Visit: Payer: Self-pay

## 2024-08-05 ENCOUNTER — Encounter: Payer: Self-pay | Admitting: Physician Assistant

## 2024-08-05 ENCOUNTER — Ambulatory Visit: Payer: Self-pay

## 2024-08-14 ENCOUNTER — Telehealth: Payer: Self-pay

## 2024-08-14 NOTE — Telephone Encounter (Signed)
 Attempted to reach patient via [facility] [employer] [phone] about [facility] appt on 11/13. Patient does not have a voicemail set up. Unable to leave message. Will attempt to reach the patient at a later time.

## 2024-08-28 ENCOUNTER — Ambulatory Visit: Payer: Self-pay

## 2024-09-30 ENCOUNTER — Other Ambulatory Visit: Payer: Self-pay | Admitting: Physician Assistant

## 2024-09-30 DIAGNOSIS — D508 Other iron deficiency anemias: Secondary | ICD-10-CM

## 2024-09-30 NOTE — Progress Notes (Unsigned)
 [facility] [phone]   [phone]  PROGRESS NOTE  Patient Care Team: Theotis Haze ORN, NP as PCP - General (Nurse Practitioner)  CHIEF COMPLAINTS/PURPOSE OF CONSULTATION:  Iron deficiency anemia  TREATMENT HISTORY: --Currently on ferrous sulfate  325 mg once daily --Received IV monoferric  1000 mg x 1 dose on 07/10/2022 --Received IV monoferric  1000 mg x 1 dose on 01/01/2023 --Received IV feraheme  510 mg x 2 doses from 02/05/2024-02/19/2024.    HISTORY OF PRESENTING ILLNESS:  Rebecca Sharp 53 y.o. female returns for a follow up visit for iron deficiency anemia.  She is accompanied by a Engineer, structural. She was last seen on 04/01/2024 and the interim, she received IV feraheme  x 2 doses.   On exam today, Ms. Sprung reports ***  MEDICAL HISTORY:  Past Medical History:  Diagnosis Date   AMA (advanced maternal age) multigravida 35+    Anemia    Ganglion cyst 01/25/2017   Left ankle   Kidney stones 2013   With pregnancy    Language barrier    Obese    Obesity 01/25/2017   Pregnancy induced hypertension    at end of last 2 pregnancies    SURGICAL HISTORY: Past Surgical History:  Procedure Laterality Date   NO PAST SURGERIES      SOCIAL HISTORY: Social History   Socioeconomic History   Marital status: Single    Spouse name: Not on file   Number of children: 3   Years of education: <8th grade   Highest education level: 6th grade  Occupational History   Occupation: Multimedia Programmer: [employer]  Tobacco Use   Smoking status: Never   Smokeless tobacco: Never  Vaping Use   Vaping status: Never Used  Substance and Sexual Activity   Alcohol use: No    Alcohol/week: 0.0 standard drinks of alcohol   Drug use: No   Sexual activity: Yes    Birth control/protection: Condom, None    Comment: No current boyfriend or spouse-not interested  Other Topics Concern   Not on file  Social History Narrative   Not on file   Social  Drivers of Health   Tobacco Use: Low Risk (06/09/2024)   Patient History    Smoking Tobacco Use: Never    Smokeless Tobacco Use: Never    Passive Exposure: Not on file  Financial Resource Strain: Not on file  Food Insecurity: No Food Insecurity (08/09/2023)   Hunger Vital Sign    Worried About Running Out of Food in the Last Year: Never true    Ran Out of Food in the Last Year: Never true  Transportation Needs: No Transportation Needs (06/15/2022)   PRAPARE - Administrator, Civil Service (Medical): No    Lack of Transportation (Non-Medical): No  Physical Activity: Not on file  Stress: Not on file  Social Connections: Not on file  Intimate Partner Violence: Not on file  Depression [phone]): Low Risk (06/09/2024)   Depression (PHQ2-9)    PHQ-2 Score: 0  Alcohol Screen: Not on file  Housing: Not on file  Utilities: Not on file  Health Literacy: Not on file    FAMILY HISTORY: Family History  Problem Relation Age of Onset   Diabetes Father    Hypertension Father    Breast cancer Neg Hx     ALLERGIES:  has no known allergies.  MEDICATIONS:  Current Outpatient Medications  Medication Sig Dispense Refill   betamethasone  dipropionate 0.05 % cream APPLY TOPICALLY  2 (TWO) TIMES DAILY. 60 g 1   Blood Glucose Monitoring Suppl (TRUE METRIX METER) w/Device KIT 1 each by Does not apply route 2 (two) times daily. 1 kit 0   ferrous sulfate  (FEROSUL) 325 (65 FE) MG tablet Take 1 tablet (325 mg total) by mouth daily with breakfast. 90 tablet 2   glucose blood (TRUE METRIX BLOOD GLUCOSE TEST) test strip Use as instructed 100 each 12   lisinopril  (ZESTRIL ) 5 MG tablet Take 1 tablet (5 mg total) by mouth daily. 90 tablet 1   metFORMIN  (GLUCOPHAGE ) 500 MG tablet Take 1 tablet (500 mg total) by mouth 2 (two) times daily with a meal. 180 tablet 1   nystatin  (MYCOSTATIN /NYSTOP ) powder Apply 1 Application topically 3 (three) times daily. 30 g 2   TRUEplus Lancets 28G MISC USE AS  DIRECTED 2 (TWO) TIMES DAILY. 100 each 12   No current facility-administered medications for this visit.    REVIEW OF SYSTEMS:   Constitutional: ( - ) fevers, ( - )  chills , ( - ) night sweats Eyes: ( - ) blurriness of vision, ( - ) double vision, ( - ) watery eyes Ears, nose, mouth, throat, and face: ( - ) mucositis, ( - ) sore throat Respiratory: ( - ) cough, ( - ) dyspnea, ( - ) wheezes Cardiovascular: ( - ) palpitation, ( - ) chest discomfort, ( - ) lower extremity swelling Gastrointestinal:  ( - ) nausea, ( - ) heartburn, ( - ) change in bowel habits Skin: ( - ) abnormal skin rashes Lymphatics: ( - ) new lymphadenopathy, ( - ) easy bruising Neurological: ( - ) numbness, ( - ) tingling, ( - ) new weaknesses Behavioral/Psych: ( - ) mood change, ( - ) new changes  All other systems were reviewed with the patient and are negative.  PHYSICAL EXAMINATION: ECOG PERFORMANCE STATUS: 0 - Asymptomatic  There were no vitals filed for this visit.  There were no vitals filed for this visit.   GENERAL: well appearing female in NAD  SKIN: skin color, texture, turgor are normal, no rashes or significant lesions EYES: conjunctiva are pink and non-injected, sclera clear OROPHARYNX: no exudate, no erythema; lips, buccal mucosa, and tongue normal  LUNGS: clear to auscultation and percussion with normal breathing effort HEART: regular rate & rhythm and no murmurs and no lower extremity edema Musculoskeletal: no cyanosis of digits and no clubbing  PSYCH: alert & oriented x 3, fluent speech NEURO: no focal motor/sensory deficits  LABORATORY DATA:  I have reviewed the data as listed    Latest Ref Rng & Units 07/21/2024    2:22 PM 06/09/2024   11:10 AM 04/01/2024   10:08 AM  CBC  WBC 4.0 - 10.5 K/uL 7.5  7.0  7.0   Hemoglobin 12.0 - 15.0 g/dL 88.5  88.8  88.4   Hematocrit 36.0 - 46.0 % 34.9  35.4  34.2   Platelets 150 - 400 K/uL 271  279  197        Latest Ref Rng & Units 07/21/2024     2:22 PM 06/09/2024   11:10 AM 03/08/2024   11:59 AM  CMP  Glucose 70 - 99 mg/dL 880  87  880   BUN 6 - 20 mg/dL 19  16  19    Creatinine 0.44 - 1.00 mg/dL 9.14  9.23  9.30   Sodium 135 - 145 mmol/L 139  138  136   Potassium 3.5 - 5.1 mmol/L 3.9  4.9  4.1   Chloride 98 - 111 mmol/L 105  102  106   CO2 22 - 32 mmol/L 26  22  22    Calcium  8.9 - 10.3 mg/dL 9.7  9.5  8.9   Total Protein 6.5 - 8.1 g/dL 7.8  7.4    Total Bilirubin 0.0 - 1.2 mg/dL 0.3  0.2    Alkaline Phos 38 - 126 U/L 64  71    AST 15 - 41 U/L 17  17    ALT 0 - 44 U/L 18  15      RADIOGRAPHIC STUDIES: I have personally reviewed the radiological images as listed and agreed with the findings in the report. No results found.   ASSESSMENT & PLAN Rebecca Sharp is a 53 y.o. female returns for a follow up for iron deficiency anemia.   #Iron deficiency anemia:  --Secondary to menstrual bleeding but recommend endoscopic evaluation to rule out GI source.  --Currently on ferrous sulfate  325 mg once a day. --Last received IV feraheme  510 mg x 2 doses from 02/05/2024-02/19/2024 --Labs today shows *** --No need for additional IV iron at this time --RTC in 3 months for lab only check and 6 months with labs/follow up  No orders of the defined types were placed in this encounter.   All questions were answered. The patient knows to call the clinic with any problems, questions or concerns.  I have spent a total of 25 minutes minutes of face-to-face and non-face-to-face time, preparing to see the patient,performing a medically appropriate examination, counseling and educating the patient, documenting clinical information in the electronic health record and care coordination.   Johnston Police, PA-C Department of Hematology/Oncology [facility] Phone: [phone]

## 2024-10-01 ENCOUNTER — Inpatient Hospital Stay: Payer: Self-pay | Attending: Physician Assistant

## 2024-10-01 ENCOUNTER — Inpatient Hospital Stay (HOSPITAL_BASED_OUTPATIENT_CLINIC_OR_DEPARTMENT_OTHER): Payer: Self-pay | Admitting: Physician Assistant

## 2024-10-01 VITALS — BP 129/74 | HR 76 | Temp 97.5°F | Resp 16 | Ht 60.0 in | Wt 183.5 lb

## 2024-10-01 DIAGNOSIS — D508 Other iron deficiency anemias: Secondary | ICD-10-CM

## 2024-10-01 DIAGNOSIS — D509 Iron deficiency anemia, unspecified: Secondary | ICD-10-CM | POA: Insufficient documentation

## 2024-10-01 LAB — CMP (CANCER CENTER ONLY)
ALT: 20 U/L (ref 0–44)
AST: 19 U/L (ref 15–41)
Albumin: 4.4 g/dL (ref 3.5–5.0)
Alkaline Phosphatase: 71 U/L (ref 38–126)
Anion gap: 10 (ref 5–15)
BUN: 16 mg/dL (ref 6–20)
CO2: 25 mmol/L (ref 22–32)
Calcium: 9.3 mg/dL (ref 8.9–10.3)
Chloride: 103 mmol/L (ref 98–111)
Creatinine: 0.76 mg/dL (ref 0.44–1.00)
GFR, Estimated: >60 mL/min (ref >60)
Glucose, Bld: 76 mg/dL (ref 70–99)
Potassium: 4.2 mmol/L (ref 3.5–5.1)
Sodium: 139 mmol/L (ref 135–145)
Total Bilirubin: 0.3 mg/dL (ref 0.0–1.2)
Total Protein: 8 g/dL (ref 6.5–8.1)

## 2024-10-01 LAB — CBC WITH DIFFERENTIAL (CANCER CENTER ONLY)
Abs Immature Granulocytes: 0.02 K/uL (ref 0.00–0.07)
Basophils Absolute: 0 K/uL (ref 0.0–0.1)
Basophils Relative: 0 %
Eosinophils Absolute: 0.3 K/uL (ref 0.0–0.5)
Eosinophils Relative: 4 %
HCT: 34 % — ABNORMAL LOW (ref 36.0–46.0)
Hemoglobin: 11.1 g/dL — ABNORMAL LOW (ref 12.0–15.0)
Immature Granulocytes: 0 %
Lymphocytes Relative: 28 %
Lymphs Abs: 2 K/uL (ref 0.7–4.0)
MCH: 26.9 pg (ref 26.0–34.0)
MCHC: 32.6 g/dL (ref 30.0–36.0)
MCV: 82.5 fL (ref 80.0–100.0)
Monocytes Absolute: 0.4 K/uL (ref 0.1–1.0)
Monocytes Relative: 6 %
Neutro Abs: 4.4 K/uL (ref 1.7–7.7)
Neutrophils Relative %: 62 %
Platelet Count: 248 K/uL (ref 150–400)
RBC: 4.12 MIL/uL (ref 3.87–5.11)
RDW: 15.5 % (ref 11.5–15.5)
WBC Count: 7.1 K/uL (ref 4.0–10.5)
nRBC: 0 % (ref 0.0–0.2)

## 2024-10-01 LAB — IRON AND IRON BINDING CAPACITY (CC-WL,HP ONLY)
Iron: 55 ug/dL (ref 28–170)
Saturation Ratios: 15 % (ref 10.4–31.8)
TIBC: 377 ug/dL (ref 250–450)
UIBC: 322 ug/dL

## 2024-10-01 LAB — FERRITIN: Ferritin: 119 ng/mL (ref 11–307)

## 2024-10-02 ENCOUNTER — Ambulatory Visit: Payer: Self-pay | Admitting: *Deleted

## 2024-10-02 VITALS — BP 123/70 | Wt 180.4 lb

## 2024-10-02 DIAGNOSIS — Z1239 Encounter for other screening for malignant neoplasm of breast: Secondary | ICD-10-CM

## 2024-10-02 DIAGNOSIS — Z1211 Encounter for screening for malignant neoplasm of colon: Secondary | ICD-10-CM

## 2024-10-02 NOTE — Patient Instructions (Signed)
 Explained breast self awareness with Darren Arlee Sprung. Patient did not need a Pap smear today due to last Pap smear and HPV typing was 05/30/2021. Let her know BCCCP will cover Pap smears and HPV typing every 5 years unless has a history of abnormal Pap smears. Referred patient to the [facility] for a screening mammogram on mobile unit. Appointment scheduled Thursday, October 23, 2024 at 0920. Patient aware of appointment and will be there. Darren Arlee Sprung verbalized understanding.  Corgan Mormile, Wanda Ship, RN 10:27 AM

## 2024-10-02 NOTE — Progress Notes (Signed)
 Ms. Eugene Zeiders Jackson is a 53 y.o. female who presents to [facility] clinic today with no complaints.    Pap Smear: Pap smear not completed today. Last Pap smear was 05/30/2021 at [facility] clinic and was normal with negative HPV. Per patient has no history of an abnormal Pap smear. Last Pap smear result is available in Epic.   Physical exam: Breasts Right breast slightly larger than left breast that per patient is normal for her. No skin abnormalities bilateral breasts. No nipple retraction bilateral breasts. No nipple discharge bilateral breasts. No lymphadenopathy. No lumps palpated bilateral breasts. No complaints of pain or tenderness on exam.  MS 3D SCR MAMMO BILAT BR (aka MM) Result Date: 08/13/2023 CLINICAL DATA:  Screening. EXAM: DIGITAL SCREENING BILATERAL MAMMOGRAM WITH TOMOSYNTHESIS AND CAD TECHNIQUE: Bilateral screening digital craniocaudal and mediolateral oblique mammograms were obtained. Bilateral screening digital breast tomosynthesis was performed. The images were evaluated with computer-aided detection. COMPARISON:  Previous exam(s). ACR Breast Density Category c: The breasts are heterogeneously dense, which may obscure small masses. FINDINGS: There are no findings suspicious for malignancy. IMPRESSION: No mammographic evidence of malignancy. A result letter of this screening mammogram will be mailed directly to the patient. RECOMMENDATION: Screening mammogram in one year. (Code:SM-B-01Y) BI-RADS CATEGORY  1: Negative. Electronically Signed   By: Alm Parkins M.D.   On: 08/13/2023 14:01   MS DIGITAL SCREENING TOMO BILATERAL Result Date: 06/20/2022 CLINICAL DATA:  Screening. EXAM: DIGITAL SCREENING BILATERAL MAMMOGRAM WITH TOMOSYNTHESIS AND CAD TECHNIQUE: Bilateral screening digital craniocaudal and mediolateral oblique mammograms were obtained. Bilateral screening digital breast tomosynthesis was performed. The images were evaluated with computer-aided detection. COMPARISON:  Previous  exam(s). ACR Breast Density Category c: The breast tissue is heterogeneously dense, which may obscure small masses. FINDINGS: There are no findings suspicious for malignancy. IMPRESSION: No mammographic evidence of malignancy. A result letter of this screening mammogram will be mailed directly to the patient. RECOMMENDATION: Screening mammogram in one year. (Code:SM-B-01Y) BI-RADS CATEGORY  1: Negative. Electronically Signed   By: Almarie Daring M.D.   On: 06/20/2022 07:50   MS DIGITAL SCREENING TOMO BILATERAL Result Date: 06/15/2021 CLINICAL DATA:  Screening. EXAM: DIGITAL SCREENING BILATERAL MAMMOGRAM WITH TOMOSYNTHESIS AND CAD TECHNIQUE: Bilateral screening digital craniocaudal and mediolateral oblique mammograms were obtained. Bilateral screening digital breast tomosynthesis was performed. The images were evaluated with computer-aided detection. COMPARISON:  Previous exam(s). ACR Breast Density Category c: The breast tissue is heterogeneously dense, which may obscure small masses. FINDINGS: There are no findings suspicious for malignancy. IMPRESSION: No mammographic evidence of malignancy. A result letter of this screening mammogram will be mailed directly to the patient. RECOMMENDATION: Screening mammogram in one year. (Code:SM-B-01Y) BI-RADS CATEGORY  1: Negative. Electronically Signed   By: Leita Mattocks M.D.   On: 06/15/2021 15:53   MS DIGITAL SCREENING TOMO BILATERAL Result Date: 05/13/2020 CLINICAL DATA:  Screening. EXAM: DIGITAL SCREENING BILATERAL MAMMOGRAM WITH TOMO AND CAD COMPARISON:  Previous exam(s). ACR Breast Density Category c: The breast tissue is heterogeneously dense, which may obscure small masses. FINDINGS: There are no findings suspicious for malignancy. Images were processed with CAD. IMPRESSION: No mammographic evidence of malignancy. A result letter of this screening mammogram will be mailed directly to the patient. RECOMMENDATION: Screening mammogram in one year. (Code:SM-B-01Y)  BI-RADS CATEGORY  1: Negative. Electronically Signed   By: Debby Satterfield M.D.   On: 05/13/2020 12:46    Pelvic/Bimanual Pap is not indicated today per BCCCP guidelines.   Smoking History: Patient has never smoked.  Patient Navigation: Patient education provided. Access to services provided for patient through [place] program. Spanish interpreter Bernice Angry from [facility] provided.    Colorectal Cancer Screening: Per patient has never had colonoscopy completed. Per patient completed a FIT Test 08/21/2023 and negative. FIT test given to patient to complete. No complaints today.    Breast and Cervical Cancer Risk Assessment: Patient does not have family history of breast cancer, known genetic mutations, or radiation treatment to the chest before age 27. Patient does not have history of cervical dysplasia, immunocompromised, or DES exposure in-utero.  Risk Scores as of Encounter on 10/02/2024     [place]isa           5-year 0.94%   Lifetime 7.78%   This patient is [place]/Latina but has no documented birth country, so the [place] model used data from [place] patients to calculate their risk score. Document a birth country in the Demographics activity for a more accurate score.         Last calculated by Logan Lyle BRAVO, CMA on 10/02/2024 at 10:23 AM        A: BCCCP exam without pap smear No complaints.  P: Referred patient to the [facility] for a screening mammogram on mobile unit. Appointment scheduled Thursday, October 23, 2024 at 0920.  Driscilla Wanda SQUIBB, RN 10/02/2024 10:27 AM

## 2024-10-06 ENCOUNTER — Ambulatory Visit: Payer: Self-pay

## 2024-10-06 NOTE — Telephone Encounter (Signed)
-----   Message from Rebecca Sharp Police sent at 10/06/2024 10:53 AM EST ----- Please notify patient that iron levels are normal. No need for IV iron. Please continue iron pills.

## 2024-10-06 NOTE — Telephone Encounter (Signed)
Pt advised via Julie/interpreter

## 2024-10-23 ENCOUNTER — Ambulatory Visit
Admission: RE | Admit: 2024-10-23 | Discharge: 2024-10-23 | Disposition: A | Discharge status: Home / Self Care | Payer: Self-pay | Source: Ambulatory Visit | Attending: Obstetrics and Gynecology | Admitting: Obstetrics and Gynecology

## 2024-10-23 ENCOUNTER — Encounter: Payer: Self-pay | Admitting: Physician Assistant

## 2024-10-23 DIAGNOSIS — Z1231 Encounter for screening mammogram for malignant neoplasm of breast: Secondary | ICD-10-CM

## 2024-10-24 LAB — FECAL OCCULT BLOOD, IMMUNOCHEMICAL: Fecal Occult Bld: NEGATIVE

## 2024-10-27 NOTE — Progress Notes (Signed)
 Normal FIT Test letter mailed 10-27-24.

## 2024-11-12 ENCOUNTER — Other Ambulatory Visit: Payer: Self-pay

## 2024-11-12 ENCOUNTER — Encounter: Payer: Self-pay | Admitting: Physician Assistant

## 2024-11-13 ENCOUNTER — Other Ambulatory Visit: Payer: Self-pay

## 2024-12-15 ENCOUNTER — Ambulatory Visit: Payer: Self-pay | Admitting: Nurse Practitioner

## 2024-12-29 ENCOUNTER — Inpatient Hospital Stay: Payer: Self-pay

## 2025-03-30 ENCOUNTER — Inpatient Hospital Stay: Payer: Self-pay

## 2025-03-30 ENCOUNTER — Inpatient Hospital Stay: Payer: Self-pay | Admitting: Hematology and Oncology
